# Patient Record
Sex: Male | Born: 1945 | Race: White | Hispanic: No | Smoking: Former smoker
Health system: Southern US, Community
[De-identification: ages and names within clinical notes are randomized; demographics above are authoritative.]

## PROBLEM LIST (undated history)

## (undated) DIAGNOSIS — F419 Anxiety disorder, unspecified: Secondary | ICD-10-CM

## (undated) DIAGNOSIS — E785 Hyperlipidemia, unspecified: Secondary | ICD-10-CM

## (undated) DIAGNOSIS — L409 Psoriasis, unspecified: Secondary | ICD-10-CM

## (undated) DIAGNOSIS — I10 Essential (primary) hypertension: Secondary | ICD-10-CM

## (undated) DIAGNOSIS — M109 Gout, unspecified: Secondary | ICD-10-CM

## (undated) HISTORY — DX: Essential (primary) hypertension: I10

## (undated) HISTORY — PX: TONSILLECTOMY: SUR1361

## (undated) HISTORY — PX: VASECTOMY: SHX75

## (undated) HISTORY — DX: Hyperlipidemia, unspecified: E78.5

---

## 2013-04-13 ENCOUNTER — Ambulatory Visit (INDEPENDENT_AMBULATORY_CARE_PROVIDER_SITE_OTHER): Payer: Medicare Other | Admitting: Nurse Practitioner

## 2013-04-13 ENCOUNTER — Encounter: Payer: Self-pay | Admitting: Nurse Practitioner

## 2013-04-13 VITALS — BP 138/83 | HR 67 | Temp 97.0°F | Ht 73.0 in | Wt 294.0 lb

## 2013-04-13 DIAGNOSIS — E785 Hyperlipidemia, unspecified: Secondary | ICD-10-CM

## 2013-04-13 DIAGNOSIS — I10 Essential (primary) hypertension: Secondary | ICD-10-CM | POA: Insufficient documentation

## 2013-04-13 LAB — COMPLETE METABOLIC PANEL WITH GFR
AST: 22 U/L (ref 0–37)
Albumin: 4.4 g/dL (ref 3.5–5.2)
BUN: 7 mg/dL (ref 6–23)
Calcium: 9.9 mg/dL (ref 8.4–10.5)
Chloride: 95 mEq/L — ABNORMAL LOW (ref 96–112)
GFR, Est Non African American: 73 mL/min
Glucose, Bld: 99 mg/dL (ref 70–99)
Potassium: 4.6 mEq/L (ref 3.5–5.3)

## 2013-04-13 MED ORDER — PRAVASTATIN SODIUM 20 MG PO TABS: 20.0000 mg | ORAL_TABLET | Freq: Every day | ORAL | Status: DC

## 2013-04-13 MED ORDER — METOPROLOL SUCCINATE ER 50 MG PO TB24: 50.0000 mg | ORAL_TABLET | Freq: Every day | ORAL | Status: DC

## 2013-04-13 MED ORDER — LISINOPRIL-HYDROCHLOROTHIAZIDE 20-12.5 MG PO TABS: 1.0000 | ORAL_TABLET | Freq: Every day | ORAL | Status: DC

## 2013-04-13 NOTE — Progress Notes (Signed)
  Subjective:    Patient ID: EITAN DOUBLEDAY, male    DOB: Dec 12, 1946, 67 y.o.   MRN: [id number]  Hypertension This is a chronic problem. The current episode started more than 1 year ago. The problem is unchanged. The problem is controlled. Pertinent negatives include no blurred vision, chest pain, headaches, neck pain, orthopnea, palpitations, peripheral edema, shortness of breath or sweats. There are no associated agents to hypertension. Risk factors for coronary artery disease include obesity, male gender and dyslipidemia. Past treatments include ACE inhibitors, diuretics and beta blockers. The current treatment provides significant improvement. Compliance problems include diet and exercise.   Hyperlipidemia This is a chronic problem. The current episode started more than 1 year ago. The problem is uncontrolled. Recent lipid tests were reviewed and are high. There are no known factors aggravating his hyperlipidemia. Pertinent negatives include no chest pain, focal weakness, leg pain, myalgias or shortness of breath. Current antihyperlipidemic treatment includes statins. The current treatment provides moderate improvement of lipids. Compliance problems include adherence to diet and adherence to exercise.  Risk factors for coronary artery disease include hypertension, male sex and obesity.      Review of Systems  Constitutional: Negative.   HENT: Negative for neck pain.   Eyes: Negative.  Negative for blurred vision.  Respiratory: Negative for shortness of breath.   Cardiovascular: Negative for chest pain, palpitations and orthopnea.  Genitourinary: Negative.   Musculoskeletal: Negative.  Negative for myalgias.  Neurological: Negative for focal weakness and headaches.  Psychiatric/Behavioral: Negative.        Objective:   Physical Exam  Constitutional: He is oriented to person, place, and time. He appears well-developed and well-nourished.  HENT:  Head: Normocephalic.  Right Ear:  External ear normal.  Left Ear: External ear normal.  Nose: Nose normal.  Mouth/Throat: Oropharynx is clear and moist.  Eyes: EOM are normal. Pupils are equal, round, and reactive to light.  Neck: Normal range of motion. Neck supple. No JVD present. Carotid bruit is not present. No thyromegaly present.  Lipoma left proximal clavicle area. No nodules palpable. Nontender   Cardiovascular: Normal rate, regular rhythm, normal heart sounds and intact distal pulses.   No murmur heard. Pulmonary/Chest: Effort normal and breath sounds normal. He has no wheezes. He has no rales.  Abdominal: Soft. Bowel sounds are normal.  Musculoskeletal: Normal range of motion.  Neurological: He is alert and oriented to person, place, and time.  Skin: Skin is warm and dry.  Psychiatric: He has a normal mood and affect. His behavior is normal. Judgment and thought content normal.   BP 138/83  Pulse 67  Temp(Src) 97 F (36.1 C) (Oral)  Ht 6\' 1"  (1.854 m)  Wt 294 lb (133.358 kg)  BMI 38.8 kg/m2        Assessment & Plan:  1. Essential hypertension, benign Low Na+ diet - metoprolol succinate (TOPROL-XL) 50 MG 24 hr tablet; Take 1 tablet (50 mg total) by mouth daily. Take with or immediately following a meal.  Dispense: 90 tablet; Refill: 1 - lisinopril-hydrochlorothiazide (PRINZIDE,ZESTORETIC) 20-12.5 MG per tablet; Take 1 tablet by mouth daily.  Dispense: 90 tablet; Refill: 1 - COMPLETE METABOLIC PANEL WITH GFR  2. Hyperlipidemia with target LDL less than 100 Low fat diet and exercise - pravastatin (PRAVACHOL) 20 MG tablet; Take 1 tablet (20 mg total) by mouth daily.  Dispense: 90 tablet; Refill: 1 - NMR Lipoprofile with Lipids  Mary-Margaret Daphine Deutscher, FNP

## 2013-04-13 NOTE — Patient Instructions (Signed)

## 2013-04-14 LAB — NMR LIPOPROFILE WITH LIPIDS
Cholesterol, Total: 172 mg/dL
HDL Particle Number: 29 umol/L — ABNORMAL LOW
HDL Size: 8.9 nm — ABNORMAL LOW
HDL-C: 45 mg/dL
LDL (calc): 109 mg/dL — ABNORMAL HIGH
LDL Particle Number: 1465 nmol/L — ABNORMAL HIGH
LDL Size: 21.1 nm
LP-IR Score: 47 — ABNORMAL HIGH
Large HDL-P: 4.4 umol/L — ABNORMAL LOW
Large VLDL-P: 3.2 nmol/L — ABNORMAL HIGH
Small LDL Particle Number: 669 nmol/L — ABNORMAL HIGH
Triglycerides: 91 mg/dL
VLDL Size: 43.9 nm

## 2013-09-30 ENCOUNTER — Encounter (INDEPENDENT_AMBULATORY_CARE_PROVIDER_SITE_OTHER): Payer: Self-pay

## 2013-09-30 ENCOUNTER — Ambulatory Visit (INDEPENDENT_AMBULATORY_CARE_PROVIDER_SITE_OTHER): Payer: Medicare Other | Admitting: Nurse Practitioner

## 2013-09-30 ENCOUNTER — Encounter: Payer: Self-pay | Admitting: Nurse Practitioner

## 2013-09-30 VITALS — BP 146/91 | HR 60 | Temp 97.0°F | Ht 73.0 in | Wt 285.0 lb

## 2013-09-30 DIAGNOSIS — I10 Essential (primary) hypertension: Secondary | ICD-10-CM

## 2013-09-30 DIAGNOSIS — E785 Hyperlipidemia, unspecified: Secondary | ICD-10-CM

## 2013-09-30 DIAGNOSIS — Z125 Encounter for screening for malignant neoplasm of prostate: Secondary | ICD-10-CM

## 2013-09-30 MED ORDER — METOPROLOL SUCCINATE ER 50 MG PO TB24: 50.0000 mg | ORAL_TABLET | Freq: Every day | ORAL | Status: DC

## 2013-09-30 MED ORDER — LISINOPRIL-HYDROCHLOROTHIAZIDE 20-12.5 MG PO TABS: 1.0000 | ORAL_TABLET | Freq: Every day | ORAL | Status: DC

## 2013-09-30 MED ORDER — PRAVASTATIN SODIUM 20 MG PO TABS: 20.0000 mg | ORAL_TABLET | Freq: Every day | ORAL | Status: DC

## 2013-09-30 NOTE — Progress Notes (Signed)
Subjective:    Patient ID: EBONY RICKEL, male    DOB: 03-29-1946, 67 y.o.   MRN: [id number]  Hypertension This is a chronic problem. The current episode started more than 1 year ago. The problem is unchanged. The problem is controlled. Pertinent negatives include no blurred vision, headaches, neck pain, orthopnea, peripheral edema or sweats. There are no associated agents to hypertension. Risk factors for coronary artery disease include obesity, male gender and dyslipidemia. Past treatments include ACE inhibitors, diuretics and beta blockers. The current treatment provides significant improvement. Compliance problems include diet and exercise.   Hyperlipidemia This is a chronic problem. The current episode started more than 1 year ago. The problem is uncontrolled. Recent lipid tests were reviewed and are high. There are no known factors aggravating his hyperlipidemia. Pertinent negatives include no focal weakness, leg pain or myalgias. Current antihyperlipidemic treatment includes statins. The current treatment provides moderate improvement of lipids. Compliance problems include adherence to diet and adherence to exercise.  Risk factors for coronary artery disease include hypertension, male sex and obesity.  Atrial Fibrillation Symptoms include hypertension. Past medical history includes atrial fibrillation and hyperlipidemia.      Review of Systems  Constitutional: Negative.   Eyes: Negative.  Negative for blurred vision.  Cardiovascular: Negative for orthopnea.  Genitourinary: Negative.   Musculoskeletal: Negative.  Negative for myalgias and neck pain.  Neurological: Negative for focal weakness and headaches.  Psychiatric/Behavioral: Negative.        Objective:   Physical Exam  Constitutional: He is oriented to person, place, and time. He appears well-developed and well-nourished.  HENT:  Head: Normocephalic.  Right Ear: External ear normal.  Left Ear: External ear normal.  Nose:  Nose normal.  Mouth/Throat: Oropharynx is clear and moist.  Eyes: EOM are normal. Pupils are equal, round, and reactive to light.  Neck: Normal range of motion. Neck supple. No JVD present. Carotid bruit is not present. No thyromegaly present.  Lipoma left proximal clavicle area. No nodules palpable. Nontender   Cardiovascular: Normal rate, regular rhythm, normal heart sounds and intact distal pulses.   No murmur heard. Pulmonary/Chest: Effort normal and breath sounds normal. He has no wheezes. He has no rales.  Abdominal: Soft. Bowel sounds are normal.  Genitourinary:  Refuses rectal exam   Musculoskeletal: Normal range of motion.  Neurological: He is alert and oriented to person, place, and time.  Skin: Skin is warm and dry.  Psychiatric: He has a normal mood and affect. His behavior is normal. Judgment and thought content normal.   BP 146/91  Pulse 60  Temp(Src) 97 F (36.1 C) (Oral)  Ht 6\' 1"  (1.854 m)  Wt 285 lb (129.275 kg)  BMI 37.61 kg/m2        Assessment & Plan:   1. Essential hypertension, benign   2. Hyperlipidemia with target LDL less than 100   3. Prostate cancer screening    Orders Placed This Encounter  Procedures  . CMP14+EGFR  . NMR, lipoprofile  . PSA, total and free   Meds ordered this encounter  Medications  . metoprolol succinate (TOPROL-XL) 50 MG 24 hr tablet    Sig: Take 1 tablet (50 mg total) by mouth daily. Take with or immediately following a meal.    Dispense:  90 tablet    Refill:  1    Order Specific Question:  Supervising Provider    Answer:  Ernestina Penna [[id number]]  . pravastatin (PRAVACHOL) 20 MG tablet    Sig: Take  1 tablet (20 mg total) by mouth daily.    Dispense:  90 tablet    Refill:  1    Order Specific Question:  Supervising Provider    Answer:  Ernestina Penna [[id number]]  . lisinopril-hydrochlorothiazide (PRINZIDE,ZESTORETIC) 20-12.5 MG per tablet    Sig: Take 1 tablet by mouth daily.    Dispense:  90 tablet    Refill:  1     Order Specific Question:  Supervising Provider    Answer:  Deborra Medina    Continue all meds Labs pending Diet and exercise encouraged Health maintenance reviewed Follow up in 3 months Patient refuses all immunizations  Mary-Margaret Daphine Deutscher, FNP

## 2013-09-30 NOTE — Patient Instructions (Signed)
Health Maintenance, Males A healthy lifestyle and preventative care can promote health and wellness.  Maintain regular health, dental, and eye exams.  Eat a healthy diet. Foods like vegetables, fruits, whole grains, low-fat dairy products, and lean protein foods contain the nutrients you need without too many calories. Decrease your intake of foods high in solid fats, added sugars, and salt. Get information about a proper diet from your caregiver, if necessary.  Regular physical exercise is one of the most important things you can do for your health. Most adults should get at least 150 minutes of moderate-intensity exercise (any activity that increases your heart rate and causes you to sweat) each week. In addition, most adults need muscle-strengthening exercises on 2 or more days a week.   Maintain a healthy weight. The body mass index (BMI) is a screening tool to identify possible weight problems. It provides an estimate of body fat based on height and weight. Your caregiver can help determine your BMI, and can help you achieve or maintain a healthy weight. For adults 20 years and older:  A BMI below 18.5 is considered underweight.  A BMI of 18.5 to 24.9 is normal.  A BMI of 25 to 29.9 is considered overweight.  A BMI of 30 and above is considered obese.  Maintain normal blood lipids and cholesterol by exercising and minimizing your intake of saturated fat. Eat a balanced diet with plenty of fruits and vegetables. Blood tests for lipids and cholesterol should begin at age 20 and be repeated every 5 years. If your lipid or cholesterol levels are high, you are over 50, or you are a high risk for heart disease, you may need your cholesterol levels checked more frequently.Ongoing high lipid and cholesterol levels should be treated with medicines, if diet and exercise are not effective.  If you smoke, find out from your caregiver how to quit. If you do not use tobacco, do not start.  If you  choose to drink alcohol, do not exceed 2 drinks per day. One drink is considered to be 12 ounces (355 mL) of beer, 5 ounces (148 mL) of wine, or 1.5 ounces (44 mL) of liquor.  Avoid use of street drugs. Do not share needles with anyone. Ask for help if you need support or instructions about stopping the use of drugs.  High blood pressure causes heart disease and increases the risk of stroke. Blood pressure should be checked at least every 1 to 2 years. Ongoing high blood pressure should be treated with medicines if weight loss and exercise are not effective.  If you are 45 to 66 years old, ask your caregiver if you should take aspirin to prevent heart disease.  Diabetes screening involves taking a blood sample to check your fasting blood sugar level. This should be done once every 3 years, after age 45, if you are within normal weight and without risk factors for diabetes. Testing should be considered at a younger age or be carried out more frequently if you are overweight and have at least 1 risk factor for diabetes.  Colorectal cancer can be detected and often prevented. Most routine colorectal cancer screening begins at the age of 50 and continues through age 75. However, your caregiver may recommend screening at an earlier age if you have risk factors for colon cancer. On a yearly basis, your caregiver may provide home test kits to check for hidden blood in the stool. Use of a small camera at the end of a tube,   to directly examine the colon (sigmoidoscopy or colonoscopy), can detect the earliest forms of colorectal cancer. Talk to your caregiver about this at age 50, when routine screening begins. Direct examination of the colon should be repeated every 5 to 10 years through age 75, unless early forms of pre-cancerous polyps or small growths are found.  Hepatitis C blood testing is recommended for all people born from 1945 through 1965 and any individual with known risks for hepatitis C.  Healthy  men should no longer receive prostate-specific antigen (PSA) blood tests as part of routine cancer screening. Consult with your caregiver about prostate cancer screening.  Testicular cancer screening is not recommended for adolescents or adult males who have no symptoms. Screening includes self-exam, caregiver exam, and other screening tests. Consult with your caregiver about any symptoms you have or any concerns you have about testicular cancer.  Practice safe sex. Use condoms and avoid high-risk sexual practices to reduce the spread of sexually transmitted infections (STIs).  Use sunscreen with a sun protection factor (SPF) of 30 or greater. Apply sunscreen liberally and repeatedly throughout the day. You should seek shade when your shadow is shorter than you. Protect yourself by wearing long sleeves, pants, a wide-brimmed hat, and sunglasses year round, whenever you are outdoors.  Notify your caregiver of new moles or changes in moles, especially if there is a change in shape or color. Also notify your caregiver if a mole is larger than the size of a pencil eraser.  A one-time screening for abdominal aortic aneurysm (AAA) and surgical repair of large AAAs by sound wave imaging (ultrasonography) is recommended for ages 65 to 75 years who are current or former smokers.  Stay current with your immunizations. Document Released: 05/31/2008 Document Revised: 02/25/2012 Document Reviewed: 04/30/2011 [employer] Patient Information 2014 [employer].  

## 2013-10-02 LAB — CMP14+EGFR
AST: 35 IU/L (ref 0–40)
Albumin/Globulin Ratio: 1.9 (ref 1.1–2.5)
Alkaline Phosphatase: 61 IU/L (ref 39–117)
BUN/Creatinine Ratio: 9 — ABNORMAL LOW (ref 10–22)
CO2: 23 mmol/L (ref 18–29)
Creatinine, Ser: 1.04 mg/dL (ref 0.76–1.27)
GFR calc Af Amer: 86 mL/min/{1.73_m2} (ref >59)
Globulin, Total: 2.5 g/dL (ref 1.5–4.5)
Sodium: 130 mmol/L — ABNORMAL LOW (ref 134–144)
Total Bilirubin: 0.7 mg/dL (ref 0.0–1.2)

## 2013-10-02 LAB — NMR, LIPOPROFILE
Cholesterol: 172 mg/dL (ref <200)
HDL Particle Number: 35.1 umol/L (ref >=30.5)
LDL Size: 20.9 nm (ref >20.5)
LDLC SERPL CALC-MCNC: 104 mg/dL — ABNORMAL HIGH (ref <100)
LP-IR Score: 77 — ABNORMAL HIGH (ref <=45)
Small LDL Particle Number: 835 nmol/L — ABNORMAL HIGH (ref <=527)

## 2013-10-02 LAB — PSA, TOTAL AND FREE: PSA: 0.6 ng/mL (ref 0.0–4.0)

## 2013-10-12 ENCOUNTER — Ambulatory Visit: Payer: Medicare Other | Admitting: Nurse Practitioner

## 2013-11-23 ENCOUNTER — Telehealth: Payer: Self-pay | Admitting: Nurse Practitioner

## 2013-11-23 NOTE — Telephone Encounter (Signed)
mucinex OTC- hasn't been seen in office lately

## 2013-11-23 NOTE — Telephone Encounter (Signed)
Please advise 

## 2013-11-24 NOTE — Telephone Encounter (Signed)
Patient aware.

## 2014-01-01 ENCOUNTER — Encounter: Payer: Self-pay | Admitting: Nurse Practitioner

## 2014-01-01 ENCOUNTER — Ambulatory Visit (INDEPENDENT_AMBULATORY_CARE_PROVIDER_SITE_OTHER): Payer: Medicare Other | Admitting: Nurse Practitioner

## 2014-01-01 VITALS — BP 149/85 | HR 74 | Temp 97.0°F | Ht 73.0 in | Wt 271.0 lb

## 2014-01-01 DIAGNOSIS — M545 Low back pain, unspecified: Secondary | ICD-10-CM

## 2014-01-01 DIAGNOSIS — E785 Hyperlipidemia, unspecified: Secondary | ICD-10-CM

## 2014-01-01 DIAGNOSIS — I10 Essential (primary) hypertension: Secondary | ICD-10-CM

## 2014-01-01 MED ORDER — CYCLOBENZAPRINE HCL 5 MG PO TABS: 5.0000 mg | ORAL_TABLET | Freq: Every day | ORAL | Status: DC

## 2014-01-01 NOTE — Progress Notes (Signed)
Subjective:    Patient ID: Daniel Barnett, male    DOB: Apr 16, 1946, 68 y.o.   MRN: [id number]  Patient in today for regular follow up- he is doing well. Only complaint is back pain- Tripped and fell 3 months ago and says now he can't sleep in the bed because it hurts his back- That is the only thing that aggravetes his back- During the day it is fine.  Hypertension This is a chronic problem. The current episode started more than 1 year ago. The problem is unchanged. The problem is controlled. Pertinent negatives include no blurred vision, headaches, neck pain, orthopnea, peripheral edema or sweats. There are no associated agents to hypertension. Risk factors for coronary artery disease include obesity, male gender and dyslipidemia. Past treatments include ACE inhibitors, diuretics and beta blockers. The current treatment provides significant improvement. Compliance problems include diet and exercise.   Hyperlipidemia This is a chronic problem. The current episode started more than 1 year ago. The problem is uncontrolled. Recent lipid tests were reviewed and are high. There are no known factors aggravating his hyperlipidemia. Pertinent negatives include no focal weakness, leg pain or myalgias. Current antihyperlipidemic treatment includes statins. The current treatment provides moderate improvement of lipids. Compliance problems include adherence to diet and adherence to exercise.  Risk factors for coronary artery disease include hypertension, male sex and obesity.  Atrial Fibrillation Symptoms include hypertension. Past medical history includes atrial fibrillation and hyperlipidemia.      Review of Systems  Constitutional: Negative.   Eyes: Negative.  Negative for blurred vision.  Cardiovascular: Negative for orthopnea.  Genitourinary: Negative.   Musculoskeletal: Negative.  Negative for myalgias and neck pain.  Neurological: Negative for focal weakness and headaches.  Psychiatric/Behavioral:  Negative.        Objective:   Physical Exam  Constitutional: He is oriented to person, place, and time. He appears well-developed and well-nourished.  HENT:  Head: Normocephalic.  Right Ear: External ear normal.  Left Ear: External ear normal.  Nose: Nose normal.  Mouth/Throat: Oropharynx is clear and moist.  Eyes: EOM are normal. Pupils are equal, round, and reactive to light.  Neck: Normal range of motion. Neck supple. No JVD present. Carotid bruit is not present. No thyromegaly present.  Lipoma left proximal clavicle area. No nodules palpable. Nontender   Cardiovascular: Normal rate, regular rhythm, normal heart sounds and intact distal pulses.   No murmur heard. Pulmonary/Chest: Effort normal and breath sounds normal. He has no wheezes. He has no rales.  Abdominal: Soft. Bowel sounds are normal.  Genitourinary:  Refuses rectal exam   Musculoskeletal: Normal range of motion.  Neurological: He is alert and oriented to person, place, and time.  Skin: Skin is warm and dry.  Psychiatric: He has a normal mood and affect. His behavior is normal. Judgment and thought content normal.   BP 149/85  Pulse 74  Temp(Src) 97 F (36.1 C) (Oral)  Ht '6\' 1"'  (1.854 m)  Wt 271 lb (122.925 kg)  BMI 35.76 kg/m2        Assessment & Plan:   1. Hyperlipidemia with target LDL less than 100   2. Essential hypertension, benign   3. Low back pain    Orders Placed This Encounter  Procedures  . CMP14+EGFR  . NMR, lipoprofile   Meds ordered this encounter  Medications  . cyclobenzaprine (FLEXERIL) 5 MG tablet    Sig: Take 1 tablet (5 mg total) by mouth at bedtime.    Dispense:  30  tablet    Refill:  1    Order Specific Question:  Supervising Provider    Answer:  Chipper Herb [[id number]]   Moist heat for back- no lifting Labs pending Health maintenance reviewed Diet and exercise encouraged Continue all meds Follow up  In 3 months  [place], FNP

## 2014-01-01 NOTE — Patient Instructions (Signed)
Back Pain, Adult Low back pain is very common. About 1 in 5 people have back pain.The cause of low back pain is rarely dangerous. The pain often gets better over time.About half of people with a sudden onset of back pain feel better in just 2 weeks. About 8 in 10 people feel better by 6 weeks.  CAUSES Some common causes of back pain include:  Strain of the muscles or ligaments supporting the spine.  Wear and tear (degeneration) of the spinal discs.  Arthritis.  Direct injury to the back. DIAGNOSIS Most of the time, the direct cause of low back pain is not known.However, back pain can be treated effectively even when the exact cause of the pain is unknown.Answering your caregiver's questions about your overall health and symptoms is one of the most accurate ways to make sure the cause of your pain is not dangerous. If your caregiver needs more information, he or she may order lab work or imaging tests (X-rays or MRIs).However, even if imaging tests show changes in your back, this usually does not require surgery. HOME CARE INSTRUCTIONS For many people, back pain returns.Since low back pain is rarely dangerous, it is often a condition that people can learn to manageon their own.   Remain active. It is stressful on the back to sit or stand in one place. Do not sit, drive, or stand in one place for more than 30 minutes at a time. Take short walks on level surfaces as soon as pain allows.Try to increase the length of time you walk each day.  Do not stay in bed.Resting more than 1 or 2 days can delay your recovery.  Do not avoid exercise or work.Your body is made to move.It is not dangerous to be active, even though your back may hurt.Your back will likely heal faster if you return to being active before your pain is gone.  Pay attention to your body when you bend and lift. Many people have less discomfortwhen lifting if they bend their knees, keep the load close to their bodies,and  avoid twisting. Often, the most comfortable positions are those that put less stress on your recovering back.  Find a comfortable position to sleep. Use a firm mattress and lie on your side with your knees slightly bent. If you lie on your back, put a pillow under your knees.  Only take over-the-counter or prescription medicines as directed by your caregiver. Over-the-counter medicines to reduce pain and inflammation are often the most helpful.Your caregiver may prescribe muscle relaxant drugs.These medicines help dull your pain so you can more quickly return to your normal activities and healthy exercise.  Put ice on the injured area.  Put ice in a plastic bag.  Place a towel between your skin and the bag.  Leave the ice on for 15-20 minutes, 03-04 times a day for the first 2 to 3 days. After that, ice and heat may be alternated to reduce pain and spasms.  Ask your caregiver about trying back exercises and gentle massage. This may be of some benefit.  Avoid feeling anxious or stressed.Stress increases muscle tension and can worsen back pain.It is important to recognize when you are anxious or stressed and learn ways to manage it.Exercise is a great option. SEEK MEDICAL CARE IF:  You have pain that is not relieved with rest or medicine.  You have pain that does not improve in 1 week.  You have new symptoms.  You are generally not feeling well. SEEK   IMMEDIATE MEDICAL CARE IF:   You have pain that radiates from your back into your legs.  You develop new bowel or bladder control problems.  You have unusual weakness or numbness in your arms or legs.  You develop nausea or vomiting.  You develop abdominal pain.  You feel faint. Document Released: 12/03/2005 Document Revised: 06/03/2012 Document Reviewed: 04/23/2011 [employer] Patient Information 2014 [employer].  

## 2014-01-03 LAB — CMP14+EGFR
A/G RATIO: 1.8 (ref 1.1–2.5)
ALBUMIN: 4.8 g/dL (ref 3.6–4.8)
ALT: 40 IU/L (ref 0–44)
AST: 31 IU/L (ref 0–40)
Alkaline Phosphatase: 72 IU/L (ref 39–117)
BILIRUBIN TOTAL: 0.6 mg/dL (ref 0.0–1.2)
BUN/Creatinine Ratio: 10 (ref 10–22)
BUN: 10 mg/dL (ref 8–27)
CALCIUM: 10 mg/dL (ref 8.6–10.2)
CO2: 24 mmol/L (ref 18–29)
CREATININE: 1.02 mg/dL (ref 0.76–1.27)
Chloride: 90 mmol/L — ABNORMAL LOW (ref 97–108)
GFR, EST AFRICAN AMERICAN: 88 mL/min/{1.73_m2} (ref >59)
GFR, EST NON AFRICAN AMERICAN: 76 mL/min/{1.73_m2} (ref >59)
GLOBULIN, TOTAL: 2.6 g/dL (ref 1.5–4.5)
GLUCOSE: 77 mg/dL (ref 65–99)
Potassium: 4.7 mmol/L (ref 3.5–5.2)
Sodium: 133 mmol/L — ABNORMAL LOW (ref 134–144)
TOTAL PROTEIN: 7.4 g/dL (ref 6.0–8.5)

## 2014-01-03 LAB — NMR, LIPOPROFILE
CHOLESTEROL: 156 mg/dL (ref <200)
HDL Cholesterol by NMR: 50 mg/dL (ref >=40)
HDL Particle Number: 30.3 umol/L — ABNORMAL LOW (ref >=30.5)
LDL Particle Number: 993 nmol/L (ref <1000)
LDL Size: 21.1 nm (ref >20.5)
LDLC SERPL CALC-MCNC: 69 mg/dL (ref <100)
LP-IR Score: 44 (ref <=45)
SMALL LDL PARTICLE NUMBER: 380 nmol/L (ref <=527)
TRIGLYCERIDES BY NMR: 186 mg/dL — AB (ref <150)

## 2014-04-02 ENCOUNTER — Ambulatory Visit (INDEPENDENT_AMBULATORY_CARE_PROVIDER_SITE_OTHER): Payer: Medicare Other

## 2014-04-02 ENCOUNTER — Encounter: Payer: Self-pay | Admitting: Nurse Practitioner

## 2014-04-02 ENCOUNTER — Ambulatory Visit (INDEPENDENT_AMBULATORY_CARE_PROVIDER_SITE_OTHER): Payer: Medicare Other | Admitting: Nurse Practitioner

## 2014-04-02 VITALS — BP 151/85 | HR 59 | Temp 98.1°F | Ht 73.0 in | Wt 269.8 lb

## 2014-04-02 DIAGNOSIS — Z713 Dietary counseling and surveillance: Secondary | ICD-10-CM

## 2014-04-02 DIAGNOSIS — F17200 Nicotine dependence, unspecified, uncomplicated: Secondary | ICD-10-CM

## 2014-04-02 DIAGNOSIS — I10 Essential (primary) hypertension: Secondary | ICD-10-CM

## 2014-04-02 DIAGNOSIS — Z6830 Body mass index (BMI) 30.0-30.9, adult: Secondary | ICD-10-CM | POA: Insufficient documentation

## 2014-04-02 DIAGNOSIS — G4700 Insomnia, unspecified: Secondary | ICD-10-CM

## 2014-04-02 DIAGNOSIS — E785 Hyperlipidemia, unspecified: Secondary | ICD-10-CM

## 2014-04-02 DIAGNOSIS — Z6835 Body mass index (BMI) 35.0-35.9, adult: Secondary | ICD-10-CM

## 2014-04-02 MED ORDER — ALPRAZOLAM 0.25 MG PO TABS: 0.2500 mg | ORAL_TABLET | Freq: Every evening | ORAL | Status: DC | PRN

## 2014-04-02 MED ORDER — LISINOPRIL-HYDROCHLOROTHIAZIDE 20-12.5 MG PO TABS: 1.0000 | ORAL_TABLET | Freq: Every day | ORAL | Status: DC

## 2014-04-02 MED ORDER — METOPROLOL SUCCINATE ER 50 MG PO TB24: 50.0000 mg | ORAL_TABLET | Freq: Every day | ORAL | Status: DC

## 2014-04-02 MED ORDER — PRAVASTATIN SODIUM 20 MG PO TABS: 20.0000 mg | ORAL_TABLET | Freq: Every day | ORAL | Status: DC

## 2014-04-02 NOTE — Progress Notes (Signed)
Subjective:    Patient ID: Daniel Barnett, male    DOB: 1946-11-10, 68 y.o.   MRN: [id number]  Patinet here today for follow up of chronic medical problems- He is c/o not sleeping weel- Says that he worries a lot which keeps him up at night. He has had depression for years but refuses medication. Lamonte Sakai staken a xanax that someone gave him which helped a lot with his sleeping.  Hypertension This is a chronic problem. The current episode started more than 1 year ago. The problem is unchanged. The problem is controlled. Pertinent negatives include no blurred vision, chest pain, headaches, neck pain, orthopnea, palpitations, peripheral edema, shortness of breath or sweats. There are no associated agents to hypertension. Risk factors for coronary artery disease include obesity, male gender and dyslipidemia. Past treatments include ACE inhibitors, diuretics and beta blockers. The current treatment provides significant improvement. Compliance problems include diet and exercise.   Hyperlipidemia This is a chronic problem. The current episode started more than 1 year ago. The problem is uncontrolled. Recent lipid tests were reviewed and are high. There are no known factors aggravating his hyperlipidemia. Pertinent negatives include no chest pain, focal weakness, leg pain, myalgias or shortness of breath. Current antihyperlipidemic treatment includes statins. The current treatment provides moderate improvement of lipids. Compliance problems include adherence to diet and adherence to exercise.  Risk factors for coronary artery disease include hypertension, male sex and obesity.      Review of Systems  Constitutional: Negative.   Eyes: Negative.  Negative for blurred vision.  Respiratory: Negative for shortness of breath.   Cardiovascular: Negative for chest pain, palpitations and orthopnea.  Genitourinary: Negative.   Musculoskeletal: Negative.  Negative for myalgias and neck pain.  Neurological: Negative  for focal weakness and headaches.  Psychiatric/Behavioral: Negative.        Objective:   Physical Exam  Constitutional: He is oriented to person, place, and time. He appears well-developed and well-nourished.  HENT:  Head: Normocephalic.  Right Ear: External ear normal.  Left Ear: External ear normal.  Nose: Nose normal.  Mouth/Throat: Oropharynx is clear and moist.  Eyes: EOM are normal. Pupils are equal, round, and reactive to light.  Neck: Normal range of motion. Neck supple. No JVD present. Carotid bruit is not present. No thyromegaly present.  Lipoma left proximal clavicle area. No nodules palpable. Nontender   Cardiovascular: Normal rate, regular rhythm, normal heart sounds and intact distal pulses.   No murmur heard. Pulmonary/Chest: Effort normal and breath sounds normal. He has no wheezes. He has no rales.  Abdominal: Soft. Bowel sounds are normal.  Musculoskeletal: Normal range of motion.  Neurological: He is alert and oriented to person, place, and time.  Skin: Skin is warm and dry.  Psychiatric: He has a normal mood and affect. His behavior is normal. Judgment and thought content normal.   BP 151/85  Pulse 59  Temp(Src) 98.1 F (36.7 C) (Oral)  Ht '6\' 1"'  (1.854 m)  Wt 269 lb 12.8 oz (122.38 kg)  BMI 35.60 kg/m2  Chest x ray- chronic bronchitic changes-Preliminary reading by Ronnald Collum, FNP  [facility]  EKG- NSR- Mary-Margaret Hassell Done, FNP        Assessment & Plan:  1. Hyperlipidemia with target LDL less than 100 Low Na+ diet - NMR, lipoprofile - pravastatin (PRAVACHOL) 20 MG tablet; Take 1 tablet (20 mg total) by mouth daily.  Dispense: 90 tablet; Refill: 1  2. Essential hypertension, benign Low fat diet and exercise - CMP14+EGFR -  EKG 12-Lead - lisinopril-hydrochlorothiazide (PRINZIDE,ZESTORETIC) 20-12.5 MG per tablet; Take 1 tablet by mouth daily.  Dispense: 90 tablet; Refill: 1 - metoprolol succinate (TOPROL-XL) 50 MG 24 hr tablet; Take 1 tablet (50 mg  total) by mouth daily. Take with or immediately following a meal.  Dispense: 90 tablet; Refill: 1  3. BMI 35.0-35.9,adult 4. Weight loss counseling, encounter for low calorie well balanced diet discussed  5. Insomnia Bedtime ritual - ALPRAZolam (XANAX) 0.25 MG tablet; Take 1 tablet (0.25 mg total) by mouth at bedtime as needed for anxiety.  Dispense: 30 tablet; Refill: 1  6. Smoker STOP SMOKING- cessation discussed - DG Chest 2 View; Future   Continue all meds Labs pending Health Maintenance reviewed RTO 3 months  Mary-Margaret Hassell Done, FNP

## 2014-04-02 NOTE — Patient Instructions (Signed)

## 2014-04-03 LAB — CMP14+EGFR
ALT: 38 IU/L (ref 0–44)
AST: 35 IU/L (ref 0–40)
Albumin/Globulin Ratio: 1.9 (ref 1.1–2.5)
Albumin: 4.8 g/dL (ref 3.6–4.8)
Alkaline Phosphatase: 81 IU/L (ref 39–117)
BILIRUBIN TOTAL: 0.7 mg/dL (ref 0.0–1.2)
BUN / CREAT RATIO: 9 — AB (ref 10–22)
BUN: 8 mg/dL (ref 8–27)
CHLORIDE: 88 mmol/L — AB (ref 97–108)
CO2: 26 mmol/L (ref 18–29)
Calcium: 10.1 mg/dL (ref 8.6–10.2)
Creatinine, Ser: 0.91 mg/dL (ref 0.76–1.27)
GFR calc Af Amer: 101 mL/min/{1.73_m2} (ref >59)
GFR calc non Af Amer: 88 mL/min/{1.73_m2} (ref >59)
GLOBULIN, TOTAL: 2.5 g/dL (ref 1.5–4.5)
Glucose: 91 mg/dL (ref 65–99)
Potassium: 5.4 mmol/L — ABNORMAL HIGH (ref 3.5–5.2)
Sodium: 131 mmol/L — ABNORMAL LOW (ref 134–144)
Total Protein: 7.3 g/dL (ref 6.0–8.5)

## 2014-04-03 LAB — NMR, LIPOPROFILE
CHOLESTEROL: 157 mg/dL (ref <200)
HDL Cholesterol by NMR: 56 mg/dL (ref >=40)
HDL PARTICLE NUMBER: 32.2 umol/L (ref >=30.5)
LDL PARTICLE NUMBER: 831 nmol/L (ref <1000)
LDL SIZE: 20.8 nm (ref >20.5)
LDLC SERPL CALC-MCNC: 83 mg/dL (ref <100)
LP-IR SCORE: 34 (ref <=45)
Small LDL Particle Number: 301 nmol/L (ref <=527)
Triglycerides by NMR: 88 mg/dL (ref <150)

## 2014-07-07 ENCOUNTER — Encounter: Payer: Self-pay | Admitting: Nurse Practitioner

## 2014-07-07 ENCOUNTER — Ambulatory Visit (INDEPENDENT_AMBULATORY_CARE_PROVIDER_SITE_OTHER): Payer: Medicare Other | Admitting: Nurse Practitioner

## 2014-07-07 VITALS — BP 150/88 | HR 68 | Temp 97.5°F | Ht 73.0 in | Wt 268.0 lb

## 2014-07-07 DIAGNOSIS — Z713 Dietary counseling and surveillance: Secondary | ICD-10-CM

## 2014-07-07 DIAGNOSIS — E785 Hyperlipidemia, unspecified: Secondary | ICD-10-CM

## 2014-07-07 DIAGNOSIS — Z6835 Body mass index (BMI) 35.0-35.9, adult: Secondary | ICD-10-CM

## 2014-07-07 DIAGNOSIS — I10 Essential (primary) hypertension: Secondary | ICD-10-CM

## 2014-07-07 DIAGNOSIS — F411 Generalized anxiety disorder: Secondary | ICD-10-CM | POA: Insufficient documentation

## 2014-07-07 DIAGNOSIS — G4700 Insomnia, unspecified: Secondary | ICD-10-CM

## 2014-07-07 MED ORDER — ALPRAZOLAM 0.25 MG PO TABS: 0.2500 mg | ORAL_TABLET | Freq: Every evening | ORAL | Status: DC | PRN

## 2014-07-07 MED ORDER — PRAVASTATIN SODIUM 20 MG PO TABS: 20.0000 mg | ORAL_TABLET | Freq: Every day | ORAL | Status: DC

## 2014-07-07 MED ORDER — METOPROLOL SUCCINATE ER 50 MG PO TB24: 50.0000 mg | ORAL_TABLET | Freq: Every day | ORAL | Status: DC

## 2014-07-07 MED ORDER — LISINOPRIL-HYDROCHLOROTHIAZIDE 20-12.5 MG PO TABS: 1.0000 | ORAL_TABLET | Freq: Every day | ORAL | Status: DC

## 2014-07-07 NOTE — Progress Notes (Signed)
Subjective:    Patient ID: Daniel Barnett, male    DOB: 1946-07-23, 68 y.o.   MRN: [id number]  Patinet here today for follow up of chronic medical problems- no complaints today  Hypertension This is a chronic problem. The current episode started more than 1 year ago. The problem is unchanged. The problem is controlled. Pertinent negatives include no blurred vision, chest pain, headaches, neck pain, orthopnea, palpitations, peripheral edema, shortness of breath or sweats. There are no associated agents to hypertension. Risk factors for coronary artery disease include obesity, male gender and dyslipidemia. Past treatments include ACE inhibitors, diuretics and beta blockers. The current treatment provides significant improvement. Compliance problems include diet and exercise.   Hyperlipidemia This is a chronic problem. The current episode started more than 1 year ago. The problem is uncontrolled. Recent lipid tests were reviewed and are high. There are no known factors aggravating his hyperlipidemia. Pertinent negatives include no chest pain, focal weakness, leg pain, myalgias or shortness of breath. Current antihyperlipidemic treatment includes statins. The current treatment provides moderate improvement of lipids. Compliance problems include adherence to diet and adherence to exercise.  Risk factors for coronary artery disease include hypertension, male sex and obesity.  Insomnia Xanax at night helps him to rest.    Review of Systems  Constitutional: Negative.   Eyes: Negative.  Negative for blurred vision.  Respiratory: Negative for shortness of breath.   Cardiovascular: Negative for chest pain, palpitations and orthopnea.  Genitourinary: Negative.   Musculoskeletal: Negative.  Negative for myalgias and neck pain.  Neurological: Negative for focal weakness and headaches.  Psychiatric/Behavioral: Negative.        Objective:   Physical Exam  Constitutional: He is oriented to person,  place, and time. He appears well-developed and well-nourished.  HENT:  Head: Normocephalic.  Right Ear: External ear normal.  Left Ear: External ear normal.  Nose: Nose normal.  Mouth/Throat: Oropharynx is clear and moist.  Eyes: EOM are normal. Pupils are equal, round, and reactive to light.  Neck: Normal range of motion. Neck supple. No JVD present. Carotid bruit is not present. No thyromegaly present.  Lipoma left proximal clavicle area. No nodules palpable. Nontender   Cardiovascular: Normal rate, regular rhythm, normal heart sounds and intact distal pulses.   No murmur heard. Pulmonary/Chest: Effort normal and breath sounds normal. He has no wheezes. He has no rales.  Abdominal: Soft. Bowel sounds are normal.  Musculoskeletal: Normal range of motion.  Neurological: He is alert and oriented to person, place, and time.  Skin: Skin is warm and dry.  Psychiatric: He has a normal mood and affect. His behavior is normal. Judgment and thought content normal.   BP 150/88  Pulse 68  Temp(Src) 97.5 F (36.4 C) (Oral)  Ht '6\' 1"'  (1.854 m)  Wt 268 lb (121.564 kg)  BMI 35.37 kg/m2        Assessment & Plan:   1. Insomnia   2. Hyperlipidemia with target LDL less than 100   3. Essential hypertension, benign   4. BMI 35.0-35.9,adult   5. Weight loss counseling, encounter for    Orders Placed This Encounter  Procedures  . CMP14+EGFR  . NMR, lipoprofile   Meds ordered this encounter  Medications  . pravastatin (PRAVACHOL) 20 MG tablet    Sig: Take 1 tablet (20 mg total) by mouth daily.    Dispense:  90 tablet    Refill:  1    Order Specific Question:  Supervising Provider  Answer:  Chipper Herb [[id number]]  . metoprolol succinate (TOPROL-XL) 50 MG 24 hr tablet    Sig: Take 1 tablet (50 mg total) by mouth daily. Take with or immediately following a meal.    Dispense:  90 tablet    Refill:  1    Order Specific Question:  Supervising Provider    Answer:  Chipper Herb [[id number]]   . lisinopril-hydrochlorothiazide (PRINZIDE,ZESTORETIC) 20-12.5 MG per tablet    Sig: Take 1 tablet by mouth daily.    Dispense:  90 tablet    Refill:  1    Order Specific Question:  Supervising Provider    Answer:  Chipper Herb [[id number]]    Labs pending Health maintenance reviewed Diet and exercise encouraged Continue all meds Follow up  In 3 months   [place], FNP

## 2014-07-07 NOTE — Patient Instructions (Signed)

## 2014-07-08 LAB — CMP14+EGFR
A/G RATIO: 1.7 (ref 1.1–2.5)
ALT: 38 IU/L (ref 0–44)
AST: 31 IU/L (ref 0–40)
Albumin: 4.7 g/dL (ref 3.6–4.8)
Alkaline Phosphatase: 56 IU/L (ref 39–117)
BILIRUBIN TOTAL: 0.8 mg/dL (ref 0.0–1.2)
BUN/Creatinine Ratio: 8 — ABNORMAL LOW (ref 10–22)
BUN: 9 mg/dL (ref 8–27)
CO2: 23 mmol/L (ref 18–29)
Calcium: 10.2 mg/dL (ref 8.6–10.2)
Chloride: 90 mmol/L — ABNORMAL LOW (ref 97–108)
Creatinine, Ser: 1.07 mg/dL (ref 0.76–1.27)
GFR calc Af Amer: 83 mL/min/{1.73_m2} (ref >59)
GFR, EST NON AFRICAN AMERICAN: 72 mL/min/{1.73_m2} (ref >59)
Globulin, Total: 2.7 g/dL (ref 1.5–4.5)
Glucose: 103 mg/dL — ABNORMAL HIGH (ref 65–99)
POTASSIUM: 5.1 mmol/L (ref 3.5–5.2)
Sodium: 130 mmol/L — ABNORMAL LOW (ref 134–144)
Total Protein: 7.4 g/dL (ref 6.0–8.5)

## 2014-07-08 LAB — NMR, LIPOPROFILE
Cholesterol: 156 mg/dL (ref 100–199)
HDL Cholesterol by NMR: 66 mg/dL (ref >39)
HDL Particle Number: 34 umol/L (ref >=30.5)
LDL Particle Number: 762 nmol/L (ref <1000)
LDL SIZE: 21 nm (ref >20.5)
LDLC SERPL CALC-MCNC: 74 mg/dL (ref 0–99)
LP-IR Score: 37 (ref <=45)
SMALL LDL PARTICLE NUMBER: 193 nmol/L (ref <=527)
Triglycerides by NMR: 78 mg/dL (ref 0–149)

## 2014-07-14 ENCOUNTER — Telehealth: Payer: Self-pay | Admitting: Family Medicine

## 2014-07-14 MED ORDER — INDOMETHACIN 50 MG PO CAPS: 50.0000 mg | ORAL_CAPSULE | Freq: Two times a day (BID) | ORAL | Status: DC

## 2014-07-14 NOTE — Telephone Encounter (Signed)
PATIENT AWARE

## 2014-07-14 NOTE — Telephone Encounter (Signed)
Patient is having a flare up of gout and is taking some expired meds that he has for it at home. Wants to know if you will call in meds

## 2014-07-14 NOTE — Telephone Encounter (Signed)
I sent the Rx to the pharmacy.

## 2014-07-19 ENCOUNTER — Telehealth: Payer: Self-pay | Admitting: *Deleted

## 2014-07-19 MED ORDER — DICLOFENAC SODIUM 75 MG PO TBEC: 75.0000 mg | DELAYED_RELEASE_TABLET | Freq: Two times a day (BID) | ORAL | Status: DC

## 2014-07-19 NOTE — Telephone Encounter (Signed)
Mm, ins co denied indomethacin because it is not on the formulary, they said he would have to try everything that is on the formulary before they would cover it,  Formulary drugs are 1-celebrex 2-colcrys or colchicine 3- diclofenac potasseum or diclofenac sodium delayed/extended release 4-diflunisal 5-etodolac extended release 5- flubiprofen 6-ketoprofen 7-oxaproziin 8-piroxicam  9-sulindac OR there are specific reasons why the alternative medications are not appropriate to treat.

## 2014-09-21 ENCOUNTER — Other Ambulatory Visit: Payer: Self-pay | Admitting: Nurse Practitioner

## 2014-09-23 NOTE — Telephone Encounter (Signed)
Please call in xanax with 0 refills 

## 2014-09-23 NOTE — Telephone Encounter (Signed)
Patient last seen in office on 07-07-14. Refilled last on 08-12-14 for #[phone]. Please advise. If approved please route to pool B so nurse can phone in to pharmacy

## 2014-09-24 NOTE — Telephone Encounter (Signed)
Called into pharmacy

## 2014-10-08 ENCOUNTER — Encounter: Payer: Self-pay | Admitting: Nurse Practitioner

## 2014-10-08 ENCOUNTER — Ambulatory Visit (INDEPENDENT_AMBULATORY_CARE_PROVIDER_SITE_OTHER): Payer: Medicare Other | Admitting: Nurse Practitioner

## 2014-10-08 VITALS — BP 168/87 | HR 60 | Temp 98.6°F | Ht 73.0 in | Wt 266.2 lb

## 2014-10-08 DIAGNOSIS — G4700 Insomnia, unspecified: Secondary | ICD-10-CM

## 2014-10-08 DIAGNOSIS — Z125 Encounter for screening for malignant neoplasm of prostate: Secondary | ICD-10-CM

## 2014-10-08 DIAGNOSIS — I10 Essential (primary) hypertension: Secondary | ICD-10-CM

## 2014-10-08 DIAGNOSIS — Z6835 Body mass index (BMI) 35.0-35.9, adult: Secondary | ICD-10-CM

## 2014-10-08 DIAGNOSIS — E785 Hyperlipidemia, unspecified: Secondary | ICD-10-CM

## 2014-10-08 MED ORDER — ALPRAZOLAM 0.25 MG PO TABS: ORAL_TABLET | ORAL | Status: DC

## 2014-10-08 NOTE — Addendum Note (Signed)
Addended by: Chevis Pretty on: 10/08/2014 11:24 AM   Modules accepted: Orders

## 2014-10-08 NOTE — Patient Instructions (Signed)

## 2014-10-08 NOTE — Progress Notes (Signed)
  Subjective:    Patient ID: Daniel Barnett, male    DOB: Apr 28, 1946, 68 y.o.   MRN: [id number]  Patinet here today for follow up of chronic medical problems- no complaints today  Hypertension This is a chronic problem. The current episode started more than 1 year ago. The problem is unchanged. The problem is controlled. Pertinent negatives include no blurred vision, chest pain, headaches, neck pain, orthopnea, palpitations, peripheral edema, shortness of breath or sweats. There are no associated agents to hypertension. Risk factors for coronary artery disease include obesity, male gender and dyslipidemia. Past treatments include ACE inhibitors, diuretics and beta blockers. The current treatment provides significant improvement. Compliance problems include diet and exercise.   Hyperlipidemia This is a chronic problem. The current episode started more than 1 year ago. The problem is uncontrolled. Recent lipid tests were reviewed and are high. There are no known factors aggravating his hyperlipidemia. Pertinent negatives include no chest pain, focal weakness, leg pain, myalgias or shortness of breath. Current antihyperlipidemic treatment includes statins. The current treatment provides moderate improvement of lipids. Compliance problems include adherence to diet and adherence to exercise.  Risk factors for coronary artery disease include hypertension, male sex and obesity.  Insomnia Xanax at night helps him to rest.    Review of Systems  Constitutional: Negative.   Eyes: Negative.  Negative for blurred vision.  Respiratory: Negative for shortness of breath.   Cardiovascular: Negative for chest pain, palpitations and orthopnea.  Genitourinary: Negative.   Musculoskeletal: Negative.  Negative for myalgias and neck pain.  Neurological: Negative for focal weakness and headaches.  Psychiatric/Behavioral: Negative.        Objective:   Physical Exam  Constitutional: He is oriented to person,  place, and time. He appears well-developed and well-nourished.  HENT:  Head: Normocephalic.  Right Ear: External ear normal.  Left Ear: External ear normal.  Nose: Nose normal.  Mouth/Throat: Oropharynx is clear and moist.  Eyes: EOM are normal. Pupils are equal, round, and reactive to light.  Neck: Normal range of motion. Neck supple. No JVD present. Carotid bruit is not present. No thyromegaly present.  Lipoma left proximal clavicle area. No nodules palpable. Nontender   Cardiovascular: Normal rate, regular rhythm, normal heart sounds and intact distal pulses.   No murmur heard. Pulmonary/Chest: Effort normal and breath sounds normal. He has no wheezes. He has no rales.  Abdominal: Soft. Bowel sounds are normal.  Musculoskeletal: Normal range of motion.  Neurological: He is alert and oriented to person, place, and time.  Skin: Skin is warm and dry.  Psychiatric: He has a normal mood and affect. His behavior is normal. Judgment and thought content normal.   BP 168/87  Pulse 60  Temp(Src) 98.6 F (37 C) (Oral)  Ht $R'6\' 1"'mT$  (1.854 m)  Wt 266 lb 3.2 oz (120.748 kg)  BMI 35.13 kg/m2        Assessment & Plan:    1. Insomnia Bedtime rotual  2. Hyperlipidemia with target LDL less than 100 Low fat diet - NMR, lipoprofile  3. Essential hypertension, benign Low NA diet - CMP14+EGFR  4. BMI 35.0-35.9,adult Discussed diet and exercise for person with BMI >25 Will recheck weight in 3-6 months   5. Prostate cancer screening - PSA, total and free   Refuses immunizations Labs pending Health maintenance reviewed Diet and exercise encouraged Continue all meds Follow up  In 3 months   [place], FNP

## 2014-10-09 LAB — NMR, LIPOPROFILE
CHOLESTEROL: 147 mg/dL (ref 100–199)
HDL Cholesterol by NMR: 60 mg/dL (ref >39)
HDL PARTICLE NUMBER: 34.8 umol/L (ref >=30.5)
LDL PARTICLE NUMBER: 865 nmol/L (ref <1000)
LDL Size: 21.1 nm (ref >20.5)
LDLC SERPL CALC-MCNC: 69 mg/dL (ref 0–99)
LP-IR SCORE: 45 (ref <=45)
SMALL LDL PARTICLE NUMBER: 232 nmol/L (ref <=527)
TRIGLYCERIDES BY NMR: 88 mg/dL (ref 0–149)

## 2014-10-09 LAB — CMP14+EGFR
A/G RATIO: 1.7 (ref 1.1–2.5)
ALT: 40 IU/L (ref 0–44)
AST: 33 IU/L (ref 0–40)
Albumin: 4.7 g/dL (ref 3.6–4.8)
Alkaline Phosphatase: 56 IU/L (ref 39–117)
BILIRUBIN TOTAL: 0.7 mg/dL (ref 0.0–1.2)
BUN / CREAT RATIO: 9 — AB (ref 10–22)
BUN: 10 mg/dL (ref 8–27)
CHLORIDE: 90 mmol/L — AB (ref 97–108)
CO2: 24 mmol/L (ref 18–29)
Calcium: 9.9 mg/dL (ref 8.6–10.2)
Creatinine, Ser: 1.14 mg/dL (ref 0.76–1.27)
GFR calc Af Amer: 77 mL/min/{1.73_m2} (ref >59)
GFR, EST NON AFRICAN AMERICAN: 66 mL/min/{1.73_m2} (ref >59)
Globulin, Total: 2.7 g/dL (ref 1.5–4.5)
Glucose: 95 mg/dL (ref 65–99)
Potassium: 5.3 mmol/L — ABNORMAL HIGH (ref 3.5–5.2)
Sodium: 132 mmol/L — ABNORMAL LOW (ref 134–144)
Total Protein: 7.4 g/dL (ref 6.0–8.5)

## 2014-10-09 LAB — PSA, TOTAL AND FREE
PSA, Free Pct: 28.2 %
PSA, Free: 0.31 ng/mL
PSA: 1.1 ng/mL (ref 0.0–4.0)

## 2014-12-13 ENCOUNTER — Telehealth: Payer: Self-pay | Admitting: Nurse Practitioner

## 2014-12-13 NOTE — Telephone Encounter (Signed)
Patient had some relief this AM.  He was advised to take metamucil,  mineral oil,  increase fluids and bulk in his diet.He has an appointment  Dec. 29, 2015 and will discuss with provider if still having issues.

## 2015-01-14 ENCOUNTER — Encounter (INDEPENDENT_AMBULATORY_CARE_PROVIDER_SITE_OTHER): Payer: Self-pay

## 2015-01-14 ENCOUNTER — Ambulatory Visit (INDEPENDENT_AMBULATORY_CARE_PROVIDER_SITE_OTHER): Payer: Medicare Other | Admitting: Nurse Practitioner

## 2015-01-14 ENCOUNTER — Encounter: Payer: Self-pay | Admitting: Nurse Practitioner

## 2015-01-14 VITALS — BP 132/88 | HR 62 | Temp 96.8°F | Ht 73.0 in | Wt 255.0 lb

## 2015-01-14 DIAGNOSIS — G4700 Insomnia, unspecified: Secondary | ICD-10-CM

## 2015-01-14 DIAGNOSIS — E785 Hyperlipidemia, unspecified: Secondary | ICD-10-CM

## 2015-01-14 DIAGNOSIS — I10 Essential (primary) hypertension: Secondary | ICD-10-CM

## 2015-01-14 DIAGNOSIS — K5900 Constipation, unspecified: Secondary | ICD-10-CM

## 2015-01-14 DIAGNOSIS — Z6833 Body mass index (BMI) 33.0-33.9, adult: Secondary | ICD-10-CM

## 2015-01-14 MED ORDER — METOPROLOL SUCCINATE ER 50 MG PO TB24: 50.0000 mg | ORAL_TABLET | Freq: Every day | ORAL | Status: DC

## 2015-01-14 MED ORDER — PRAVASTATIN SODIUM 20 MG PO TABS: 20.0000 mg | ORAL_TABLET | Freq: Every day | ORAL | Status: DC

## 2015-01-14 MED ORDER — ALPRAZOLAM 0.25 MG PO TABS: ORAL_TABLET | ORAL | Status: DC

## 2015-01-14 MED ORDER — LISINOPRIL-HYDROCHLOROTHIAZIDE 20-12.5 MG PO TABS: 1.0000 | ORAL_TABLET | Freq: Every day | ORAL | Status: DC

## 2015-01-14 NOTE — Patient Instructions (Signed)

## 2015-01-14 NOTE — Progress Notes (Signed)
Subjective:    Patient ID: Daniel Barnett, male    DOB: 11/28/1946, 69 y.o.   MRN: [id number]  Patinet here today for follow up of chronic medical problems- Complaint of constipation for the last 2 months- has been using mineraloil and metamucil daily which helps some.  Hypertension This is a chronic problem. The current episode started more than 1 year ago. The problem is unchanged. The problem is controlled. Pertinent negatives include no chest pain, headaches, neck pain, palpitations or shortness of breath. Risk factors for coronary artery disease include dyslipidemia, male gender and sedentary lifestyle. Past treatments include ACE inhibitors, diuretics and beta blockers. The current treatment provides moderate improvement.  Hyperlipidemia This is a chronic problem. The current episode started more than 1 year ago. The problem is uncontrolled. Recent lipid tests were reviewed and are high. He has no history of diabetes, hypothyroidism or obesity. Pertinent negatives include no chest pain, myalgias or shortness of breath. Current antihyperlipidemic treatment includes statins. The current treatment provides moderate improvement of lipids. Compliance problems include adherence to diet and adherence to exercise.  Risk factors for coronary artery disease include dyslipidemia, hypertension and male sex.  Insomnia Xanax at night helps him to rest. Doesn't take every night.     Review of Systems  Constitutional: Negative.   Eyes: Negative.   Respiratory: Negative for shortness of breath.   Cardiovascular: Negative for chest pain and palpitations.  Genitourinary: Negative.   Musculoskeletal: Negative.  Negative for myalgias and neck pain.  Neurological: Negative for headaches.  Psychiatric/Behavioral: Negative.        Objective:   Physical Exam  Constitutional: He is oriented to person, place, and time. He appears well-developed and well-nourished.  HENT:  Head: Normocephalic.  Right  Ear: External ear normal.  Left Ear: External ear normal.  Nose: Nose normal.  Mouth/Throat: Oropharynx is clear and moist.  Eyes: EOM are normal. Pupils are equal, round, and reactive to light.  Neck: Normal range of motion. Neck supple. No JVD present. Carotid bruit is not present. No thyromegaly present.  Lipoma left proximal clavicle area. No nodules palpable. Nontender   Cardiovascular: Normal rate, regular rhythm, normal heart sounds and intact distal pulses.   No murmur heard. Pulmonary/Chest: Effort normal and breath sounds normal. He has no wheezes. He has no rales.  Abdominal: Soft. Bowel sounds are normal.  Musculoskeletal: Normal range of motion.  Neurological: He is alert and oriented to person, place, and time.  Skin: Skin is warm and dry.  Psychiatric: He has a normal mood and affect. His behavior is normal. Judgment and thought content normal.    BP 132/88 mmHg  Pulse 62  Temp(Src) 96.8 F (36 C) (Oral)  Ht 6' 1" (1.854 m)  Wt 255 lb (115.667 kg)  BMI 33.65 kg/m2        Assessment & Plan:   1. Essential hypertension, benign Do not add salt to diet - lisinopril-hydrochlorothiazide (PRINZIDE,ZESTORETIC) 20-12.5 MG per tablet; Take 1 tablet by mouth daily.  Dispense: 90 tablet; Refill: 1 - metoprolol succinate (TOPROL-XL) 50 MG 24 hr tablet; Take 1 tablet (50 mg total) by mouth daily. Take with or immediately following a meal.  Dispense: 90 tablet; Refill: 1 - CMP14+EGFR  2. Insomnia Bedtime ritual - ALPRAZolam (XANAX) 0.25 MG tablet; TAKE 1 TABLET AT BEDTIME AS NEEDED FOR ANXIETY  Dispense: 30 tablet; Refill: 2  3. Hyperlipidemia with target LDL less than 100 Low fat - pravastatin (PRAVACHOL) 20 MG tablet; Take 1 tablet (  20 mg total) by mouth daily.  Dispense: 90 tablet; Refill: 1 - NMR, lipoprofile  4. BMI 33.0-33.9,adult Discussed diet and exercise for person with BMI >25 Will recheck weight in 3-6 months   5. Constipation, unspecified constipation  type miralax daily OTC Increase fiber in diet     Labs pending Health maintenance reviewed- refuses all health maintenance Diet and exercise encouraged Continue all meds Follow up  In 3 month   [place], FNP

## 2015-01-15 LAB — CMP14+EGFR
ALT: 45 IU/L — ABNORMAL HIGH (ref 0–44)
AST: 30 IU/L (ref 0–40)
Albumin/Globulin Ratio: 1.8 (ref 1.1–2.5)
Albumin: 4.5 g/dL (ref 3.6–4.8)
Alkaline Phosphatase: 59 IU/L (ref 39–117)
BUN / CREAT RATIO: 8 — AB (ref 10–22)
BUN: 9 mg/dL (ref 8–27)
CHLORIDE: 90 mmol/L — AB (ref 97–108)
CO2: 24 mmol/L (ref 18–29)
Calcium: 10 mg/dL (ref 8.6–10.2)
Creatinine, Ser: 1.06 mg/dL (ref 0.76–1.27)
GFR calc Af Amer: 84 mL/min/{1.73_m2} (ref >59)
GFR calc non Af Amer: 72 mL/min/{1.73_m2} (ref >59)
Globulin, Total: 2.5 g/dL (ref 1.5–4.5)
Glucose: 98 mg/dL (ref 65–99)
POTASSIUM: 5.2 mmol/L (ref 3.5–5.2)
SODIUM: 131 mmol/L — AB (ref 134–144)
Total Bilirubin: 0.9 mg/dL (ref 0.0–1.2)
Total Protein: 7 g/dL (ref 6.0–8.5)

## 2015-01-15 LAB — NMR, LIPOPROFILE
CHOLESTEROL: 141 mg/dL (ref 100–199)
HDL Cholesterol by NMR: 64 mg/dL (ref >39)
HDL PARTICLE NUMBER: 32 umol/L (ref >=30.5)
LDL Particle Number: 586 nmol/L (ref <1000)
LDL Size: 20.5 nm (ref >20.5)
LDL-C: 63 mg/dL (ref 0–99)
LP-IR SCORE: 39 (ref <=45)
Small LDL Particle Number: 289 nmol/L (ref <=527)
Triglycerides by NMR: 68 mg/dL (ref 0–149)

## 2015-04-01 ENCOUNTER — Other Ambulatory Visit: Payer: Self-pay | Admitting: Nurse Practitioner

## 2015-04-01 NOTE — Telephone Encounter (Signed)
Last filled 03/03/15, last seen 01/14/15. [facility] into [facility]

## 2015-04-01 NOTE — Telephone Encounter (Signed)
Please call in xanax with 1 refills 

## 2015-04-04 NOTE — Telephone Encounter (Signed)
rx called into pharmacy

## 2015-04-18 ENCOUNTER — Ambulatory Visit (INDEPENDENT_AMBULATORY_CARE_PROVIDER_SITE_OTHER): Payer: Commercial Managed Care - HMO | Admitting: Nurse Practitioner

## 2015-04-18 ENCOUNTER — Encounter: Payer: Self-pay | Admitting: Nurse Practitioner

## 2015-04-18 VITALS — BP 140/86 | HR 58 | Temp 96.6°F | Ht 73.0 in | Wt 255.0 lb

## 2015-04-18 DIAGNOSIS — I10 Essential (primary) hypertension: Secondary | ICD-10-CM

## 2015-04-18 DIAGNOSIS — K5900 Constipation, unspecified: Secondary | ICD-10-CM | POA: Insufficient documentation

## 2015-04-18 DIAGNOSIS — G4700 Insomnia, unspecified: Secondary | ICD-10-CM

## 2015-04-18 DIAGNOSIS — Z23 Encounter for immunization: Secondary | ICD-10-CM

## 2015-04-18 DIAGNOSIS — Z6833 Body mass index (BMI) 33.0-33.9, adult: Secondary | ICD-10-CM

## 2015-04-18 DIAGNOSIS — E785 Hyperlipidemia, unspecified: Secondary | ICD-10-CM | POA: Diagnosis not present

## 2015-04-18 NOTE — Addendum Note (Signed)
Addended by: Rolena Infante on: 04/18/2015 01:55 PM   Modules accepted: Orders

## 2015-04-18 NOTE — Progress Notes (Signed)
Subjective:    Patient ID: Daniel Barnett, male    DOB: 10-Aug-1946, 69 y.o.   MRN: [id number]  Patinet here today for follow up of chronic medical problems-  * Patient has an old rib fracture on right side and was leaning over tub to fix faucet and felt a poop in same area. Hurt to take deep breath and cough but that has gotten better.  Hypertension This is a chronic problem. The current episode started more than 1 year ago. The problem is unchanged. The problem is controlled. Pertinent negatives include no chest pain, headaches, neck pain, palpitations or shortness of breath. Risk factors for coronary artery disease include dyslipidemia, male gender and sedentary lifestyle. Past treatments include ACE inhibitors, diuretics and beta blockers. The current treatment provides moderate improvement.  Hyperlipidemia This is a chronic problem. The current episode started more than 1 year ago. The problem is uncontrolled. Recent lipid tests were reviewed and are high. He has no history of diabetes, hypothyroidism or obesity. Pertinent negatives include no chest pain, myalgias or shortness of breath. Current antihyperlipidemic treatment includes statins. The current treatment provides moderate improvement of lipids. Compliance problems include adherence to diet and adherence to exercise.  Risk factors for coronary artery disease include dyslipidemia, hypertension and male sex.  Insomnia Xanax at night helps him to rest. Doesn't take every night. constipation Takes miralax daily but that affects him going out in public.   Review of Systems  Constitutional: Negative.   Eyes: Negative.   Respiratory: Negative for shortness of breath.   Cardiovascular: Negative for chest pain and palpitations.  Genitourinary: Negative.   Musculoskeletal: Negative.  Negative for myalgias and neck pain.  Neurological: Negative for headaches.  Psychiatric/Behavioral: Negative.        Objective:   Physical Exam   Constitutional: He is oriented to person, place, and time. He appears well-developed and well-nourished.  HENT:  Head: Normocephalic.  Right Ear: External ear normal.  Left Ear: External ear normal.  Nose: Nose normal.  Mouth/Throat: Oropharynx is clear and moist.  Eyes: EOM are normal. Pupils are equal, round, and reactive to light.  Neck: Normal range of motion. Neck supple. No JVD present. Carotid bruit is not present. No thyromegaly present.  Lipoma left proximal clavicle area. No nodules palpable. Nontender   Cardiovascular: Normal rate, regular rhythm, normal heart sounds and intact distal pulses.   No murmur heard. Pulmonary/Chest: Effort normal and breath sounds normal. He has no wheezes. He has no rales.  Abdominal: Soft. Bowel sounds are normal.  Musculoskeletal: Normal range of motion.  Neurological: He is alert and oriented to person, place, and time.  Skin: Skin is warm and dry.  Psychiatric: He has a normal mood and affect. His behavior is normal. Judgment and thought content normal.    BP 140/86 mmHg  Pulse 58  Temp(Src) 96.6 F (35.9 C) (Oral)  Ht $R'6\' 1"'Gm$  (1.854 m)  Wt 255 lb (115.667 kg)  BMI 33.65 kg/m2          Assessment & Plan:   1. Essential hypertension, benign Do not add salt to diet - CMP14+EGFR  2. Insomnia Bedtime ritual  3. Hyperlipidemia with target LDL less than 100 Low fat diet - NMR, lipoprofile  4. BMI 33.0-33.9,adult Discussed diet and exercise for person with BMI >25 Will recheck weight in 3-6 months   5. Constipation, unspecified constipation type Continue miralax    Labs pending Health maintenance reviewed Diet and exercise encouraged Continue all meds Follow  up  In 3 months   [place], FNP

## 2015-04-18 NOTE — Patient Instructions (Signed)
Fat and Cholesterol Control Diet Fat and cholesterol levels in your blood and organs are influenced by your diet. High levels of fat and cholesterol may lead to diseases of the heart, small and large blood vessels, gallbladder, liver, and pancreas. CONTROLLING FAT AND CHOLESTEROL WITH DIET Although exercise and lifestyle factors are important, your diet is key. That is because certain foods are known to raise cholesterol and others to lower it. The goal is to balance foods for their effect on cholesterol and more importantly, to replace saturated and trans fat with other types of fat, such as monounsaturated fat, polyunsaturated fat, and omega-3 fatty acids. On average, a person should consume no more than 15 to 17 g of saturated fat daily. Saturated and trans fats are considered "bad" fats, and they will raise LDL cholesterol. Saturated fats are primarily found in animal products such as meats, butter, and cream. However, that does not mean you need to give up all your favorite foods. Today, there are good tasting, low-fat, low-cholesterol substitutes for most of the things you like to eat. Choose low-fat or nonfat alternatives. Choose round or loin cuts of red meat. These types of cuts are lowest in fat and cholesterol. Chicken (without the skin), fish, veal, and ground turkey breast are great choices. Eliminate fatty meats, such as hot dogs and salami. Even shellfish have little or no saturated fat. Have a 3 oz (85 g) portion when you eat lean meat, poultry, or fish. Trans fats are also called "partially hydrogenated oils." They are oils that have been scientifically manipulated so that they are solid at room temperature resulting in a longer shelf life and improved taste and texture of foods in which they are added. Trans fats are found in stick margarine, some tub margarines, cookies, crackers, and baked goods.  When baking and cooking, oils are a great substitute for butter. The monounsaturated oils are  especially beneficial since it is believed they lower LDL and raise HDL. The oils you should avoid entirely are saturated tropical oils, such as coconut and palm.  Remember to eat a lot from food groups that are naturally free of saturated and trans fat, including fish, fruit, vegetables, beans, grains (barley, rice, couscous, bulgur wheat), and pasta (without cream sauces).  IDENTIFYING FOODS THAT LOWER FAT AND CHOLESTEROL  Soluble fiber may lower your cholesterol. This type of fiber is found in fruits such as apples, vegetables such as broccoli, potatoes, and carrots, legumes such as beans, peas, and lentils, and grains such as barley. Foods fortified with plant sterols (phytosterol) may also lower cholesterol. You should eat at least 2 g per day of these foods for a cholesterol lowering effect.  Read package labels to identify low-saturated fats, trans fat free, and low-fat foods at the supermarket. Select cheeses that have only 2 to 3 g saturated fat per ounce. Use a heart-healthy tub margarine that is free of trans fats or partially hydrogenated oil. When buying baked goods (cookies, crackers), avoid partially hydrogenated oils. Breads and muffins should be made from whole grains (whole-wheat or whole oat flour, instead of "flour" or "enriched flour"). Buy non-creamy canned soups with reduced salt and no added fats.  FOOD PREPARATION TECHNIQUES  Never deep-fry. If you must fry, either stir-fry, which uses very little fat, or use non-stick cooking sprays. When possible, broil, bake, or roast meats, and steam vegetables. Instead of putting butter or margarine on vegetables, use lemon and herbs, applesauce, and cinnamon (for squash and sweet potatoes). Use nonfat   yogurt, salsa, and low-fat dressings for salads.  LOW-SATURATED FAT / LOW-FAT FOOD SUBSTITUTES Meats / Saturated Fat (g)  Avoid: Steak, marbled (3 oz/85 g) / 11 g  Choose: Steak, lean (3 oz/85 g) / 4 g  Avoid: Hamburger (3 oz/85 g) / 7  g  Choose: Hamburger, lean (3 oz/85 g) / 5 g  Avoid: Ham (3 oz/85 g) / 6 g  Choose: Ham, lean cut (3 oz/85 g) / 2.4 g  Avoid: Chicken, with skin, dark meat (3 oz/85 g) / 4 g  Choose: Chicken, skin removed, dark meat (3 oz/85 g) / 2 g  Avoid: Chicken, with skin, light meat (3 oz/85 g) / 2.5 g  Choose: Chicken, skin removed, light meat (3 oz/85 g) / 1 g Dairy / Saturated Fat (g)  Avoid: Whole milk (1 cup) / 5 g  Choose: Low-fat milk, 2% (1 cup) / 3 g  Choose: Low-fat milk, 1% (1 cup) / 1.5 g  Choose: Skim milk (1 cup) / 0.3 g  Avoid: Hard cheese (1 oz/28 g) / 6 g  Choose: Skim milk cheese (1 oz/28 g) / 2 to 3 g  Avoid: Cottage cheese, 4% fat (1 cup) / 6.5 g  Choose: Low-fat cottage cheese, 1% fat (1 cup) / 1.5 g  Avoid: Ice cream (1 cup) / 9 g  Choose: Sherbet (1 cup) / 2.5 g  Choose: Nonfat frozen yogurt (1 cup) / 0.3 g  Choose: Frozen fruit bar / trace  Avoid: Whipped cream (1 tbs) / 3.5 g  Choose: Nondairy whipped topping (1 tbs) / 1 g Condiments / Saturated Fat (g)  Avoid: Mayonnaise (1 tbs) / 2 g  Choose: Low-fat mayonnaise (1 tbs) / 1 g  Avoid: Butter (1 tbs) / 7 g  Choose: Extra light margarine (1 tbs) / 1 g  Avoid: Coconut oil (1 tbs) / 11.8 g  Choose: Olive oil (1 tbs) / 1.8 g  Choose: Corn oil (1 tbs) / 1.7 g  Choose: Safflower oil (1 tbs) / 1.2 g  Choose: Sunflower oil (1 tbs) / 1.4 g  Choose: Soybean oil (1 tbs) / 2.4 g  Choose: Canola oil (1 tbs) / 1 g Document Released: 12/03/2005 Document Revised: 03/30/2013 Document Reviewed: 03/03/2014 [employer] Patient Information 2015 [employer]. This information is not intended to replace advice given to you by your health care provider. Make sure you discuss any questions you have with your health care provider.  

## 2015-04-19 LAB — CMP14+EGFR
ALBUMIN: 4.5 g/dL (ref 3.6–4.8)
ALK PHOS: 65 IU/L (ref 39–117)
ALT: 33 IU/L (ref 0–44)
AST: 27 IU/L (ref 0–40)
Albumin/Globulin Ratio: 1.7 (ref 1.1–2.5)
BUN / CREAT RATIO: 10 (ref 10–22)
BUN: 12 mg/dL (ref 8–27)
Bilirubin Total: 0.7 mg/dL (ref 0.0–1.2)
CALCIUM: 10.1 mg/dL (ref 8.6–10.2)
CHLORIDE: 89 mmol/L — AB (ref 97–108)
CO2: 25 mmol/L (ref 18–29)
Creatinine, Ser: 1.17 mg/dL (ref 0.76–1.27)
GFR calc Af Amer: 74 mL/min/{1.73_m2} (ref >59)
GFR calc non Af Amer: 64 mL/min/{1.73_m2} (ref >59)
GLUCOSE: 84 mg/dL (ref 65–99)
Globulin, Total: 2.6 g/dL (ref 1.5–4.5)
Potassium: 5.1 mmol/L (ref 3.5–5.2)
Sodium: 132 mmol/L — ABNORMAL LOW (ref 134–144)
Total Protein: 7.1 g/dL (ref 6.0–8.5)

## 2015-04-19 LAB — NMR, LIPOPROFILE
CHOLESTEROL: 136 mg/dL (ref 100–199)
HDL Cholesterol by NMR: 73 mg/dL (ref >39)
HDL Particle Number: 31.8 umol/L (ref >=30.5)
LDL PARTICLE NUMBER: 442 nmol/L (ref <1000)
LDL SIZE: 20.9 nm (ref >20.5)
LDL-C: 49 mg/dL (ref 0–99)
LP-IR SCORE: 26 (ref <=45)
Small LDL Particle Number: 90 nmol/L (ref <=527)
Triglycerides by NMR: 70 mg/dL (ref 0–149)

## 2015-04-20 ENCOUNTER — Telehealth: Payer: Self-pay | Admitting: *Deleted

## 2015-04-20 NOTE — Telephone Encounter (Signed)
-----   Message from [facility], [place] sent at 04/19/2015  1:34 PM EDT ----- Kidney and liver function stable NA+ slightly low add a little salt to one meal a day Cholesterol looks great Continue current meds- low fat diet and exercise and recheck in 3 months

## 2015-04-21 ENCOUNTER — Telehealth: Payer: Self-pay

## 2015-04-21 NOTE — Telephone Encounter (Signed)
Xanax was order on 04/01/15 to early to get filed now

## 2015-04-21 NOTE — Telephone Encounter (Signed)
Pt aware of when med can be refilled, just mentioned this while getting lab results

## 2015-05-30 ENCOUNTER — Other Ambulatory Visit: Payer: Self-pay | Admitting: Nurse Practitioner

## 2015-05-31 NOTE — Telephone Encounter (Signed)
Called into pharmacy

## 2015-05-31 NOTE — Telephone Encounter (Signed)
Last seen 04/18/15 MMM If approved route to nurse to call into [facility]

## 2015-06-24 ENCOUNTER — Encounter: Payer: Self-pay | Admitting: *Deleted

## 2015-07-26 ENCOUNTER — Ambulatory Visit: Payer: Commercial Managed Care - HMO | Admitting: Nurse Practitioner

## 2015-08-11 ENCOUNTER — Telehealth: Payer: Self-pay | Admitting: Nurse Practitioner

## 2015-08-11 MED ORDER — AMOXICILLIN 875 MG PO TABS: ORAL_TABLET | ORAL | Status: DC

## 2015-08-11 NOTE — Telephone Encounter (Signed)
Amoxicillin I sent the Rx to the pharmacy.

## 2015-08-16 ENCOUNTER — Encounter: Payer: Self-pay | Admitting: Nurse Practitioner

## 2015-08-16 ENCOUNTER — Ambulatory Visit (INDEPENDENT_AMBULATORY_CARE_PROVIDER_SITE_OTHER): Payer: Commercial Managed Care - HMO | Admitting: Nurse Practitioner

## 2015-08-16 VITALS — BP 160/80 | HR 57 | Temp 97.5°F | Ht 73.0 in | Wt 253.0 lb

## 2015-08-16 DIAGNOSIS — G4700 Insomnia, unspecified: Secondary | ICD-10-CM

## 2015-08-16 DIAGNOSIS — I10 Essential (primary) hypertension: Secondary | ICD-10-CM | POA: Diagnosis not present

## 2015-08-16 DIAGNOSIS — Z6833 Body mass index (BMI) 33.0-33.9, adult: Secondary | ICD-10-CM | POA: Diagnosis not present

## 2015-08-16 DIAGNOSIS — E785 Hyperlipidemia, unspecified: Secondary | ICD-10-CM

## 2015-08-16 MED ORDER — ALPRAZOLAM 0.25 MG PO TABS: ORAL_TABLET | ORAL | Status: DC

## 2015-08-16 MED ORDER — LISINOPRIL-HYDROCHLOROTHIAZIDE 20-12.5 MG PO TABS: 2.0000 | ORAL_TABLET | Freq: Every day | ORAL | Status: DC

## 2015-08-16 MED ORDER — METOPROLOL SUCCINATE ER 50 MG PO TB24: 50.0000 mg | ORAL_TABLET | Freq: Every day | ORAL | Status: DC

## 2015-08-16 MED ORDER — PRAVASTATIN SODIUM 20 MG PO TABS: 20.0000 mg | ORAL_TABLET | Freq: Every day | ORAL | Status: DC

## 2015-08-16 NOTE — Progress Notes (Addendum)
Subjective:    Patient ID: Daniel Barnett, male    DOB: [birth year], 69 y.o.   MRN: [id number]  HPI:  Hypertension This is a chronic problem. The current episode started more than 1 year ago. The problem is unchanged. The problem is controlled. Pertinent negatives include no blurred vision, chest pain, headaches, neck pain, palpitations or shortness of breath. There are no associated agents to hypertension. Risk factors for coronary artery disease include dyslipidemia, male gender, sedentary lifestyle and obesity. Past treatments include ACE inhibitors, diuretics and beta blockers. The current treatment provides moderate improvement. Compliance problems include diet.  There is no history of CVA or heart failure.  Hyperlipidemia This is a chronic problem. The current episode started more than 1 year ago. The problem is uncontrolled. Recent lipid tests were reviewed and are high. He has no history of diabetes, hypothyroidism or obesity. Pertinent negatives include no chest pain, myalgias or shortness of breath. Current antihyperlipidemic treatment includes statins. The current treatment provides moderate improvement of lipids. Compliance problems include adherence to diet and adherence to exercise.  Risk factors for coronary artery disease include dyslipidemia, hypertension and male sex.  Insomnia Xanax at night helps him to rest. Reports he takes one every night before bedtime to help with sleep. This is helping him to sleep all night. Constipation Takes miralax daily and this is helping with the constipation. No changes in bowl routine.    Review of Systems  Constitutional: Negative.   Eyes: Negative.  Negative for blurred vision.  Respiratory: Negative for shortness of breath.   Cardiovascular: Negative for chest pain and palpitations.  Genitourinary: Negative.   Musculoskeletal: Negative.  Negative for myalgias and neck pain.  Skin: Negative.   Neurological: Negative for headaches.   Psychiatric/Behavioral: Negative.        Objective:   Physical Exam  Constitutional: He is oriented to person, place, and time. He appears well-developed and well-nourished.  HENT:  Head: Normocephalic.  Right Ear: External ear normal.  Left Ear: External ear normal.  Nose: Nose normal.  Mouth/Throat: Oropharynx is clear and moist.  Eyes: EOM are normal. Pupils are equal, round, and reactive to light.  Neck: Normal range of motion. Neck supple. No JVD present. Carotid bruit is not present. No thyromegaly present.  Lipoma left proximal clavicle area. No nodules palpable. Nontender   Cardiovascular: Normal rate, regular rhythm, normal heart sounds and intact distal pulses.   No murmur heard. Pulmonary/Chest: Effort normal and breath sounds normal. He has no wheezes. He has no rales.  Abdominal: Soft. Bowel sounds are normal.  Musculoskeletal: Normal range of motion.  Neurological: He is alert and oriented to person, place, and time.  Skin: Skin is warm and dry.  Psychiatric: He has a normal mood and affect. His behavior is normal. Judgment and thought content normal.    BP 178/91 mmHg  Pulse 57  Temp(Src) 97.5 F (36.4 C) (Oral)  Ht _0  (1.854 m)  Wt 253 lb (114.76 kg)  BMI 33.39 kg/m2      Assessment & Plan:   1. Essential hypertension, benign Continue with diet and exercise. No added salt to diet Increased lisinopril-hctz to 2 tablets once a day for better BP control - metoprolol succinate (TOPROL-XL) 50 MG 24 hr tablet; Take 2 tablet (50 mg total) by mouth daily. Take with or immediately following a meal.  Dispense: 90 tablet; Refill: 1 - lisinopril-hydrochlorothiazide (PRINZIDE,ZESTORETIC) 20-12.5 MG per tablet; Take 2 tablets by mouth daily.  Dispense: 180 tablet;  Refill: 1 - CMP14+EGFR  2. Hyperlipidemia with target LDL less than 100 Avoid fatty foods - pravastatin (PRAVACHOL) 20 MG tablet; Take 1 tablet (20 mg total) by mouth daily.  Dispense: 90 tablet;  Refill: 1 - Lipid panel  3. BMI 33.0-33.9,adult Discussed diet and exercise for person with BMI >25 Will recheck weight in 3-6 months   4. Insomnia Bedtime ritual - ALPRAZolam (XANAX) 0.25 MG tablet; TAKE 1 TABLET AT BEDTIME AS NEEDED FOR ANXIETY  Dispense: 30 tablet; Refill: 2    Labs pending Health maintenance reviewed Diet and exercise encouraged Continue all meds Follow up  In 3 months   [place], FNP

## 2015-08-16 NOTE — Patient Instructions (Signed)
Insomnia Insomnia is frequent trouble falling and/or staying asleep. Insomnia can be a long term problem or a short term problem. Both are common. Insomnia can be a short term problem when the wakefulness is related to a certain stress or worry. Long term insomnia is often related to ongoing stress during waking hours and/or poor sleeping habits. Overtime, sleep deprivation itself can make the problem worse. Every little thing feels more severe because you are overtired and your ability to cope is decreased. CAUSES   Stress, anxiety, and depression.  Poor sleeping habits.  Distractions such as TV in the bedroom.  Naps close to bedtime.  Engaging in emotionally charged conversations before bed.  Technical reading before sleep.  Alcohol and other sedatives. They may make the problem worse. They can hurt normal sleep patterns and normal dream activity.  Stimulants such as caffeine for several hours prior to bedtime.  Pain syndromes and shortness of breath can cause insomnia.  Exercise late at night.  Changing time zones may cause sleeping problems (jet lag). It is sometimes helpful to have someone observe your sleeping patterns. They should look for periods of not breathing during the night (sleep apnea). They should also look to see how long those periods last. If you live alone or observers are uncertain, you can also be observed at a sleep clinic where your sleep patterns will be professionally monitored. Sleep apnea requires a checkup and treatment. Give your caregivers your medical history. Give your caregivers observations your family has made about your sleep.  SYMPTOMS   Not feeling rested in the morning.  Anxiety and restlessness at bedtime.  Difficulty falling and staying asleep. TREATMENT   Your caregiver may prescribe treatment for an underlying medical disorders. Your caregiver can give advice or help if you are using alcohol or other drugs for self-medication. Treatment  of underlying problems will usually eliminate insomnia problems.  Medications can be prescribed for short time use. They are generally not recommended for lengthy use.  Over-the-counter sleep medicines are not recommended for lengthy use. They can be habit forming.  You can promote easier sleeping by making lifestyle changes such as:  Using relaxation techniques that help with breathing and reduce muscle tension.  Exercising earlier in the day.  Changing your diet and the time of your last meal. No night time snacks.  Establish a regular time to go to bed.  Counseling can help with stressful problems and worry.  Soothing music and white noise may be helpful if there are background noises you cannot remove.  Stop tedious detailed work at least one hour before bedtime. HOME CARE INSTRUCTIONS   Keep a diary. Inform your caregiver about your progress. This includes any medication side effects. See your caregiver regularly. Take note of:  Times when you are asleep.  Times when you are awake during the night.  The quality of your sleep.  How you feel the next day. This information will help your caregiver care for you.  Get out of bed if you are still awake after 15 minutes. Read or do some quiet activity. Keep the lights down. Wait until you feel sleepy and go back to bed.  Keep regular sleeping and waking hours. Avoid naps.  Exercise regularly.  Avoid distractions at bedtime. Distractions include watching television or engaging in any intense or detailed activity like attempting to balance the household checkbook.  Develop a bedtime ritual. Keep a familiar routine of bathing, brushing your teeth, climbing into bed at the same   time each night, listening to soothing music. Routines increase the success of falling to sleep faster.  Use relaxation techniques. This can be using breathing and muscle tension release routines. It can also include visualizing peaceful scenes. You can  also help control troubling or intruding thoughts by keeping your mind occupied with boring or repetitive thoughts like the old concept of counting sheep. You can make it more creative like imagining planting one beautiful flower after another in your backyard garden.  During your day, work to eliminate stress. When this is not possible use some of the previous suggestions to help reduce the anxiety that accompanies stressful situations. MAKE SURE YOU:   Understand these instructions.  Will watch your condition.  Will get help right away if you are not doing well or get worse. Document Released: 11/30/2000 Document Revised: 02/25/2012 Document Reviewed: 12/31/2007 ExitCare Patient Information 2015 [employer]. This information is not intended to replace advice given to you by your health care provider. Make sure you discuss any questions you have with your health care provider.  

## 2015-08-17 LAB — CMP14+EGFR
ALBUMIN: 4.8 g/dL (ref 3.6–4.8)
ALT: 42 IU/L (ref 0–44)
AST: 36 IU/L (ref 0–40)
Albumin/Globulin Ratio: 1.8 (ref 1.1–2.5)
Alkaline Phosphatase: 59 IU/L (ref 39–117)
BUN / CREAT RATIO: 10 (ref 10–22)
BUN: 11 mg/dL (ref 8–27)
Bilirubin Total: 1 mg/dL (ref 0.0–1.2)
CO2: 24 mmol/L (ref 18–29)
CREATININE: 1.06 mg/dL (ref 0.76–1.27)
Calcium: 10.3 mg/dL — ABNORMAL HIGH (ref 8.6–10.2)
Chloride: 86 mmol/L — ABNORMAL LOW (ref 97–108)
GFR calc non Af Amer: 72 mL/min/{1.73_m2} (ref >59)
GFR, EST AFRICAN AMERICAN: 83 mL/min/{1.73_m2} (ref >59)
GLUCOSE: 97 mg/dL (ref 65–99)
Globulin, Total: 2.7 g/dL (ref 1.5–4.5)
Potassium: 5.2 mmol/L (ref 3.5–5.2)
Sodium: 128 mmol/L — ABNORMAL LOW (ref 134–144)
TOTAL PROTEIN: 7.5 g/dL (ref 6.0–8.5)

## 2015-08-17 LAB — LIPID PANEL
CHOLESTEROL TOTAL: 146 mg/dL (ref 100–199)
Chol/HDL Ratio: 2 ratio units (ref 0.0–5.0)
HDL: 72 mg/dL (ref >39)
LDL CALC: 63 mg/dL (ref 0–99)
Triglycerides: 56 mg/dL (ref 0–149)
VLDL CHOLESTEROL CAL: 11 mg/dL (ref 5–40)

## 2015-08-25 ENCOUNTER — Other Ambulatory Visit: Payer: Self-pay | Admitting: Family

## 2015-08-25 NOTE — Telephone Encounter (Signed)
Patient was seen 08/16/15 and it says that Xanax was ordered that encounter but I don't know if it was called it or printed or not at all MMM

## 2015-08-25 NOTE — Telephone Encounter (Signed)
rx called into pharmacy

## 2015-08-25 NOTE — Telephone Encounter (Signed)
Please call in xanax with 1 refills 

## 2015-09-29 ENCOUNTER — Other Ambulatory Visit: Payer: Self-pay | Admitting: Family

## 2015-09-29 NOTE — Telephone Encounter (Signed)
Please call in xanax with 1 refills 

## 2015-09-29 NOTE — Telephone Encounter (Signed)
Last filled 08/25/15, last seej 08/16/15. [facility]

## 2015-09-29 NOTE — Telephone Encounter (Signed)
[facility] 

## 2015-11-23 ENCOUNTER — Encounter: Payer: Self-pay | Admitting: Nurse Practitioner

## 2015-11-23 ENCOUNTER — Ambulatory Visit (INDEPENDENT_AMBULATORY_CARE_PROVIDER_SITE_OTHER): Payer: Commercial Managed Care - HMO | Admitting: Nurse Practitioner

## 2015-11-23 VITALS — BP 144/85 | HR 57 | Temp 96.7°F | Ht 73.0 in | Wt 249.0 lb

## 2015-11-23 DIAGNOSIS — Z125 Encounter for screening for malignant neoplasm of prostate: Secondary | ICD-10-CM

## 2015-11-23 DIAGNOSIS — Z1212 Encounter for screening for malignant neoplasm of rectum: Secondary | ICD-10-CM | POA: Diagnosis not present

## 2015-11-23 DIAGNOSIS — Z6832 Body mass index (BMI) 32.0-32.9, adult: Secondary | ICD-10-CM | POA: Diagnosis not present

## 2015-11-23 DIAGNOSIS — I10 Essential (primary) hypertension: Secondary | ICD-10-CM | POA: Diagnosis not present

## 2015-11-23 DIAGNOSIS — Z1159 Encounter for screening for other viral diseases: Secondary | ICD-10-CM | POA: Diagnosis not present

## 2015-11-23 DIAGNOSIS — Z23 Encounter for immunization: Secondary | ICD-10-CM

## 2015-11-23 DIAGNOSIS — G4700 Insomnia, unspecified: Secondary | ICD-10-CM | POA: Diagnosis not present

## 2015-11-23 DIAGNOSIS — E785 Hyperlipidemia, unspecified: Secondary | ICD-10-CM | POA: Diagnosis not present

## 2015-11-23 MED ORDER — METOPROLOL SUCCINATE ER 50 MG PO TB24: 50.0000 mg | ORAL_TABLET | Freq: Every day | ORAL | Status: DC

## 2015-11-23 MED ORDER — LISINOPRIL-HYDROCHLOROTHIAZIDE 20-12.5 MG PO TABS: 2.0000 | ORAL_TABLET | Freq: Every day | ORAL | Status: DC

## 2015-11-23 MED ORDER — PRAVASTATIN SODIUM 20 MG PO TABS: 20.0000 mg | ORAL_TABLET | Freq: Every day | ORAL | Status: DC

## 2015-11-23 MED ORDER — ALPRAZOLAM 0.25 MG PO TABS: ORAL_TABLET | ORAL | Status: DC

## 2015-11-23 NOTE — Patient Instructions (Signed)
Eating Healthy on a Budget There are many ways to save money at the grocery store and continue to eat healthy. You can be successful if you plan your meals according to your budget, purchase according to your budget and grocery list, and prepare food yourself.  How can I buy more food on a limited budget? Plan  Plan meals and snacks according to a grocery list and budget you create.  Look for recipes where you can cook once and make enough food for two meals.  Include meals that will "stretch" more expensive foods such as stews, casseroles, and stir-fry dishes.  Make a grocery list and make sure to bring it with you to the store. If you have a smart phone, you could use your phone to create your shopping list. Purchase  When grocery shopping, buy only the items on your grocery list and go only to the areas of the store that have the items on your list. Prepare  Some meal items can be prepared in advance. Pre-cook on days when you have extra time.  Make extra food (such as by doubling recipes) and freeze the extras in meal-sized containers or in individual portions for fast meals and snacks.  Use leftovers in your meal plan for the week.  Try some meatless meals or try "no cook" meals like salads.  When you come home from the grocery store, wash and prepare your fruits and vegetables so they are ready to use and eat. This will help reduce food waste. How can I buy more food on a limited budget?  Try these tips the next time you go shopping:   Buy store brands or generic brands.  Use coupons only for foods and brands you normally buy. Avoid buying items you wouldn't normally buy simply because they are on sale.  Check online and in newspapers for weekly deals.  Buy healthy items from the bulk bins when available, such as herbs, spices, flours, pastas, nuts, and dried fruit.  Buy fruits and vegetables that are in season. Prices are usually lower on in-season produce.  Compare and  contrast different items. You can do this by looking at the unit price on the price tag. Use it to compare different brands and sizes to find out which item is the best deal.  Choose naturally low-cost healthy items, such as carrots, potatoes, apples, bananas, and oranges. Dried or canned beans are a low-cost protein source.  Buy in bulk and freeze extra food. Items you can buy in bulk include meats, fish, poultry, frozen fruits, and frozen vegetables.  Limit the purchase of prepared or "ready-to-eat" foods, such as pre-cut fruits and vegetables and pre-made salads.  If possible, shop around to discover which grocery store offers the best prices. Some stores charge much more than other stores for the same items.  Do not shop when you are hungry. If you shop while hungry, It may be hard to stick to your list and budget.  Stick to your list and resist impulse buys. Treat your list as your official plan for the week.  Buy a variety of vegetables and fruit by purchasing fresh, frozen, and canned items.  Look beyond eye level. Foods at eye level (adult or child eye level) are more expensive. Look at the top and bottom shelves for deals.  Be efficient with your time when shopping. The more time you spend at the store, the more money you are likely to spend.  Consider other retailers such as dollar   stores, larger wholesale stores, local fruit and vegetable stands, and farmers markets. What are some tips for less expensive food substitutions? When choosing more expensive foods like meats and dairy, try these tips to save money:  Choose cheaper cuts of meat, such as bone-in chicken thighs and drumsticks instead skinless and boneless chicken. When you are ready to prepare the chicken, you can remove the skin yourself to make it healthier.  Choose lean meats like chicken or turkey. When choosing ground beef, make sure it is lean ground beef (92% lean, 8% fat). If you do buy a fattier ground beef,  drain the fat before eating.  Buy dried beans and peas, such as lentils, split peas, or kidney beans.  For seafood, choose canned tuna, salmon, or sardines.  Eggs are a low-cost source of protein.  Buy the larger tubs of yogurt instead of individual-sized containers.  Choose water instead of sodas and other sweetened beverages.  Skip buying chips, cookies, and other "junk food". These items are usually expensive, high in calories, and low in nutritional value. How can I prepare the foods I buy in the healthiest way? Practice these tips for cooking foods in the healthiest way to reduce excess fat and calorie intake:  Steam, saute, grill, or bake foods instead of frying them.  Make sure half your plate is filled with fruits or vegetables. Choose from fresh, frozen, or canned fruits and vegetables. If eating canned, remember to rinse them before eating. This will remove any excess salt added for packaging.  Trim all fat from meat before cooking. Remove the skin from chicken or turkey.  Spoon off fat from meat dishes once they have been chilled in the refrigerator and the fat has hardened on the top.  Use skim milk, low-fat milk, or evaporated skim milk when making cream sauces, soups, or puddings.  Substitute low-fat yogurt, sour cream, or cottage cheese for sour cream and mayonnaise in dips and dressings.  Try lemon juice, herbs, or spices to season food instead of salt, butter, or margarine.   This information is not intended to replace advice given to you by your health care provider. Make sure you discuss any questions you have with your health care provider.   Document Released: 08/06/2014 Document Reviewed: 08/06/2014 [employer] Interactive Patient Education 2016 [employer]    

## 2015-11-23 NOTE — Progress Notes (Signed)
Subjective:    Patient ID: CAS TRACZ, male    DOB: [birth year], 69 y.o.   MRN: [id number]  HPI: Patient here today for follow up of chronic medical problems.   Hypertension This is a chronic problem. The current episode started more than 1 year ago. The problem is unchanged. The problem is controlled. Pertinent negatives include no blurred vision, chest pain, headaches, neck pain, palpitations or shortness of breath. There are no associated agents to hypertension. Risk factors for coronary artery disease include dyslipidemia, male gender, sedentary lifestyle and obesity. Past treatments include ACE inhibitors, diuretics and beta blockers. The current treatment provides moderate improvement. Compliance problems include diet.  There is no history of CVA or heart failure.  Hyperlipidemia This is a chronic problem. The current episode started more than 1 year ago. The problem is uncontrolled. Recent lipid tests were reviewed and are high. He has no history of diabetes, hypothyroidism or obesity. Pertinent negatives include no chest pain, myalgias or shortness of breath. Current antihyperlipidemic treatment includes statins. The current treatment provides moderate improvement of lipids. Compliance problems include adherence to diet and adherence to exercise.  Risk factors for coronary artery disease include dyslipidemia, hypertension and male sex.  Insomnia Xanax at night helps him to rest. Reports he takes one every night before bedtime to help with sleep. This is helping him to sleep all night. Constipation Takes miralax daily and this is helping with the constipation. No changes in bowl routine.    Review of Systems  Constitutional: Negative.   Eyes: Negative.  Negative for blurred vision.  Respiratory: Negative for shortness of breath.   Cardiovascular: Negative for chest pain and palpitations.  Genitourinary: Negative.   Musculoskeletal: Negative.  Negative for myalgias and neck pain.   Skin: Negative.   Neurological: Negative for headaches.  Psychiatric/Behavioral: Negative.        Objective:   Physical Exam  Constitutional: He is oriented to person, place, and time. He appears well-developed and well-nourished.  HENT:  Head: Normocephalic.  Right Ear: External ear normal.  Left Ear: External ear normal.  Nose: Nose normal.  Mouth/Throat: Oropharynx is clear and moist.  Eyes: EOM are normal. Pupils are equal, round, and reactive to light.  Neck: Normal range of motion. Neck supple. No JVD present. Carotid bruit is not present. No thyromegaly present.  Lipoma left proximal clavicle area. No nodules palpable. Nontender   Cardiovascular: Normal rate, regular rhythm, normal heart sounds and intact distal pulses.   No murmur heard. Pulmonary/Chest: Effort normal and breath sounds normal. He has no wheezes. He has no rales.  Abdominal: Soft. Bowel sounds are normal.  Musculoskeletal: Normal range of motion.  Neurological: He is alert and oriented to person, place, and time.  Skin: Skin is warm and dry.  Psychiatric: He has a normal mood and affect. His behavior is normal. Judgment and thought content normal.   BP 144/85 mmHg  Pulse 57  Temp(Src) 96.7 F (35.9 C) (Oral)  Ht '6\' 1"'  (1.854 m)  Wt 249 lb (112.946 kg)  BMI 32.86 kg/m2        Assessment & Plan:   1. Essential hypertension, benign Do not add salt to diet - metoprolol succinate (TOPROL-XL) 50 MG 24 hr tablet; Take 1 tablet (50 mg total) by mouth daily. Take with or immediately following a meal.  Dispense: 90 tablet; Refill: 1 - lisinopril-hydrochlorothiazide (PRINZIDE,ZESTORETIC) 20-12.5 MG tablet; Take 2 tablets by mouth daily.  Dispense: 180 tablet; Refill: 1 - CMP14+EGFR  2. Hyperlipidemia with target LDL less than 100 Low fat diet - pravastatin (PRAVACHOL) 20 MG tablet; Take 1 tablet (20 mg total) by mouth daily.  Dispense: 90 tablet; Refill: 1 - Lipid panel  3. Insomnia Bedtime  ritual - ALPRAZolam (XANAX) 0.25 MG tablet; TAKE 1 TABLET AT BEDTIME AS NEEDED FOR ANXIETY  Dispense: 30 tablet; Refill: 2  4. BMI 32.0-32.9,adult Discussed diet and exercise for person with BMI >25 Will recheck weight in 3-6 months  5. Prostate cancer screening - PSA, total and free  6. Screening for malignant neoplasm of the rectum - Fecal occult blood, imunochemical; Future  7. Need for hepatitis C screening test - Hepatitis C antibody    Labs pending Health maintenance reviewed Diet and exercise encouraged Continue all meds Follow up  In 3 months   [place], FNP

## 2015-11-24 LAB — CMP14+EGFR
A/G RATIO: 1.8 (ref 1.1–2.5)
ALBUMIN: 4.8 g/dL (ref 3.6–4.8)
ALT: 41 IU/L (ref 0–44)
AST: 37 IU/L (ref 0–40)
Alkaline Phosphatase: 60 IU/L (ref 39–117)
BUN / CREAT RATIO: 12 (ref 10–22)
BUN: 12 mg/dL (ref 8–27)
Bilirubin Total: 0.8 mg/dL (ref 0.0–1.2)
CALCIUM: 9.8 mg/dL (ref 8.6–10.2)
CO2: 24 mmol/L (ref 18–29)
Chloride: 86 mmol/L — ABNORMAL LOW (ref 97–106)
Creatinine, Ser: 0.99 mg/dL (ref 0.76–1.27)
GFR calc Af Amer: 90 mL/min/{1.73_m2} (ref >59)
GFR, EST NON AFRICAN AMERICAN: 78 mL/min/{1.73_m2} (ref >59)
GLOBULIN, TOTAL: 2.7 g/dL (ref 1.5–4.5)
Glucose: 86 mg/dL (ref 65–99)
POTASSIUM: 4.8 mmol/L (ref 3.5–5.2)
SODIUM: 130 mmol/L — AB (ref 136–144)
Total Protein: 7.5 g/dL (ref 6.0–8.5)

## 2015-11-24 LAB — HEPATITIS C ANTIBODY: Hep C Virus Ab: <0.1 s/co ratio (ref 0.0–0.9)

## 2015-11-24 LAB — LIPID PANEL
Chol/HDL Ratio: 2.2 ratio units (ref 0.0–5.0)
Cholesterol, Total: 150 mg/dL (ref 100–199)
HDL: 69 mg/dL (ref >39)
LDL CALC: 65 mg/dL (ref 0–99)
Triglycerides: 80 mg/dL (ref 0–149)
VLDL CHOLESTEROL CAL: 16 mg/dL (ref 5–40)

## 2015-11-24 LAB — PSA, TOTAL AND FREE
PROSTATE SPECIFIC AG, SERUM: 1.1 ng/mL (ref 0.0–4.0)
PSA FREE PCT: 26.4 %
PSA FREE: 0.29 ng/mL

## 2015-12-05 ENCOUNTER — Other Ambulatory Visit: Payer: Self-pay | Admitting: Nurse Practitioner

## 2015-12-06 NOTE — Telephone Encounter (Signed)
Last filled 10/26/15, last seen 11/23/15. [facility]

## 2015-12-06 NOTE — Telephone Encounter (Signed)
rx called into pharmacy

## 2015-12-06 NOTE — Telephone Encounter (Signed)
Please call in xanax with 1 refills 

## 2016-02-27 ENCOUNTER — Ambulatory Visit (INDEPENDENT_AMBULATORY_CARE_PROVIDER_SITE_OTHER): Payer: Commercial Managed Care - HMO | Admitting: Nurse Practitioner

## 2016-02-27 ENCOUNTER — Encounter: Payer: Self-pay | Admitting: Nurse Practitioner

## 2016-02-27 VITALS — BP 142/88 | HR 59 | Temp 96.8°F | Ht 73.0 in | Wt 251.0 lb

## 2016-02-27 DIAGNOSIS — E785 Hyperlipidemia, unspecified: Secondary | ICD-10-CM

## 2016-02-27 DIAGNOSIS — Z6832 Body mass index (BMI) 32.0-32.9, adult: Secondary | ICD-10-CM

## 2016-02-27 DIAGNOSIS — I10 Essential (primary) hypertension: Secondary | ICD-10-CM | POA: Diagnosis not present

## 2016-02-27 DIAGNOSIS — Z1212 Encounter for screening for malignant neoplasm of rectum: Secondary | ICD-10-CM | POA: Diagnosis not present

## 2016-02-27 DIAGNOSIS — G4700 Insomnia, unspecified: Secondary | ICD-10-CM

## 2016-02-27 MED ORDER — ALPRAZOLAM 0.25 MG PO TABS: ORAL_TABLET | ORAL | Status: DC

## 2016-02-27 NOTE — Progress Notes (Signed)
Subjective:    Patient ID: Daniel Barnett, male    DOB: 03-Jul-1946, 70 y.o.   MRN: [id number]  Patient here today for follow up of chronic medical problems.  Outpatient Encounter Prescriptions as of 02/27/2016  Medication Sig  . ALPRAZolam (XANAX) 0.25 MG tablet TAKE ONE (1) TABLET AT BEDTIME FOR ANXIETY  . lisinopril-hydrochlorothiazide (PRINZIDE,ZESTORETIC) 20-12.5 MG tablet Take 2 tablets by mouth daily.  . metoprolol succinate (TOPROL-XL) 50 MG 24 hr tablet Take 1 tablet (50 mg total) by mouth daily. Take with or immediately following a meal.  . pravastatin (PRAVACHOL) 20 MG tablet Take 1 tablet (20 mg total) by mouth daily.   No facility-administered encounter medications on file as of 02/27/2016.     Hypertension This is a chronic problem. The current episode started more than 1 year ago. The problem is unchanged. The problem is controlled. Pertinent negatives include no blurred vision, chest pain, headaches, neck pain, palpitations or shortness of breath. There are no associated agents to hypertension. Risk factors for coronary artery disease include dyslipidemia, male gender, sedentary lifestyle and obesity. Past treatments include ACE inhibitors, diuretics and beta blockers. The current treatment provides moderate improvement. Compliance problems include diet.  There is no history of CVA or heart failure.  Hyperlipidemia This is a chronic problem. The current episode started more than 1 year ago. The problem is uncontrolled. Recent lipid tests were reviewed and are high. He has no history of diabetes, hypothyroidism or obesity. Pertinent negatives include no chest pain, myalgias or shortness of breath. Current antihyperlipidemic treatment includes statins. The current treatment provides moderate improvement of lipids. Compliance problems include adherence to diet and adherence to exercise.  Risk factors for coronary artery disease include dyslipidemia, hypertension and male sex.   Insomnia Xanax at night helps him to rest. Reports he takes one every night before bedtime to help with sleep. This is helping him to sleep all night. Constipation Takes miralax daily and this is helping with the constipation. No changes in bowl routine.    Review of Systems  Constitutional: Negative.   Eyes: Negative.  Negative for blurred vision.  Respiratory: Negative for shortness of breath.   Cardiovascular: Negative for chest pain and palpitations.  Genitourinary: Negative.   Musculoskeletal: Negative.  Negative for myalgias and neck pain.  Skin: Negative.   Neurological: Negative for headaches.  Psychiatric/Behavioral: Negative.        Objective:   Physical Exam  Constitutional: He is oriented to person, place, and time. He appears well-developed and well-nourished.  HENT:  Head: Normocephalic.  Right Ear: External ear normal.  Left Ear: External ear normal.  Nose: Nose normal.  Mouth/Throat: Oropharynx is clear and moist.  Eyes: EOM are normal. Pupils are equal, round, and reactive to light.  Neck: Normal range of motion. Neck supple. No JVD present. Carotid bruit is not present. No thyromegaly present.  Lipoma left proximal clavicle area. No nodules palpable. Nontender   Cardiovascular: Normal rate, regular rhythm, normal heart sounds and intact distal pulses.   No murmur heard. Pulmonary/Chest: Effort normal and breath sounds normal. He has no wheezes. He has no rales.  Abdominal: Soft. Bowel sounds are normal.  Musculoskeletal: Normal range of motion.  Neurological: He is alert and oriented to person, place, and time.  Skin: Skin is warm and dry.  Psychiatric: He has a normal mood and affect. His behavior is normal. Judgment and thought content normal.   BP 142/88 mmHg  Pulse 59  Temp(Src) 96.8 F (36  C) (Oral)  Ht '6\' 1"'  (1.854 m)  Wt 251 lb (113.853 kg)  BMI 33.12 kg/m2       Assessment & Plan:   1. Essential hypertension, benign Do not add salt to  diet - CMP14+EGFR  2. Hyperlipidemia with target LDL less than 100 Low fat diet - Lipid panel  3. Insomnia Bedtime ritual - ALPRAZolam (XANAX) 0.25 MG tablet; TAKE ONE (1) TABLET AT BEDTIME FOR ANXIETY  Dispense: 30 tablet; Refill: 1  4. BMI 32.0-32.9,adult Discussed diet and exercise for person with BMI >25 Will recheck weight in 3-6 months     Labs pending Health maintenance reviewed Diet and exercise encouraged Continue all meds Follow up  In 3 months   [place], FNP

## 2016-02-27 NOTE — Addendum Note (Signed)
Addended by: Chevis Pretty on: 02/27/2016 10:34 AM   Modules accepted: Orders

## 2016-02-27 NOTE — Patient Instructions (Signed)
Insomnia Insomnia is a sleep disorder that makes it difficult to fall asleep or to stay asleep. Insomnia can cause tiredness (fatigue), low energy, difficulty concentrating, mood swings, and poor performance at work or school.  There are three different ways to classify insomnia:  Difficulty falling asleep.  Difficulty staying asleep.  Waking up too early in the morning. Any type of insomnia can be long-term (chronic) or short-term (acute). Both are common. Short-term insomnia usually lasts for three months or less. Chronic insomnia occurs at least three times a week for longer than three months. CAUSES  Insomnia may be caused by another condition, situation, or substance, such as:  Anxiety.  Certain medicines.  Gastroesophageal reflux disease (GERD) or other gastrointestinal conditions.  Asthma or other breathing conditions.  Restless legs syndrome, sleep apnea, or other sleep disorders.  Chronic pain.  Menopause. This may include hot flashes.  Stroke.  Abuse of alcohol, tobacco, or illegal drugs.  Depression.  Caffeine.   Neurological disorders, such as Alzheimer disease.  An overactive thyroid (hyperthyroidism). The cause of insomnia may not be known. RISK FACTORS Risk factors for insomnia include:  Gender. Women are more commonly affected than men.  Age. Insomnia is more common as you get older.  Stress. This may involve your professional or personal life.  Income. Insomnia is more common in people with lower income.  Lack of exercise.   Irregular work schedule or night shifts.  Traveling between different time zones. SIGNS AND SYMPTOMS If you have insomnia, trouble falling asleep or trouble staying asleep is the main symptom. This may lead to other symptoms, such as:  Feeling fatigued.  Feeling nervous about going to sleep.  Not feeling rested in the morning.  Having trouble concentrating.  Feeling irritable, anxious, or depressed. TREATMENT   Treatment for insomnia depends on the cause. If your insomnia is caused by an underlying condition, treatment will focus on addressing the condition. Treatment may also include:   Medicines to help you sleep.  Counseling or therapy.  Lifestyle adjustments. HOME CARE INSTRUCTIONS   Take medicines only as directed by your health care provider.  Keep regular sleeping and waking hours. Avoid naps.  Keep a sleep diary to help you and your health care provider figure out what could be causing your insomnia. Include:   When you sleep.  When you wake up during the night.  How well you sleep.   How rested you feel the next day.  Any side effects of medicines you are taking.  What you eat and drink.   Make your bedroom a comfortable place where it is easy to fall asleep:  Put up shades or special blackout curtains to block light from outside.  Use a white noise machine to block noise.  Keep the temperature cool.   Exercise regularly as directed by your health care provider. Avoid exercising right before bedtime.  Use relaxation techniques to manage stress. Ask your health care provider to suggest some techniques that may work well for you. These may include:  Breathing exercises.  Routines to release muscle tension.  Visualizing peaceful scenes.  Cut back on alcohol, caffeinated beverages, and cigarettes, especially close to bedtime. These can disrupt your sleep.  Do not overeat or eat spicy foods right before bedtime. This can lead to digestive discomfort that can make it hard for you to sleep.  Limit screen use before bedtime. This includes:  Watching TV.  Using your smartphone, tablet, and computer.  Stick to a routine. This   can help you fall asleep faster. Try to do a quiet activity, brush your teeth, and go to bed at the same time each night.  Get out of bed if you are still awake after 15 minutes of trying to sleep. Keep the lights down, but try reading or  doing a quiet activity. When you feel sleepy, go back to bed.  Make sure that you drive carefully. Avoid driving if you feel very sleepy.  Keep all follow-up appointments as directed by your health care provider. This is important. SEEK MEDICAL CARE IF:   You are tired throughout the day or have trouble in your daily routine due to sleepiness.  You continue to have sleep problems or your sleep problems get worse. SEEK IMMEDIATE MEDICAL CARE IF:   You have serious thoughts about hurting yourself or someone else.   This information is not intended to replace advice given to you by your health care provider. Make sure you discuss any questions you have with your health care provider.   Document Released: 11/30/2000 Document Revised: 08/24/2015 Document Reviewed: 09/03/2014 Elsevier Interactive Patient Education 2016 [employer]  

## 2016-02-27 NOTE — Addendum Note (Signed)
Addended by: Chevis Pretty on: 02/27/2016 04:12 PM   Modules accepted: Orders

## 2016-02-28 LAB — CMP14+EGFR
ALK PHOS: 55 IU/L (ref 39–117)
ALT: 42 IU/L (ref 0–44)
AST: 33 IU/L (ref 0–40)
Albumin/Globulin Ratio: 1.6 (ref 1.2–2.2)
Albumin: 4.6 g/dL (ref 3.6–4.8)
BUN / CREAT RATIO: 11 (ref 10–22)
BUN: 12 mg/dL (ref 8–27)
Bilirubin Total: 0.7 mg/dL (ref 0.0–1.2)
CHLORIDE: 89 mmol/L — AB (ref 96–106)
CO2: 22 mmol/L (ref 18–29)
CREATININE: 1.08 mg/dL (ref 0.76–1.27)
Calcium: 9.8 mg/dL (ref 8.6–10.2)
GFR calc Af Amer: 81 mL/min/{1.73_m2} (ref >59)
GFR calc non Af Amer: 70 mL/min/{1.73_m2} (ref >59)
GLUCOSE: 107 mg/dL — AB (ref 65–99)
Globulin, Total: 2.8 g/dL (ref 1.5–4.5)
Potassium: 5.4 mmol/L — ABNORMAL HIGH (ref 3.5–5.2)
Sodium: 132 mmol/L — ABNORMAL LOW (ref 134–144)
Total Protein: 7.4 g/dL (ref 6.0–8.5)

## 2016-02-28 LAB — LIPID PANEL
CHOLESTEROL TOTAL: 144 mg/dL (ref 100–199)
Chol/HDL Ratio: 2.1 ratio units (ref 0.0–5.0)
HDL: 68 mg/dL (ref >39)
LDL Calculated: 61 mg/dL (ref 0–99)
TRIGLYCERIDES: 75 mg/dL (ref 0–149)
VLDL CHOLESTEROL CAL: 15 mg/dL (ref 5–40)

## 2016-02-29 LAB — FECAL OCCULT BLOOD, IMMUNOCHEMICAL: FECAL OCCULT BLD: NEGATIVE

## 2016-04-04 ENCOUNTER — Telehealth: Payer: Self-pay | Admitting: Nurse Practitioner

## 2016-04-05 ENCOUNTER — Other Ambulatory Visit: Payer: Self-pay

## 2016-04-05 DIAGNOSIS — I10 Essential (primary) hypertension: Secondary | ICD-10-CM

## 2016-04-05 DIAGNOSIS — E785 Hyperlipidemia, unspecified: Secondary | ICD-10-CM

## 2016-04-05 MED ORDER — PRAVASTATIN SODIUM 20 MG PO TABS: 20.0000 mg | ORAL_TABLET | Freq: Every day | ORAL | Status: DC

## 2016-04-05 MED ORDER — METOPROLOL SUCCINATE ER 50 MG PO TB24: 50.0000 mg | ORAL_TABLET | Freq: Every day | ORAL | Status: DC

## 2016-04-05 MED ORDER — LISINOPRIL-HYDROCHLOROTHIAZIDE 20-12.5 MG PO TABS: 2.0000 | ORAL_TABLET | Freq: Every day | ORAL | Status: DC

## 2016-04-10 NOTE — Telephone Encounter (Signed)
noted 

## 2016-04-11 ENCOUNTER — Other Ambulatory Visit: Payer: Self-pay | Admitting: Nurse Practitioner

## 2016-04-11 DIAGNOSIS — G4700 Insomnia, unspecified: Secondary | ICD-10-CM

## 2016-04-11 MED ORDER — ALPRAZOLAM 0.25 MG PO TABS: ORAL_TABLET | ORAL | Status: DC

## 2016-06-18 ENCOUNTER — Encounter: Payer: Self-pay | Admitting: Nurse Practitioner

## 2016-06-18 ENCOUNTER — Ambulatory Visit (INDEPENDENT_AMBULATORY_CARE_PROVIDER_SITE_OTHER): Payer: Commercial Managed Care - HMO | Admitting: Nurse Practitioner

## 2016-06-18 VITALS — BP 156/90 | HR 76 | Temp 97.1°F | Ht 73.0 in | Wt 245.0 lb

## 2016-06-18 DIAGNOSIS — K5900 Constipation, unspecified: Secondary | ICD-10-CM

## 2016-06-18 DIAGNOSIS — Z6832 Body mass index (BMI) 32.0-32.9, adult: Secondary | ICD-10-CM | POA: Diagnosis not present

## 2016-06-18 DIAGNOSIS — E785 Hyperlipidemia, unspecified: Secondary | ICD-10-CM | POA: Diagnosis not present

## 2016-06-18 DIAGNOSIS — G4700 Insomnia, unspecified: Secondary | ICD-10-CM | POA: Diagnosis not present

## 2016-06-18 DIAGNOSIS — I10 Essential (primary) hypertension: Secondary | ICD-10-CM

## 2016-06-18 DIAGNOSIS — K047 Periapical abscess without sinus: Secondary | ICD-10-CM | POA: Diagnosis not present

## 2016-06-18 DIAGNOSIS — R7309 Other abnormal glucose: Secondary | ICD-10-CM | POA: Diagnosis not present

## 2016-06-18 DIAGNOSIS — R739 Hyperglycemia, unspecified: Secondary | ICD-10-CM

## 2016-06-18 MED ORDER — ALPRAZOLAM 0.25 MG PO TABS: ORAL_TABLET | ORAL | Status: DC

## 2016-06-18 MED ORDER — CLINDAMYCIN HCL 300 MG PO CAPS: 300.0000 mg | ORAL_CAPSULE | Freq: Three times a day (TID) | ORAL | Status: DC

## 2016-06-18 MED ORDER — LISINOPRIL-HYDROCHLOROTHIAZIDE 20-12.5 MG PO TABS: 2.0000 | ORAL_TABLET | Freq: Every day | ORAL | Status: DC

## 2016-06-18 MED ORDER — METOPROLOL SUCCINATE ER 50 MG PO TB24: 50.0000 mg | ORAL_TABLET | Freq: Every day | ORAL | Status: DC

## 2016-06-18 MED ORDER — PRAVASTATIN SODIUM 20 MG PO TABS: 20.0000 mg | ORAL_TABLET | Freq: Every day | ORAL | Status: DC

## 2016-06-18 NOTE — Patient Instructions (Signed)

## 2016-06-18 NOTE — Addendum Note (Signed)
Addended by: Chevis Pretty on: 06/18/2016 10:04 AM   Modules accepted: Orders

## 2016-06-18 NOTE — Progress Notes (Signed)
Subjective:    Patient ID: Daniel Barnett, male    DOB: 03-Oct-1946, 70 y.o.   MRN: [id number]  Patient here today for follow up of chronic medical problems.  Outpatient Encounter Prescriptions as of 06/18/2016  Medication Sig  . ALPRAZolam (XANAX) 0.25 MG tablet TAKE ONE (1) TABLET AT BEDTIME FOR ANXIETY  . lisinopril-hydrochlorothiazide (PRINZIDE,ZESTORETIC) 20-12.5 MG tablet Take 2 tablets by mouth daily.  . metoprolol succinate (TOPROL-XL) 50 MG 24 hr tablet Take 1 tablet (50 mg total) by mouth daily. Take with or immediately following a meal.  . pravastatin (PRAVACHOL) 20 MG tablet Take 1 tablet (20 mg total) by mouth daily.   No facility-administered encounter medications on file as of 06/18/2016.     Hypertension This is a chronic problem. The current episode started more than 1 year ago. The problem is unchanged. The problem is controlled. Pertinent negatives include no blurred vision, chest pain, headaches, neck pain, palpitations or shortness of breath. There are no associated agents to hypertension. Risk factors for coronary artery disease include dyslipidemia, male gender, sedentary lifestyle and obesity. Past treatments include ACE inhibitors, diuretics and beta blockers. The current treatment provides moderate improvement. Compliance problems include diet.  There is no history of CVA or heart failure.  Hyperlipidemia This is a chronic problem. The current episode started more than 1 year ago. The problem is uncontrolled. Recent lipid tests were reviewed and are high. He has no history of diabetes, hypothyroidism or obesity. Pertinent negatives include no chest pain, myalgias or shortness of breath. Current antihyperlipidemic treatment includes statins. The current treatment provides moderate improvement of lipids. Compliance problems include adherence to diet and adherence to exercise.  Risk factors for coronary artery disease include dyslipidemia, hypertension and male sex.   Insomnia Xanax at night helps him to rest. Reports he takes one every night before bedtime to help with sleep. This is helping him to sleep all night. Constipation Takes miralax daily and this is helping with the constipation. No changes in bowl routine.    C/O tooth ache on left bottom (#17)- could not get appoitment with dentist- he is suppose to have it removed whenever he can get appointment.  Review of Systems  Constitutional: Negative.   Eyes: Negative.  Negative for blurred vision.  Respiratory: Negative for shortness of breath.   Cardiovascular: Negative for chest pain and palpitations.  Genitourinary: Negative.   Musculoskeletal: Negative.  Negative for myalgias and neck pain.  Skin: Negative.   Neurological: Negative for headaches.  Psychiatric/Behavioral: Negative.        Objective:   Physical Exam  Constitutional: He is oriented to person, place, and time. He appears well-developed and well-nourished.  HENT:  Head: Normocephalic.  Right Ear: External ear normal.  Left Ear: External ear normal.  Nose: Nose normal.  Mouth/Throat: Oropharynx is clear and moist.  Left lower dental caries #17  Eyes: EOM are normal. Pupils are equal, round, and reactive to light.  Neck: Normal range of motion. Neck supple. No JVD present. Carotid bruit is not present. No thyromegaly present.  Lipoma left proximal clavicle area. No nodules palpable. Nontender   Cardiovascular: Normal rate, regular rhythm, normal heart sounds and intact distal pulses.   No murmur heard. Pulmonary/Chest: Effort normal and breath sounds normal. He has no wheezes. He has no rales.  Abdominal: Soft. Bowel sounds are normal.  Musculoskeletal: Normal range of motion.  Neurological: He is alert and oriented to person, place, and time.  Skin: Skin is warm and  dry.  Psychiatric: He has a normal mood and affect. His behavior is normal. Judgment and thought content normal.    BP 156/90 mmHg  Pulse 76   Temp(Src) 97.1 F (36.2 C) (Oral)  Ht _0  (1.854 m)  Wt 245 lb (111.131 kg)  BMI 32.33 kg/m2         Assessment & Plan:   1. Essential hypertension, benign Do not add salt to diet - lisinopril-hydrochlorothiazide (PRINZIDE,ZESTORETIC) 20-12.5 MG tablet; Take 2 tablets by mouth daily.  Dispense: 180 tablet; Refill: 0 - metoprolol succinate (TOPROL-XL) 50 MG 24 hr tablet; Take 1 tablet (50 mg total) by mouth daily. Take with or immediately following a meal.  Dispense: 90 tablet; Refill: 0 - CMP14+EGFR  2. Hyperlipidemia with target LDL less than 100 Low fat diet - pravastatin (PRAVACHOL) 20 MG tablet; Take 1 tablet (20 mg total) by mouth daily.  Dispense: 90 tablet; Refill: 0 - Lipid panel  3. Insomnia Bedtime ritual - ALPRAZolam (XANAX) 0.25 MG tablet; TAKE ONE (1) TABLET AT BEDTIME FOR ANXIETY  Dispense: 90 tablet; Refill: 1  4. BMI 32.0-32.9,adult Discussed diet and exercise for person with BMI >25 Will recheck weight in 3-6 months  5. Constipation, unspecified constipation type Continue miralx every other day as long as working  6. Tooth abscess Will make appointment with dentist - clindamycin (CLEOCIN) 300 MG capsule; Take 1 capsule (300 mg total) by mouth 3 (three) times daily.  Dispense: 30 capsule; Refill: 0    Labs pending Health maintenance reviewed Diet and exercise encouraged Continue all meds Follow up  In 3 month   [place], FNP

## 2016-06-19 LAB — LIPID PANEL
CHOL/HDL RATIO: 1.9 ratio (ref 0.0–5.0)
Cholesterol, Total: 150 mg/dL (ref 100–199)
HDL: 79 mg/dL (ref >39)
LDL Calculated: 58 mg/dL (ref 0–99)
TRIGLYCERIDES: 65 mg/dL (ref 0–149)
VLDL Cholesterol Cal: 13 mg/dL (ref 5–40)

## 2016-06-19 LAB — CMP14+EGFR
A/G RATIO: 1.6 (ref 1.2–2.2)
ALT: 39 IU/L (ref 0–44)
AST: 35 IU/L (ref 0–40)
Albumin: 4.6 g/dL (ref 3.6–4.8)
Alkaline Phosphatase: 57 IU/L (ref 39–117)
BILIRUBIN TOTAL: 0.7 mg/dL (ref 0.0–1.2)
BUN/Creatinine Ratio: 10 (ref 10–24)
BUN: 11 mg/dL (ref 8–27)
CALCIUM: 10 mg/dL (ref 8.6–10.2)
CHLORIDE: 87 mmol/L — AB (ref 96–106)
CO2: 23 mmol/L (ref 18–29)
Creatinine, Ser: 1.14 mg/dL (ref 0.76–1.27)
GFR, EST AFRICAN AMERICAN: 76 mL/min/{1.73_m2} (ref >59)
GFR, EST NON AFRICAN AMERICAN: 66 mL/min/{1.73_m2} (ref >59)
GLOBULIN, TOTAL: 2.8 g/dL (ref 1.5–4.5)
Glucose: 114 mg/dL — ABNORMAL HIGH (ref 65–99)
POTASSIUM: 5.3 mmol/L — AB (ref 3.5–5.2)
SODIUM: 127 mmol/L — AB (ref 134–144)
Total Protein: 7.4 g/dL (ref 6.0–8.5)

## 2016-06-20 ENCOUNTER — Other Ambulatory Visit: Payer: Self-pay

## 2016-06-20 DIAGNOSIS — R739 Hyperglycemia, unspecified: Secondary | ICD-10-CM

## 2016-06-20 DIAGNOSIS — R7309 Other abnormal glucose: Secondary | ICD-10-CM | POA: Diagnosis not present

## 2016-06-20 LAB — BAYER DCA HB A1C WAIVED: HB A1C: 5.2 % (ref <7.0)

## 2016-06-20 NOTE — Addendum Note (Signed)
Addended by: Earlene Plater on: 06/20/2016 11:43 AM   Modules accepted: Orders

## 2016-09-21 ENCOUNTER — Encounter: Payer: Self-pay | Admitting: Nurse Practitioner

## 2016-09-21 ENCOUNTER — Ambulatory Visit (INDEPENDENT_AMBULATORY_CARE_PROVIDER_SITE_OTHER): Payer: Commercial Managed Care - HMO | Admitting: Nurse Practitioner

## 2016-09-21 VITALS — BP 138/88 | HR 65 | Temp 97.5°F | Ht 73.0 in | Wt 240.0 lb

## 2016-09-21 DIAGNOSIS — Z6831 Body mass index (BMI) 31.0-31.9, adult: Secondary | ICD-10-CM | POA: Diagnosis not present

## 2016-09-21 DIAGNOSIS — E785 Hyperlipidemia, unspecified: Secondary | ICD-10-CM

## 2016-09-21 DIAGNOSIS — F5101 Primary insomnia: Secondary | ICD-10-CM

## 2016-09-21 DIAGNOSIS — K5900 Constipation, unspecified: Secondary | ICD-10-CM | POA: Diagnosis not present

## 2016-09-21 DIAGNOSIS — I10 Essential (primary) hypertension: Secondary | ICD-10-CM | POA: Diagnosis not present

## 2016-09-21 DIAGNOSIS — Z23 Encounter for immunization: Secondary | ICD-10-CM

## 2016-09-21 NOTE — Progress Notes (Signed)
Subjective:    Patient ID: Daniel Barnett, male    DOB: 10-Aug-1946, 70 y.o.   MRN: [id number]  Patient here today for follow up of chronic medical problems.  Outpatient Encounter Prescriptions as of 09/21/2016  Medication Sig  . ALPRAZolam (XANAX) 0.25 MG tablet TAKE ONE (1) TABLET AT BEDTIME FOR ANXIETY  . clindamycin (CLEOCIN) 300 MG capsule Take 1 capsule (300 mg total) by mouth 3 (three) times daily.  Marland Kitchen lisinopril-hydrochlorothiazide (PRINZIDE,ZESTORETIC) 20-12.5 MG tablet Take 2 tablets by mouth daily.  . metoprolol succinate (TOPROL-XL) 50 MG 24 hr tablet Take 1 tablet (50 mg total) by mouth daily. Take with or immediately following a meal.  . pravastatin (PRAVACHOL) 20 MG tablet Take 1 tablet (20 mg total) by mouth daily.   No facility-administered encounter medications on file as of 09/21/2016.      Hypertension  This is a chronic problem. The current episode started more than 1 year ago. The problem is unchanged. The problem is controlled. Pertinent negatives include no blurred vision, chest pain, headaches, neck pain, palpitations or shortness of breath. There are no associated agents to hypertension. Risk factors for coronary artery disease include dyslipidemia, male gender, sedentary lifestyle and obesity. Past treatments include ACE inhibitors, diuretics and beta blockers. The current treatment provides moderate improvement. Compliance problems include diet.  There is no history of CVA or heart failure.  Hyperlipidemia  This is a chronic problem. The current episode started more than 1 year ago. The problem is uncontrolled. Recent lipid tests were reviewed and are high. He has no history of diabetes, hypothyroidism or obesity. Pertinent negatives include no chest pain, myalgias or shortness of breath. Current antihyperlipidemic treatment includes statins. The current treatment provides moderate improvement of lipids. Compliance problems include adherence to diet and adherence to  exercise.  Risk factors for coronary artery disease include dyslipidemia, hypertension and male sex.  Insomnia Xanax at night helps him to rest. Reports he takes one every night before bedtime to help with sleep. This is helping him to sleep all night. Constipation Takes miralax daily and this is helping with the constipation. No changes in bowl routine.    C/O tooth ache on left bottom (#17)- could not get appoitment with dentist- he is suppose to have it removed whenever he can get appointment.  Review of Systems  Constitutional: Negative.   Eyes: Negative.  Negative for blurred vision.  Respiratory: Negative for shortness of breath.   Cardiovascular: Negative for chest pain and palpitations.  Genitourinary: Negative.   Musculoskeletal: Negative.  Negative for myalgias and neck pain.  Skin: Negative.   Neurological: Negative for headaches.  Psychiatric/Behavioral: Negative.        Objective:   Physical Exam  Constitutional: He is oriented to person, place, and time. He appears well-developed and well-nourished.  HENT:  Head: Normocephalic.  Right Ear: External ear normal.  Left Ear: External ear normal.  Nose: Nose normal.  Mouth/Throat: Oropharynx is clear and moist.  Left lower dental caries #17  Eyes: EOM are normal. Pupils are equal, round, and reactive to light.  Neck: Normal range of motion. Neck supple. No JVD present. Carotid bruit is not present. No thyromegaly present.  Lipoma left proximal clavicle area. No nodules palpable. Nontender   Cardiovascular: Normal rate, regular rhythm, normal heart sounds and intact distal pulses.   No murmur heard. Pulmonary/Chest: Effort normal and breath sounds normal. He has no wheezes. He has no rales.  Abdominal: Soft. Bowel sounds are normal.  Musculoskeletal:  Normal range of motion.  Neurological: He is alert and oriented to person, place, and time.  Skin: Skin is warm and dry.  Psychiatric: He has a normal mood and affect.  His behavior is normal. Judgment and thought content normal.    BP 138/88 (BP Location: Left Arm, Cuff Size: Large)   Pulse 65   Temp 97.5 F (36.4 C) (Oral)   Ht '6\' 1"'  (1.854 m)   Wt 240 lb (108.9 kg)   BMI 31.66 kg/m         Assessment & Plan:   1. Essential hypertension, benign Do not add salt to diet - CMP14+EGFR  2. Constipation, unspecified constipation type Continue miralax 4 days a week Increase fiber in diet  3. BMI 31.0-31.9,adult Discussed diet and exercise for person with BMI >25 Will recheck weight in 3-6 months  4. Hyperlipidemia with target LDL less than 100 Low fat diet - Lipid panel  5. Primary insomnia Bedtime routine    Labs pending Health maintenance reviewed Diet and exercise encouraged Continue all meds Follow up  In 3 months   [place], FNP

## 2016-09-21 NOTE — Addendum Note (Signed)
Addended by: Rolena Infante on: 09/21/2016 09:54 AM   Modules accepted: Orders

## 2016-09-21 NOTE — Patient Instructions (Signed)
Fall Prevention in the Home  Falls can cause injuries and can affect people from all age groups. There are many simple things that you can do to make your home safe and to help prevent falls. WHAT CAN I DO ON THE OUTSIDE OF MY HOME?  Regularly repair the edges of walkways and driveways and fix any cracks.  Remove high doorway thresholds.  Trim any shrubbery on the main path into your home.  Use bright outdoor lighting.  Clear walkways of debris and clutter, including tools and rocks.  Regularly check that handrails are securely fastened and in good repair. Both sides of any steps should have handrails.  Install guardrails along the edges of any raised decks or porches.  Have leaves, snow, and ice cleared regularly.  Use sand or salt on walkways during winter months.  In the garage, clean up any spills right away, including grease or oil spills. WHAT CAN I DO IN THE BATHROOM?  Use night lights.  Install grab bars by the toilet and in the tub and shower. Do not use towel bars as grab bars.  Use non-skid mats or decals on the floor of the tub or shower.  If you need to sit down while you are in the shower, use a plastic, non-slip stool..  Keep the floor dry. Immediately clean up any water that spills on the floor.  Remove soap buildup in the tub or shower on a regular basis.  Attach bath mats securely with double-sided non-slip rug tape.  Remove throw rugs and other tripping hazards from the floor. WHAT CAN I DO IN THE BEDROOM?  Use night lights.  Make sure that a bedside light is easy to reach.  Do not use oversized bedding that drapes onto the floor.  Have a firm chair that has side arms to use for getting dressed.  Remove throw rugs and other tripping hazards from the floor. WHAT CAN I DO IN THE KITCHEN?   Clean up any spills right away.  Avoid walking on wet floors.  Place frequently used items in easy-to-reach places.  If you need to reach for something  above you, use a sturdy step stool that has a grab bar.  Keep electrical cables out of the way.  Do not use floor polish or wax that makes floors slippery. If you have to use wax, make sure that it is non-skid floor wax.  Remove throw rugs and other tripping hazards from the floor. WHAT CAN I DO IN THE STAIRWAYS?  Do not leave any items on the stairs.  Make sure that there are handrails on both sides of the stairs. Fix handrails that are broken or loose. Make sure that handrails are as long as the stairways.  Check any carpeting to make sure that it is firmly attached to the stairs. Fix any carpet that is loose or worn.  Avoid having throw rugs at the top or bottom of stairways, or secure the rugs with carpet tape to prevent them from moving.  Make sure that you have a light switch at the top of the stairs and the bottom of the stairs. If you do not have them, have them installed. WHAT ARE SOME OTHER FALL PREVENTION TIPS?  Wear closed-toe shoes that fit well and support your feet. Wear shoes that have rubber soles or low heels.  When you use a stepladder, make sure that it is completely opened and that the sides are firmly locked. Have someone hold the ladder while you   are using it. Do not climb a closed stepladder.  Add color or contrast paint or tape to grab bars and handrails in your home. Place contrasting color strips on the first and last steps.  Use mobility aids as needed, such as canes, walkers, scooters, and crutches.  Turn on lights if it is dark. Replace any light bulbs that burn out.  Set up furniture so that there are clear paths. Keep the furniture in the same spot.  Fix any uneven floor surfaces.  Choose a carpet design that does not hide the edge of steps of a stairway.  Be aware of any and all pets.  Review your medicines with your healthcare provider. Some medicines can cause dizziness or changes in blood pressure, which increase your risk of falling. Talk  with your health care provider about other ways that you can decrease your risk of falls. This may include working with a physical therapist or trainer to improve your strength, balance, and endurance.   This information is not intended to replace advice given to you by your health care provider. Make sure you discuss any questions you have with your health care provider.   Document Released: 11/23/2002 Document Revised: 04/19/2015 Document Reviewed: 01/07/2015 Elsevier Interactive Patient Education 2016 [employer]  

## 2016-09-22 LAB — CMP14+EGFR
A/G RATIO: 1.5 (ref 1.2–2.2)
ALBUMIN: 4.4 g/dL (ref 3.6–4.8)
ALK PHOS: 80 IU/L (ref 39–117)
ALT: 30 IU/L (ref 0–44)
AST: 26 IU/L (ref 0–40)
BILIRUBIN TOTAL: 0.7 mg/dL (ref 0.0–1.2)
BUN/Creatinine Ratio: 19 (ref 10–24)
BUN: 19 mg/dL (ref 8–27)
CHLORIDE: 92 mmol/L — AB (ref 96–106)
CO2: 24 mmol/L (ref 18–29)
Calcium: 9.7 mg/dL (ref 8.6–10.2)
Creatinine, Ser: 1.01 mg/dL (ref 0.76–1.27)
GFR calc Af Amer: 87 mL/min/{1.73_m2} (ref >59)
GFR calc non Af Amer: 76 mL/min/{1.73_m2} (ref >59)
GLOBULIN, TOTAL: 3 g/dL (ref 1.5–4.5)
Glucose: 108 mg/dL — ABNORMAL HIGH (ref 65–99)
POTASSIUM: 4.6 mmol/L (ref 3.5–5.2)
SODIUM: 131 mmol/L — AB (ref 134–144)
Total Protein: 7.4 g/dL (ref 6.0–8.5)

## 2016-09-22 LAB — LIPID PANEL
CHOLESTEROL TOTAL: 151 mg/dL (ref 100–199)
Chol/HDL Ratio: 2.5 ratio units (ref 0.0–5.0)
HDL: 61 mg/dL (ref >39)
LDL CALC: 72 mg/dL (ref 0–99)
Triglycerides: 91 mg/dL (ref 0–149)
VLDL CHOLESTEROL CAL: 18 mg/dL (ref 5–40)

## 2016-09-24 DIAGNOSIS — E785 Hyperlipidemia, unspecified: Secondary | ICD-10-CM | POA: Diagnosis not present

## 2016-09-24 DIAGNOSIS — K5900 Constipation, unspecified: Secondary | ICD-10-CM | POA: Diagnosis not present

## 2016-09-24 DIAGNOSIS — Z23 Encounter for immunization: Secondary | ICD-10-CM | POA: Diagnosis not present

## 2016-09-24 DIAGNOSIS — F5101 Primary insomnia: Secondary | ICD-10-CM | POA: Diagnosis not present

## 2016-09-24 DIAGNOSIS — I10 Essential (primary) hypertension: Secondary | ICD-10-CM | POA: Diagnosis not present

## 2016-09-24 DIAGNOSIS — Z6831 Body mass index (BMI) 31.0-31.9, adult: Secondary | ICD-10-CM | POA: Diagnosis not present

## 2016-10-29 ENCOUNTER — Other Ambulatory Visit: Payer: Self-pay

## 2016-10-29 DIAGNOSIS — G4700 Insomnia, unspecified: Secondary | ICD-10-CM

## 2016-10-29 MED ORDER — ALPRAZOLAM 0.25 MG PO TABS: ORAL_TABLET | ORAL | 1 refills | Status: DC

## 2016-10-29 NOTE — Telephone Encounter (Signed)
Patient last seen in office on 10/6. Rx last filled on 08-01-16 for #90. Please advise and route to [facility]

## 2016-10-29 NOTE — Telephone Encounter (Signed)
Please call in xanax with 1 refills 

## 2016-11-12 ENCOUNTER — Other Ambulatory Visit: Payer: Self-pay | Admitting: Nurse Practitioner

## 2016-11-12 DIAGNOSIS — I10 Essential (primary) hypertension: Secondary | ICD-10-CM

## 2016-11-12 DIAGNOSIS — E785 Hyperlipidemia, unspecified: Secondary | ICD-10-CM

## 2016-11-12 MED ORDER — PRAVASTATIN SODIUM 20 MG PO TABS: 20.0000 mg | ORAL_TABLET | Freq: Every day | ORAL | 0 refills | Status: DC

## 2016-11-12 MED ORDER — METOPROLOL SUCCINATE ER 50 MG PO TB24: 50.0000 mg | ORAL_TABLET | Freq: Every day | ORAL | 0 refills | Status: DC

## 2016-11-12 MED ORDER — LISINOPRIL-HYDROCHLOROTHIAZIDE 20-12.5 MG PO TABS: 2.0000 | ORAL_TABLET | Freq: Every day | ORAL | 0 refills | Status: DC

## 2016-11-12 MED ORDER — ALLOPURINOL 100 MG PO TABS: 100.0000 mg | ORAL_TABLET | Freq: Every day | ORAL | 3 refills | Status: DC

## 2016-11-12 NOTE — Addendum Note (Signed)
Addended by: Thana Ates on: 11/12/2016 03:15 PM   Modules accepted: Orders

## 2016-11-12 NOTE — Telephone Encounter (Signed)
Allopurinol rx sent to pharmacy 

## 2016-11-12 NOTE — Telephone Encounter (Signed)
Patient aware that rx sent to pharmacy. 

## 2016-11-12 NOTE — Telephone Encounter (Signed)
Patient states that he is having a gout flare up and is requesting rx sent to the Drug store in [place]. He states that he has seen you for this in the past.

## 2016-12-25 ENCOUNTER — Encounter: Payer: Self-pay | Admitting: Nurse Practitioner

## 2016-12-25 ENCOUNTER — Ambulatory Visit (INDEPENDENT_AMBULATORY_CARE_PROVIDER_SITE_OTHER): Payer: Medicare HMO | Admitting: Nurse Practitioner

## 2016-12-25 VITALS — BP 97/65 | HR 82 | Temp 98.6°F | Ht 73.0 in | Wt 233.0 lb

## 2016-12-25 DIAGNOSIS — G4700 Insomnia, unspecified: Secondary | ICD-10-CM | POA: Diagnosis not present

## 2016-12-25 DIAGNOSIS — I10 Essential (primary) hypertension: Secondary | ICD-10-CM | POA: Diagnosis not present

## 2016-12-25 DIAGNOSIS — E785 Hyperlipidemia, unspecified: Secondary | ICD-10-CM | POA: Diagnosis not present

## 2016-12-25 DIAGNOSIS — K5900 Constipation, unspecified: Secondary | ICD-10-CM

## 2016-12-25 DIAGNOSIS — F5101 Primary insomnia: Secondary | ICD-10-CM | POA: Diagnosis not present

## 2016-12-25 DIAGNOSIS — Z6830 Body mass index (BMI) 30.0-30.9, adult: Secondary | ICD-10-CM

## 2016-12-25 DIAGNOSIS — M1A9XX0 Chronic gout, unspecified, without tophus (tophi): Secondary | ICD-10-CM

## 2016-12-25 DIAGNOSIS — Z125 Encounter for screening for malignant neoplasm of prostate: Secondary | ICD-10-CM | POA: Diagnosis not present

## 2016-12-25 MED ORDER — ALPRAZOLAM 0.25 MG PO TABS: ORAL_TABLET | ORAL | 1 refills | Status: DC

## 2016-12-25 MED ORDER — LISINOPRIL-HYDROCHLOROTHIAZIDE 20-12.5 MG PO TABS: 2.0000 | ORAL_TABLET | Freq: Every day | ORAL | 1 refills | Status: DC

## 2016-12-25 MED ORDER — ALLOPURINOL 100 MG PO TABS: 100.0000 mg | ORAL_TABLET | Freq: Every day | ORAL | 1 refills | Status: AC

## 2016-12-25 MED ORDER — PRAVASTATIN SODIUM 20 MG PO TABS: 20.0000 mg | ORAL_TABLET | Freq: Every day | ORAL | 1 refills | Status: DC

## 2016-12-25 MED ORDER — METOPROLOL SUCCINATE ER 50 MG PO TB24: 50.0000 mg | ORAL_TABLET | Freq: Every day | ORAL | 1 refills | Status: DC

## 2016-12-25 NOTE — Progress Notes (Signed)
Subjective:    Patient ID: Daniel Barnett, male    DOB: Feb 26, 1946, 71 y.o.   MRN: [id number]  Patient here today for follow up of chronic medical problems. No changes since last visit. NO complaints today.  Outpatient Encounter Prescriptions as of 12/25/2016  Medication Sig  . allopurinol (ZYLOPRIM) 100 MG tablet Take 1 tablet (100 mg total) by mouth daily.  Marland Kitchen ALPRAZolam (XANAX) 0.25 MG tablet TAKE ONE (1) TABLET AT BEDTIME FOR ANXIETY  . clindamycin (CLEOCIN) 300 MG capsule Take 1 capsule (300 mg total) by mouth 3 (three) times daily.  Marland Kitchen lisinopril-hydrochlorothiazide (PRINZIDE,ZESTORETIC) 20-12.5 MG tablet Take 2 tablets by mouth daily.  . metoprolol succinate (TOPROL-XL) 50 MG 24 hr tablet Take 1 tablet (50 mg total) by mouth daily. Take with or immediately following a meal.  . pravastatin (PRAVACHOL) 20 MG tablet Take 1 tablet (20 mg total) by mouth daily.   No facility-administered encounter medications on file as of 12/25/2016.      Hypertension  This is a chronic problem. The current episode started more than 1 year ago. The problem is unchanged. The problem is controlled. Pertinent negatives include no blurred vision, chest pain, headaches, neck pain, palpitations or shortness of breath. There are no associated agents to hypertension. Risk factors for coronary artery disease include dyslipidemia, male gender, sedentary lifestyle and obesity. Past treatments include ACE inhibitors, diuretics and beta blockers. The current treatment provides moderate improvement. Compliance problems include diet.  There is no history of CVA or heart failure.  Hyperlipidemia  This is a chronic problem. The current episode started more than 1 year ago. The problem is uncontrolled. Recent lipid tests were reviewed and are high. He has no history of diabetes, hypothyroidism or obesity. Pertinent negatives include no chest pain, myalgias or shortness of breath. Current antihyperlipidemic treatment includes  statins. The current treatment provides moderate improvement of lipids. Compliance problems include adherence to diet and adherence to exercise.  Risk factors for coronary artery disease include dyslipidemia, hypertension and male sex.  Insomnia Xanax at night helps him to rest. Reports he takes one every night before bedtime to help with sleep. This is helping him to sleep all night. Constipation Takes miralax daily and this is helping with the constipation. No changes in bowl routine.  Gout On allopurinol daily. Has not had in recent flare ups.   * C/O rash on right lower leg- slight itching- has been there for several months and is a little larger in size- Has appointment with dermatology Thursday this week  Review of Systems  Constitutional: Negative.   Eyes: Negative.  Negative for blurred vision.  Respiratory: Negative for shortness of breath.   Cardiovascular: Negative for chest pain and palpitations.  Genitourinary: Negative.   Musculoskeletal: Negative.  Negative for myalgias and neck pain.  Skin: Negative.   Neurological: Negative for headaches.  Psychiatric/Behavioral: Negative.        Objective:   Physical Exam  Constitutional: He is oriented to person, place, and time. He appears well-developed and well-nourished.  HENT:  Head: Normocephalic.  Right Ear: External ear normal.  Left Ear: External ear normal.  Nose: Nose normal.  Mouth/Throat: Oropharynx is clear and moist.  Multiple dental caries  Eyes: EOM are normal. Pupils are equal, round, and reactive to light.  Neck: Normal range of motion. Neck supple. No JVD present. Carotid bruit is not present. No thyromegaly present.  Lipoma left proximal clavicle area. No nodules palpable. Nontender   Cardiovascular: Normal rate, regular  rhythm, normal heart sounds and intact distal pulses.   No murmur heard. Pulmonary/Chest: Effort normal and breath sounds normal. He has no wheezes. He has no rales.  Abdominal: Soft.  Bowel sounds are normal.  Musculoskeletal: Normal range of motion.  Neurological: He is alert and oriented to person, place, and time.  Skin: Skin is warm and dry.  Erythematous silvery plaqued area on right calf  Psychiatric: He has a normal mood and affect. His behavior is normal. Judgment and thought content normal.    BP 97/65   Pulse 82   Temp 98.6 F (37 C) (Oral)   Ht '6\' 1"'  (1.854 m)   Wt 233 lb (105.7 kg)   BMI 30.74 kg/m        Assessment & Plan:   1. Essential hypertension, benign Low sodium diet - metoprolol succinate (TOPROL-XL) 50 MG 24 hr tablet; Take 1 tablet (50 mg total) by mouth daily. Take with or immediately following a meal.  Dispense: 90 tablet; Refill: 1 - lisinopril-hydrochlorothiazide (PRINZIDE,ZESTORETIC) 20-12.5 MG tablet; Take 2 tablets by mouth daily.  Dispense: 180 tablet; Refill: 1 - CMP14+EGFR  2. BMI 30.0-30.9,adult Discussed diet and exercise for person with BMI >25 Will recheck weight in 3-6 months  3. Hyperlipidemia with target LDL less than 100 Low fat diet - pravastatin (PRAVACHOL) 20 MG tablet; Take 1 tablet (20 mg total) by mouth daily.  Dispense: 90 tablet; Refill: 1 - Lipid panel  4. Primary insomnia Bedtime routine  5. Constipation, unspecified constipation type increase fiber in diet  6. Insomnia, unspecified type - ALPRAZolam (XANAX) 0.25 MG tablet; TAKE ONE (1) TABLET AT BEDTIME FOR ANXIETY  Dispense: 90 tablet; Refill: 1  7. Chronic gout without tophus, unspecified cause, unspecified site - allopurinol (ZYLOPRIM) 100 MG tablet; Take 1 tablet (100 mg total) by mouth daily.  Dispense: 90 tablet; Refill: 1  8. Prostate cancer screening - PSA, total and free    Labs pending Health maintenance reviewed Diet and exercise encouraged Continue all meds Follow up  In 3 months   [address], FNP

## 2016-12-25 NOTE — Patient Instructions (Signed)
Psoriasis Introduction Psoriasis is a long-term (chronic) skin condition. It causes raised, red patches (plaques) on your skin that look silvery. The red patches may show up anywhere on your body. They can be any size or shape. Psoriasis can come and go. It can range from mild to very bad. It cannot be passed from one person to another (not contagious). There is no cure for this condition, but it can be helped with treatment. Follow these instructions at home: Skin Care  Apply moisturizers to your skin as needed. Only use those that your doctor has said are okay.  Apply cool compresses to the affected areas.  Do not scratch your skin. Lifestyle  Do not use tobacco products. This includes cigarettes, chewing tobacco, and e-cigarettes. If you need help quitting, ask your doctor.  Drink little or no alcohol.  Try to lower your stress. Meditation or yoga may help.  Get sun as told by your doctor. Do not get sunburned.  Think about joining a psoriasis support group. Medicines  Take or use over-the-counter and prescription medicines only as told by your doctor.  If you were prescribed an antibiotic, take or use it as told by your doctor. Do not stop taking the antibiotic even if your condition starts to get better. General instructions  Keep a journal. Use this to help track what triggers an outbreak. Try to avoid any triggers.  See a counselor or social worker if you feel very sad, upset, or hopeless about your condition and these feelings affect your work or relationships.  Keep all follow-up visits as told by your doctor. This is important. Contact a doctor if:  Your pain gets worse.  You have more redness or warmth in the affected areas.  You have new pain or stiffness in your joints.  Your pain or stiffness in your joints gets worse.  Your nails start to break easily.  Your nails pull away from the nail bed easily.  You have a fever.  You feel very sad  (depressed). This information is not intended to replace advice given to you by your health care provider. Make sure you discuss any questions you have with your health care provider. Document Released: 01/10/2005 Document Revised: 05/10/2016 Document Reviewed: 04/20/2015  2017 Elsevier

## 2016-12-26 LAB — CMP14+EGFR
A/G RATIO: 1.7 (ref 1.2–2.2)
ALK PHOS: 67 IU/L (ref 39–117)
ALT: 24 IU/L (ref 0–44)
AST: 24 IU/L (ref 0–40)
Albumin: 4.4 g/dL (ref 3.6–4.8)
BUN/Creatinine Ratio: 13 (ref 10–24)
BUN: 17 mg/dL (ref 8–27)
Bilirubin Total: 0.6 mg/dL (ref 0.0–1.2)
CO2: 26 mmol/L (ref 18–29)
Calcium: 9.9 mg/dL (ref 8.6–10.2)
Chloride: 91 mmol/L — ABNORMAL LOW (ref 96–106)
Creatinine, Ser: 1.35 mg/dL — ABNORMAL HIGH (ref 0.76–1.27)
GFR calc Af Amer: 61 mL/min/{1.73_m2} (ref >59)
GFR calc non Af Amer: 53 mL/min/{1.73_m2} — ABNORMAL LOW (ref >59)
GLOBULIN, TOTAL: 2.6 g/dL (ref 1.5–4.5)
Glucose: 136 mg/dL — ABNORMAL HIGH (ref 65–99)
POTASSIUM: 4.5 mmol/L (ref 3.5–5.2)
SODIUM: 132 mmol/L — AB (ref 134–144)
Total Protein: 7 g/dL (ref 6.0–8.5)

## 2016-12-26 LAB — LIPID PANEL
CHOLESTEROL TOTAL: 129 mg/dL (ref 100–199)
Chol/HDL Ratio: 2.1 ratio units (ref 0.0–5.0)
HDL: 61 mg/dL (ref >39)
LDL Calculated: 54 mg/dL (ref 0–99)
TRIGLYCERIDES: 69 mg/dL (ref 0–149)
VLDL Cholesterol Cal: 14 mg/dL (ref 5–40)

## 2016-12-26 LAB — PSA, TOTAL AND FREE
PSA FREE PCT: 8.3 %
PSA, Free: 0.2 ng/mL
Prostate Specific Ag, Serum: 2.4 ng/mL (ref 0.0–4.0)

## 2017-01-01 DIAGNOSIS — D485 Neoplasm of uncertain behavior of skin: Secondary | ICD-10-CM | POA: Diagnosis not present

## 2017-01-01 DIAGNOSIS — L0100 Impetigo, unspecified: Secondary | ICD-10-CM | POA: Diagnosis not present

## 2017-01-01 DIAGNOSIS — L280 Lichen simplex chronicus: Secondary | ICD-10-CM | POA: Diagnosis not present

## 2017-01-24 DIAGNOSIS — L280 Lichen simplex chronicus: Secondary | ICD-10-CM | POA: Diagnosis not present

## 2017-02-08 ENCOUNTER — Telehealth: Payer: Self-pay | Admitting: Nurse Practitioner

## 2017-02-11 NOTE — Telephone Encounter (Signed)
Pt is having the hot flashes & aware not to take anymore allopurinol. He is having foot pain / numbness and needs something else since he is not able to take the allopurinol.

## 2017-02-11 NOTE — Telephone Encounter (Signed)
What is the name of the medication? Allopurinol for gout is giving him bad side effects. Wants something different.  Have you contacted your pharmacy to request a refill? no  Which pharmacy would you like this sent to? The drug store in [place].    Patient notified that their request is being sent to the clinical staff for review and that they should receive a call once it is complete. If they do not receive a call within 24 hours they can check with their pharmacy or our office.

## 2017-02-11 NOTE — Telephone Encounter (Signed)
Per pt, Zyloprim is causing hot flashes He wants something different Please review and advise

## 2017-02-11 NOTE — Telephone Encounter (Signed)
Don't have recent Uric acid, but per PCP's last note pt has not had a gout flare up in "awhile". Pt can try to stop allopurinol and see if he has gout flare up.

## 2017-02-12 NOTE — Telephone Encounter (Signed)
ntbs foot pain could be coming from something other then gout

## 2017-02-12 NOTE — Telephone Encounter (Signed)
Patient aware and verbalizes understanding. 

## 2017-03-25 ENCOUNTER — Encounter: Payer: Self-pay | Admitting: Nurse Practitioner

## 2017-03-25 ENCOUNTER — Encounter (INDEPENDENT_AMBULATORY_CARE_PROVIDER_SITE_OTHER): Payer: Self-pay

## 2017-03-25 ENCOUNTER — Ambulatory Visit (INDEPENDENT_AMBULATORY_CARE_PROVIDER_SITE_OTHER): Payer: Medicare HMO | Admitting: Nurse Practitioner

## 2017-03-25 VITALS — BP 123/72 | HR 57 | Temp 96.9°F | Ht 73.0 in | Wt 230.0 lb

## 2017-03-25 DIAGNOSIS — F5101 Primary insomnia: Secondary | ICD-10-CM

## 2017-03-25 DIAGNOSIS — Z6830 Body mass index (BMI) 30.0-30.9, adult: Secondary | ICD-10-CM | POA: Diagnosis not present

## 2017-03-25 DIAGNOSIS — G4700 Insomnia, unspecified: Secondary | ICD-10-CM

## 2017-03-25 DIAGNOSIS — I10 Essential (primary) hypertension: Secondary | ICD-10-CM

## 2017-03-25 DIAGNOSIS — F411 Generalized anxiety disorder: Secondary | ICD-10-CM

## 2017-03-25 DIAGNOSIS — Z1211 Encounter for screening for malignant neoplasm of colon: Secondary | ICD-10-CM | POA: Diagnosis not present

## 2017-03-25 DIAGNOSIS — Z1212 Encounter for screening for malignant neoplasm of rectum: Secondary | ICD-10-CM | POA: Diagnosis not present

## 2017-03-25 DIAGNOSIS — I7300 Raynaud's syndrome without gangrene: Secondary | ICD-10-CM

## 2017-03-25 DIAGNOSIS — E785 Hyperlipidemia, unspecified: Secondary | ICD-10-CM | POA: Diagnosis not present

## 2017-03-25 MED ORDER — ALPRAZOLAM 0.25 MG PO TABS: ORAL_TABLET | ORAL | 1 refills | Status: DC

## 2017-03-25 NOTE — Addendum Note (Signed)
Addended by: Chevis Pretty on: 03/25/2017 12:29 PM   Modules accepted: Orders

## 2017-03-25 NOTE — Progress Notes (Signed)
Subjective:    Patient ID: Daniel Barnett, male    DOB: June 03, 1946, 71 y.o.   MRN: [id number]  HPI  Daniel Barnett is here today for follow up of chronic medical problem.  Outpatient Encounter Prescriptions as of 03/25/2017  Medication Sig  . ALPRAZolam (XANAX) 0.25 MG tablet TAKE ONE (1) TABLET AT BEDTIME FOR ANXIETY  . lisinopril-hydrochlorothiazide (PRINZIDE,ZESTORETIC) 20-12.5 MG tablet Take 2 tablets by mouth daily.  . metoprolol succinate (TOPROL-XL) 50 MG 24 hr tablet Take 1 tablet (50 mg total) by mouth daily. Take with or immediately following a meal.  . pravastatin (PRAVACHOL) 20 MG tablet Take 1 tablet (20 mg total) by mouth daily.  Marland Kitchen allopurinol (ZYLOPRIM) 100 MG tablet Take 1 tablet (100 mg total) by mouth daily. (Patient not taking: Reported on 03/25/2017)   No facility-administered encounter medications on file as of 03/25/2017.     1. Essential hypertension, benign  Does not check blood pressure at home- no c/o SOB,CP or HA  2. BMI 30.0-30.9,adult  No recent weight gain or weight loss  3. Hyperlipidemia with target LDL less than 100  Patient tries to watch diet but eats what he can aford  4. Primary insomnia  Uses his xanax to help him sleep.  5.      GAD          Takes xanax 3x a day- says he gets very anxious if he does t take.  New complaints: Feels hot then cold with chills x 3 weeks     Review of Systems  Constitutional: Negative for diaphoresis.  Eyes: Negative for pain.  Respiratory: Negative for shortness of breath.   Cardiovascular: Negative for chest pain, palpitations and leg swelling.  Gastrointestinal: Negative for abdominal pain.  Endocrine: Negative for polydipsia.  Skin: Negative for rash.  Neurological: Negative for dizziness, weakness and headaches.  Hematological: Does not bruise/bleed easily.  All other systems reviewed and are negative.      Objective:   Physical Exam  Constitutional: He is oriented to person, place, and time. He  appears well-developed and well-nourished.  HENT:  Head: Normocephalic.  Right Ear: External ear normal.  Left Ear: External ear normal.  Nose: Nose normal.  Mouth/Throat: Oropharynx is clear and moist.  Eyes: EOM are normal. Pupils are equal, round, and reactive to light.  Neck: Normal range of motion. Neck supple. No JVD present. No thyromegaly present.  Cardiovascular: Normal rate, regular rhythm, normal heart sounds and intact distal pulses.  Exam reveals no gallop and no friction rub.   No murmur heard. bil hands cold to touch Blue had mottled appearing (+) [place] bil hands Hands pinked up with elevation  Pulmonary/Chest: Effort normal and breath sounds normal. No respiratory distress. He has no wheezes. He has no rales. He exhibits no tenderness.  Abdominal: Soft. Bowel sounds are normal. He exhibits no mass. There is no tenderness.  Genitourinary: Prostate normal and penis normal.  Musculoskeletal: Normal range of motion. He exhibits no edema.  Lymphadenopathy:    He has no cervical adenopathy.  Neurological: He is alert and oriented to person, place, and time. No cranial nerve deficit.  Skin: Skin is warm and dry.  Psychiatric: He has a normal mood and affect. His behavior is normal. Judgment and thought content normal.   BP 123/72   Pulse (!) 57   Temp (!) 96.9 F (36.1 C) (Oral)   Ht _0  (1.854 m)   Wt 230 lb (104.3 kg)  BMI 30.34 kg/m       Assessment & Plan:  1. Essential hypertension, benign Low sodium diet - CMP14+EGFR  2. BMI 30.0-30.9,adult Discussed diet and exercise for person with BMI >25 Will recheck weight in 3-6 months  3. Hyperlipidemia with target LDL less than 100 Low fta diet - Lipid panel  4. Primary insomnia Bedtime routine  5. GAD (generalized anxiety disorder) Stress management  6. Insomnia, unspecified type - ALPRAZolam (XANAX) 0.25 MG tablet; TAKE ONE (1) TABLET AT BEDTIME FOR ANXIETY  Dispense: 90 tablet; Refill: 1  7.  Raynaud's disease without gangrene Keep hands warm- wear gloves - Ambulatory referral to Vascular Surgery    Labs pending Health maintenance reviewed Diet and exercise encouraged Continue all meds Follow up  In 3 months   [place], FNP

## 2017-03-26 LAB — LIPID PANEL
CHOL/HDL RATIO: 2.3 ratio (ref 0.0–5.0)
CHOLESTEROL TOTAL: 141 mg/dL (ref 100–199)
HDL: 62 mg/dL (ref >39)
LDL CALC: 55 mg/dL (ref 0–99)
TRIGLYCERIDES: 120 mg/dL (ref 0–149)
VLDL CHOLESTEROL CAL: 24 mg/dL (ref 5–40)

## 2017-03-26 LAB — CMP14+EGFR
ALT: 17 IU/L (ref 0–44)
AST: 21 IU/L (ref 0–40)
Albumin/Globulin Ratio: 1.7 (ref 1.2–2.2)
Albumin: 4.3 g/dL (ref 3.6–4.8)
Alkaline Phosphatase: 65 IU/L (ref 39–117)
BUN/Creatinine Ratio: 16 (ref 10–24)
BUN: 17 mg/dL (ref 8–27)
Bilirubin Total: 0.5 mg/dL (ref 0.0–1.2)
CALCIUM: 9.6 mg/dL (ref 8.6–10.2)
CO2: 28 mmol/L (ref 18–29)
CREATININE: 1.07 mg/dL (ref 0.76–1.27)
Chloride: 89 mmol/L — ABNORMAL LOW (ref 96–106)
GFR calc Af Amer: 81 mL/min/{1.73_m2} (ref >59)
GFR, EST NON AFRICAN AMERICAN: 70 mL/min/{1.73_m2} (ref >59)
GLOBULIN, TOTAL: 2.5 g/dL (ref 1.5–4.5)
GLUCOSE: 94 mg/dL (ref 65–99)
Potassium: 5.1 mmol/L (ref 3.5–5.2)
Sodium: 131 mmol/L — ABNORMAL LOW (ref 134–144)
Total Protein: 6.8 g/dL (ref 6.0–8.5)

## 2017-05-15 ENCOUNTER — Encounter: Payer: Self-pay | Admitting: Vascular Surgery

## 2017-05-15 ENCOUNTER — Other Ambulatory Visit (HOSPITAL_COMMUNITY): Payer: Self-pay

## 2017-05-22 ENCOUNTER — Other Ambulatory Visit: Payer: Self-pay | Admitting: Nurse Practitioner

## 2017-05-22 DIAGNOSIS — I10 Essential (primary) hypertension: Secondary | ICD-10-CM

## 2017-05-22 DIAGNOSIS — E785 Hyperlipidemia, unspecified: Secondary | ICD-10-CM

## 2017-06-14 ENCOUNTER — Other Ambulatory Visit: Payer: Self-pay | Admitting: Nurse Practitioner

## 2017-06-14 DIAGNOSIS — G4700 Insomnia, unspecified: Secondary | ICD-10-CM

## 2017-06-17 NOTE — Telephone Encounter (Signed)
Last seen 03/25/17  MMM   If approved print for mail order

## 2017-06-17 NOTE — Telephone Encounter (Signed)
Please call in alprazolam with 1 refills 

## 2017-06-18 NOTE — Telephone Encounter (Signed)
LM for pt to call on which local pharm

## 2017-06-25 NOTE — Telephone Encounter (Signed)
Pt has written prescription being picked up to be filled locally

## 2017-06-28 ENCOUNTER — Ambulatory Visit: Payer: Medicare HMO | Admitting: Nurse Practitioner

## 2017-07-03 ENCOUNTER — Telehealth: Payer: Self-pay | Admitting: Nurse Practitioner

## 2017-07-03 MED ORDER — BETAMETHASONE DIPROPIONATE 0.05 % EX CREA: TOPICAL_CREAM | Freq: Two times a day (BID) | CUTANEOUS | 3 refills | Status: DC

## 2017-07-03 NOTE — Telephone Encounter (Signed)
Pt was given Betamethasone ointment Pt wants cream Ointment is too greasy Please send RX into mailorder

## 2017-07-03 NOTE — Telephone Encounter (Signed)
Pt notified of RX 

## 2017-07-03 NOTE — Telephone Encounter (Signed)
rx sent to pharmacy for cream for psoriasis

## 2017-08-06 ENCOUNTER — Telehealth: Payer: Self-pay | Admitting: Nurse Practitioner

## 2017-08-06 MED ORDER — PREDNISONE 20 MG PO TABS: ORAL_TABLET | ORAL | 0 refills | Status: DC

## 2017-08-06 NOTE — Telephone Encounter (Signed)
Please review and advise.

## 2017-08-06 NOTE — Telephone Encounter (Signed)
Pt notified of RX 

## 2017-08-06 NOTE — Telephone Encounter (Signed)
t in sort doseof prednisone for patient s psoriassi- he has no way of getting here to be sen.

## 2017-08-19 DIAGNOSIS — E871 Hypo-osmolality and hyponatremia: Secondary | ICD-10-CM | POA: Diagnosis not present

## 2017-08-19 DIAGNOSIS — R1084 Generalized abdominal pain: Secondary | ICD-10-CM | POA: Diagnosis not present

## 2017-08-19 DIAGNOSIS — N390 Urinary tract infection, site not specified: Secondary | ICD-10-CM | POA: Diagnosis not present

## 2017-08-19 DIAGNOSIS — R319 Hematuria, unspecified: Secondary | ICD-10-CM | POA: Diagnosis not present

## 2017-08-19 DIAGNOSIS — J029 Acute pharyngitis, unspecified: Secondary | ICD-10-CM | POA: Diagnosis not present

## 2017-08-19 DIAGNOSIS — E860 Dehydration: Secondary | ICD-10-CM | POA: Diagnosis not present

## 2017-08-19 DIAGNOSIS — R509 Fever, unspecified: Secondary | ICD-10-CM | POA: Diagnosis not present

## 2017-08-19 DIAGNOSIS — R5081 Fever presenting with conditions classified elsewhere: Secondary | ICD-10-CM | POA: Diagnosis not present

## 2017-08-23 ENCOUNTER — Emergency Department (HOSPITAL_COMMUNITY): Payer: Medicare HMO

## 2017-08-23 ENCOUNTER — Encounter (HOSPITAL_COMMUNITY): Payer: Self-pay

## 2017-08-23 ENCOUNTER — Inpatient Hospital Stay (HOSPITAL_COMMUNITY)
Admission: EM | Admit: 2017-08-23 | Discharge: 2017-08-29 | DRG: 394 | Disposition: A | Discharge status: Home Health | Payer: Medicare HMO | Attending: Internal Medicine | Admitting: Internal Medicine

## 2017-08-23 DIAGNOSIS — E860 Dehydration: Secondary | ICD-10-CM | POA: Diagnosis present

## 2017-08-23 DIAGNOSIS — M064 Inflammatory polyarthropathy: Secondary | ICD-10-CM | POA: Diagnosis present

## 2017-08-23 DIAGNOSIS — G4700 Insomnia, unspecified: Secondary | ICD-10-CM | POA: Diagnosis present

## 2017-08-23 DIAGNOSIS — K635 Polyp of colon: Secondary | ICD-10-CM | POA: Diagnosis not present

## 2017-08-23 DIAGNOSIS — D124 Benign neoplasm of descending colon: Secondary | ICD-10-CM | POA: Diagnosis present

## 2017-08-23 DIAGNOSIS — E871 Hypo-osmolality and hyponatremia: Secondary | ICD-10-CM | POA: Diagnosis present

## 2017-08-23 DIAGNOSIS — Z87440 Personal history of urinary (tract) infections: Secondary | ICD-10-CM

## 2017-08-23 DIAGNOSIS — R1013 Epigastric pain: Secondary | ICD-10-CM | POA: Diagnosis not present

## 2017-08-23 DIAGNOSIS — N179 Acute kidney failure, unspecified: Secondary | ICD-10-CM | POA: Diagnosis present

## 2017-08-23 DIAGNOSIS — K5900 Constipation, unspecified: Secondary | ICD-10-CM | POA: Diagnosis present

## 2017-08-23 DIAGNOSIS — D122 Benign neoplasm of ascending colon: Secondary | ICD-10-CM | POA: Diagnosis present

## 2017-08-23 DIAGNOSIS — L4050 Arthropathic psoriasis, unspecified: Secondary | ICD-10-CM | POA: Diagnosis present

## 2017-08-23 DIAGNOSIS — E785 Hyperlipidemia, unspecified: Secondary | ICD-10-CM | POA: Diagnosis present

## 2017-08-23 DIAGNOSIS — I10 Essential (primary) hypertension: Secondary | ICD-10-CM | POA: Diagnosis present

## 2017-08-23 DIAGNOSIS — R45851 Suicidal ideations: Secondary | ICD-10-CM | POA: Diagnosis present

## 2017-08-23 DIAGNOSIS — R933 Abnormal findings on diagnostic imaging of other parts of digestive tract: Secondary | ICD-10-CM | POA: Diagnosis not present

## 2017-08-23 DIAGNOSIS — R531 Weakness: Secondary | ICD-10-CM | POA: Diagnosis not present

## 2017-08-23 DIAGNOSIS — F17210 Nicotine dependence, cigarettes, uncomplicated: Secondary | ICD-10-CM | POA: Diagnosis present

## 2017-08-23 DIAGNOSIS — R1030 Lower abdominal pain, unspecified: Secondary | ICD-10-CM | POA: Diagnosis present

## 2017-08-23 DIAGNOSIS — K5730 Diverticulosis of large intestine without perforation or abscess without bleeding: Secondary | ICD-10-CM | POA: Diagnosis present

## 2017-08-23 DIAGNOSIS — K6289 Other specified diseases of anus and rectum: Principal | ICD-10-CM | POA: Diagnosis present

## 2017-08-23 DIAGNOSIS — R109 Unspecified abdominal pain: Secondary | ICD-10-CM | POA: Diagnosis not present

## 2017-08-23 DIAGNOSIS — K2970 Gastritis, unspecified, without bleeding: Secondary | ICD-10-CM | POA: Diagnosis not present

## 2017-08-23 DIAGNOSIS — R0602 Shortness of breath: Secondary | ICD-10-CM | POA: Diagnosis not present

## 2017-08-23 DIAGNOSIS — K648 Other hemorrhoids: Secondary | ICD-10-CM | POA: Diagnosis present

## 2017-08-23 DIAGNOSIS — E876 Hypokalemia: Secondary | ICD-10-CM | POA: Diagnosis present

## 2017-08-23 DIAGNOSIS — F332 Major depressive disorder, recurrent severe without psychotic features: Secondary | ICD-10-CM | POA: Diagnosis present

## 2017-08-23 DIAGNOSIS — R10814 Left lower quadrant abdominal tenderness: Secondary | ICD-10-CM | POA: Diagnosis not present

## 2017-08-23 DIAGNOSIS — M6281 Muscle weakness (generalized): Secondary | ICD-10-CM | POA: Diagnosis not present

## 2017-08-23 DIAGNOSIS — F1020 Alcohol dependence, uncomplicated: Secondary | ICD-10-CM | POA: Diagnosis present

## 2017-08-23 DIAGNOSIS — R1010 Upper abdominal pain, unspecified: Secondary | ICD-10-CM | POA: Diagnosis not present

## 2017-08-23 DIAGNOSIS — N390 Urinary tract infection, site not specified: Secondary | ICD-10-CM | POA: Diagnosis not present

## 2017-08-23 DIAGNOSIS — R9431 Abnormal electrocardiogram [ECG] [EKG]: Secondary | ICD-10-CM | POA: Diagnosis not present

## 2017-08-23 HISTORY — DX: Anxiety disorder, unspecified: F41.9

## 2017-08-23 HISTORY — DX: Psoriasis, unspecified: L40.9

## 2017-08-23 HISTORY — DX: Gout, unspecified: M10.9

## 2017-08-23 LAB — CBC WITH DIFFERENTIAL/PLATELET
BASOS PCT: 0 %
Basophils Absolute: 0 10*3/uL (ref 0.0–0.1)
EOS ABS: 0 10*3/uL (ref 0.0–0.7)
EOS PCT: 0 %
HCT: 46.3 % (ref 39.0–52.0)
Hemoglobin: 17.1 g/dL — ABNORMAL HIGH (ref 13.0–17.0)
Lymphocytes Relative: 15 %
Lymphs Abs: 1.5 10*3/uL (ref 0.7–4.0)
MCH: 33.7 pg (ref 26.0–34.0)
MCHC: 36.9 g/dL — AB (ref 30.0–36.0)
MCV: 91.3 fL (ref 78.0–100.0)
MONO ABS: 1.1 10*3/uL — AB (ref 0.1–1.0)
MONOS PCT: 12 %
Neutro Abs: 7 10*3/uL (ref 1.7–7.7)
Neutrophils Relative %: 73 %
PLATELETS: 190 10*3/uL (ref 150–400)
RBC: 5.07 MIL/uL (ref 4.22–5.81)
RDW: 12 % (ref 11.5–15.5)
WBC: 9.7 10*3/uL (ref 4.0–10.5)

## 2017-08-23 LAB — COMPREHENSIVE METABOLIC PANEL
ALT: 21 U/L (ref 17–63)
AST: 23 U/L (ref 15–41)
Albumin: 4.1 g/dL (ref 3.5–5.0)
Alkaline Phosphatase: 65 U/L (ref 38–126)
Anion gap: 14 (ref 5–15)
BUN: 33 mg/dL — ABNORMAL HIGH (ref 6–20)
CALCIUM: 9.5 mg/dL (ref 8.9–10.3)
CHLORIDE: 90 mmol/L — AB (ref 101–111)
CO2: 22 mmol/L (ref 22–32)
CREATININE: 1.24 mg/dL (ref 0.61–1.24)
GFR, EST NON AFRICAN AMERICAN: 57 mL/min — AB (ref >60)
Glucose, Bld: 89 mg/dL (ref 65–99)
Potassium: 4.4 mmol/L (ref 3.5–5.1)
SODIUM: 126 mmol/L — AB (ref 135–145)
Total Bilirubin: 1.6 mg/dL — ABNORMAL HIGH (ref 0.3–1.2)
Total Protein: 7.5 g/dL (ref 6.5–8.1)

## 2017-08-23 LAB — URINALYSIS, ROUTINE W REFLEX MICROSCOPIC
BILIRUBIN URINE: NEGATIVE
Glucose, UA: NEGATIVE mg/dL
Hgb urine dipstick: NEGATIVE
KETONES UR: 20 mg/dL — AB
Leukocytes, UA: NEGATIVE
NITRITE: NEGATIVE
PH: 5 (ref 5.0–8.0)
Protein, ur: NEGATIVE mg/dL
Specific Gravity, Urine: 1.015 (ref 1.005–1.030)

## 2017-08-23 LAB — TROPONIN I: TROPONIN I: 0.04 ng/mL — AB (ref <0.03)

## 2017-08-23 LAB — LACTIC ACID, PLASMA: Lactic Acid, Venous: 2 mmol/L (ref 0.5–1.9)

## 2017-08-23 LAB — LIPASE, BLOOD: LIPASE: 41 U/L (ref 11–51)

## 2017-08-23 MED ORDER — ASPIRIN 81 MG PO CHEW
324.0000 mg | CHEWABLE_TABLET | Freq: Once | ORAL | Status: AC
  Administered 2017-08-23: 324 mg via ORAL
  Filled 2017-08-23: qty 4

## 2017-08-23 MED ORDER — SODIUM CHLORIDE 0.9 % IV BOLUS (SEPSIS)
500.0000 mL | Freq: Once | INTRAVENOUS | Status: AC
  Administered 2017-08-23: 500 mL via INTRAVENOUS

## 2017-08-23 MED ORDER — IOPAMIDOL (ISOVUE-300) INJECTION 61%
100.0000 mL | Freq: Once | INTRAVENOUS | Status: AC | PRN
  Administered 2017-08-23: 100 mL via INTRAVENOUS

## 2017-08-23 MED ORDER — SODIUM CHLORIDE 0.9 % IV SOLN
INTRAVENOUS | Status: DC
  Administered 2017-08-23 – 2017-08-26 (×7): via INTRAVENOUS

## 2017-08-23 MED ORDER — SODIUM CHLORIDE 0.9 % IV SOLN
3.0000 g | Freq: Once | INTRAVENOUS | Status: AC
  Administered 2017-08-23: 3 g via INTRAVENOUS
  Filled 2017-08-23: qty 3

## 2017-08-23 NOTE — ED Notes (Signed)
Talked with pt.s daughter on the phone. Would like to be notified when a status on patient is determined   [name]  [phone]

## 2017-08-23 NOTE — ED Notes (Signed)
Date and time results received: 08/23/17 2136 (use smartphrase ".now" to insert current time)  Test: Lactic Critical Value: 2.0  Name of Provider Notified: Thurnell Garbe  Orders Received? Or Actions Taken?:

## 2017-08-23 NOTE — ED Notes (Signed)
CRITICAL VALUE ALERT  Critical Value:  Troponin 0.04  Date & Time Notied:  08/22/17 2010  Provider Notified: Dr. Dayna Barker   Orders Received/Actions taken: Notified MD

## 2017-08-23 NOTE — ED Provider Notes (Signed)
[facility] DEPT Provider Note   CSN: [id number] Arrival date & time: 08/23/17  1904     History   Chief Complaint Chief Complaint  Patient presents with  . Abdominal Pain    rectal discharge    HPI Daniel Barnett is a 71 y.o. male.  HPI  Pt was seen at [facility].   Per pt, c/o gradual onset and persistence of constant generalized abd "pain" for the past few days.  Has been associated with decreased PO intake. Pt states when he "stands up stool comes running out of me" and he feels "lightheaded." Pt states he has been taking miralax for constipation for the past week. Pt states he was evaluated at [facility] ED x2, dx UTI and rx keflex. States he went back because he "didn't like the side effects of keflex" so it was changed to macrobid.  Denies N/V, no fevers, no back pain, no rash, no CP/SOB, no black or blood in stools, no testicular pain/swelling, no dysuria/hematuria, no penile discharge.      Past Medical History:  Diagnosis Date  . Hyperlipidemia   . Hypertension     Patient Active Problem List   Diagnosis Date Noted  . CN (constipation) 04/18/2015  . Insomnia 04/02/2014  . BMI 30.0-30.9,adult 04/02/2014  . Essential hypertension, benign 04/13/2013  . Hyperlipidemia with target LDL less than 100 04/13/2013    Past Surgical History:  Procedure Laterality Date  . TONSILLECTOMY    . VASECTOMY        Home Medications    Prior to Admission medications   Medication Sig Start Date End Date Taking? Authorizing Provider  allopurinol (ZYLOPRIM) 100 MG tablet Take 1 tablet (100 mg total) by mouth daily. Patient not taking: Reported on 03/25/2017 12/25/16   Chevis Pretty, FNP  ALPRAZolam Duanne Moron) 0.25 MG tablet TAKE 1 TABLET AT BEDTIME  FOR  ANXIETY 06/17/17   Hassell Done, Mary-Margaret, FNP  betamethasone dipropionate (DIPROLENE) 0.05 % cream Apply topically 2 (two) times daily. 07/03/17   Chevis Pretty, FNP  lisinopril-hydrochlorothiazide (PRINZIDE,ZESTORETIC)  20-12.5 MG tablet TAKE 2 TABLETS EVERY DAY 05/22/17   Chipper Herb, MD  metoprolol succinate (TOPROL-XL) 50 MG 24 hr tablet TAKE 1 TABLET DAILY WITH OR IMMEDIATELY FOLLOWING A MEAL 05/22/17   Chipper Herb, MD  pravastatin (PRAVACHOL) 20 MG tablet TAKE 1 TABLET EVERY DAY 05/22/17   Chipper Herb, MD  predniSONE (DELTASONE) 20 MG tablet 2 po at sametime daily for 5 days 08/06/17   Chevis Pretty, FNP    Family History No family history on file.  Social History Social History  Substance Use Topics  . Smoking status: Current Every Day Smoker  . Smokeless tobacco: Never Used  . Alcohol use Yes     Allergies   Ciprofloxacin   Review of Systems Review of Systems ROS: Statement: All systems negative except as marked or noted in the HPI; Constitutional: Negative for fever and chills. ; ; Eyes: Negative for eye pain, redness and discharge. ; ; ENMT: Negative for ear pain, hoarseness, nasal congestion, sinus pressure and sore throat. ; ; Cardiovascular: Negative for chest pain, palpitations, diaphoresis, dyspnea and peripheral edema. ; ; Respiratory: Negative for cough, wheezing and stridor. ; ; Gastrointestinal: +decreased PO intake, diarrhea, abd pain. Negative for nausea, vomiting, blood in stool, hematemesis, jaundice and rectal bleeding. . ; ; Genitourinary: Negative for dysuria, flank pain and hematuria. ; ; Musculoskeletal: Negative for back pain and neck pain. Negative for swelling and trauma.; ; Genital:  No penile drainage or rash, no testicular pain or swelling, no scrotal rash or swelling. ;; Skin: Negative for pruritus, rash, abrasions, blisters, bruising and skin lesion.; ; Neuro: +lightheadedness. Negative for headache and neck stiffness. Negative for weakness, altered level of consciousness, altered mental status, extremity weakness, paresthesias, involuntary movement, seizure and syncope.       Physical Exam Updated Vital Signs BP (!) 128/94 (BP Location: Left Arm)    Pulse (!) 110   Temp 97.7 F (36.5 C) (Oral)   Resp 19   Ht 6\' 1"  (1.854 m)   Wt 101.6 kg (224 lb)   SpO2 100%   BMI 29.55 kg/m    BP 129/81 (BP Location: Left Arm)   Pulse 94   Temp 97.7 F (36.5 C) (Oral)   Resp (!) 23   Ht 6\' 1"  (1.854 m)   Wt 101.6 kg (224 lb)   SpO2 99%   BMI 29.55 kg/m     19:51:44 Orthostatic Vital Signs EW  Orthostatic Lying   BP- Lying: 119/89  Pulse- Lying: 106      Orthostatic Sitting  BP- Sitting: 113/85  Pulse- Sitting: 107      Orthostatic Standing at 0 minutes  BP- Standing at 0 minutes:  (Pt states he's not able to stand. He's too dizzy)   Pt did not stool when he stood up.    Physical Exam 1935: Physical examination:  Nursing notes reviewed; Vital signs and O2 SAT reviewed;  Constitutional: Well developed, Well nourished, In no acute distress; Head:  Normocephalic, atraumatic; Eyes: EOMI, PERRL, No scleral icterus; ENMT: Mouth and pharynx normal, Mucous membranes dry; Neck: Supple, Full range of motion, No lymphadenopathy; Cardiovascular: Tachycardic rate and rhythm, No gallop; Respiratory: Breath sounds clear & equal bilaterally, No rales, rhonchi, wheezes.  Speaking full sentences with ease, Normal respiratory effort/excursion; Chest: Nontender, Movement normal; Abdomen: Soft, +mild diffuse tenderness to palp. No rebound or guarding. Nondistended, Normal bowel sounds; Genitourinary: No CVA tenderness; Extremities: Pulses normal, No tenderness, No edema, No calf edema or asymmetry.; Neuro: AA&Ox3, tangential historian. Major CN grossly intact.  Speech clear. No gross focal motor or sensory deficits in extremities.; Skin: Color normal, Warm, Dry.   ED Treatments / Results  Labs (all labs ordered are listed, but only abnormal results are displayed)   EKG  EKG Interpretation  Date/Time:  Friday August 23 2017 19:12:43 EDT Ventricular Rate:  108 PR Interval:    QRS Duration: 88 QT Interval:  312 QTC Calculation: 419 R  Axis:   55 Text Interpretation:  Sinus tachycardia Multiple ventricular premature complexes Abnormal R-wave progression, early transition Nonspecific T abnormalities, lateral leads No old tracing to compare Confirmed by Francine Graven [phone]) on 08/23/2017 7:38:48 PM       Radiology   Procedures Procedures (including critical care time)  Medications Ordered in ED Medications  sodium chloride 0.9 % bolus 500 mL (500 mLs Intravenous New Bag/Given 08/23/17 1950)     Initial Impression / Assessment and Plan / ED Course  I have reviewed the triage vital signs and the nursing notes.  Pertinent labs & imaging results that were available during my care of the patient were reviewed by me and considered in my medical decision making (see chart for details).  MDM Reviewed: previous chart, nursing note and vitals Reviewed previous: labs and ECG Interpretation: labs, ECG, x-ray and CT scan   Results for orders placed or performed during the hospital encounter of 08/23/17  Lactic acid, plasma  Result  Value Ref Range   Lactic Acid, Venous 2.0 (HH) 0.5 - 1.9 mmol/L  CBC with Differential  Result Value Ref Range   WBC 9.7 4.0 - 10.5 K/uL   RBC 5.07 4.22 - 5.81 MIL/uL   Hemoglobin 17.1 (H) 13.0 - 17.0 g/dL   HCT 46.3 39.0 - 52.0 %   MCV 91.3 78.0 - 100.0 fL   MCH 33.7 26.0 - 34.0 pg   MCHC 36.9 (H) 30.0 - 36.0 g/dL   RDW 12.0 11.5 - 15.5 %   Platelets 190 150 - 400 K/uL   Neutrophils Relative % 73 %   Neutro Abs 7.0 1.7 - 7.7 K/uL   Lymphocytes Relative 15 %   Lymphs Abs 1.5 0.7 - 4.0 K/uL   Monocytes Relative 12 %   Monocytes Absolute 1.1 (H) 0.1 - 1.0 K/uL   Eosinophils Relative 0 %   Eosinophils Absolute 0.0 0.0 - 0.7 K/uL   Basophils Relative 0 %   Basophils Absolute 0.0 0.0 - 0.1 K/uL  Urinalysis, Routine w reflex microscopic  Result Value Ref Range   Color, Urine YELLOW YELLOW   APPearance HAZY (A) CLEAR   Specific Gravity, Urine 1.015 1.005 - 1.030   pH 5.0 5.0 - 8.0    Glucose, UA NEGATIVE NEGATIVE mg/dL   Hgb urine dipstick NEGATIVE NEGATIVE   Bilirubin Urine NEGATIVE NEGATIVE   Ketones, ur 20 (A) NEGATIVE mg/dL   Protein, ur NEGATIVE NEGATIVE mg/dL   Nitrite NEGATIVE NEGATIVE   Leukocytes, UA NEGATIVE NEGATIVE    2200:   Pt stated he "felt lightheaded" when he stood for orthostatic VS; IVF NS bolus given with improvement in tachycardia. Pt has not stooled while in the ED, including when he stood up.  Udip without infection. Labs, CT and CXR pending. Dispo based on results. Sign out to Dr. Dayna [facility].     Final Clinical Impressions(s) / ED Diagnoses   Final diagnoses:  None    New Prescriptions New Prescriptions   No medications on file     Francine Graven, DO 08/23/17 2200

## 2017-08-23 NOTE — ED Provider Notes (Addendum)
11:52 PM Assumed care from Dr. Thurnell Garbe, please see their note for full history, physical and decision making until this point. In brief this is a 71 y.o. year old male who presented to the ED tonight with Abdominal Pain (rectal discharge)     Here for diarrhea. Likely slightly dehydrated. Will fluid hydrate. Check labs and CT and dispo appropriately.   He was found to have proctitis, hyponatremia and the minimally positive troponin likely secondary to demand. Even after fluids patient is still very lightheaded when he stands up and can't walk. I think the patient still is slightly dehydrated probably needs further replenishment of fluid so we will admit to the hospital for the same and to trend troponins and make sure that the sodium improves.  Labs, studies and imaging reviewed by myself and considered in medical decision making if ordered. Imaging interpreted by radiology.  Labs Reviewed  COMPREHENSIVE METABOLIC PANEL - Abnormal; Notable for the following:       Result Value   Sodium 126 (*)    Chloride 90 (*)    BUN 33 (*)    Total Bilirubin 1.6 (*)    GFR calc non Af Amer 57 (*)    All other components within normal limits  TROPONIN I - Abnormal; Notable for the following:    Troponin I 0.04 (*)    All other components within normal limits  LACTIC ACID, PLASMA - Abnormal; Notable for the following:    Lactic Acid, Venous 2.0 (*)    All other components within normal limits  CBC WITH DIFFERENTIAL/PLATELET - Abnormal; Notable for the following:    Hemoglobin 17.1 (*)    MCHC 36.9 (*)    Monocytes Absolute 1.1 (*)    All other components within normal limits  URINALYSIS, ROUTINE W REFLEX MICROSCOPIC - Abnormal; Notable for the following:    APPearance HAZY (*)    Ketones, ur 20 (*)    All other components within normal limits  URINE CULTURE  LIPASE, BLOOD  LACTIC ACID, PLASMA    CT Abdomen Pelvis W Contrast  Final Result    DG Chest 2 View  Final Result      No  Follow-up on file.    Merrily Pew, MD 08/23/17 2352    Merrily Pew, MD 08/23/17 [phone]

## 2017-08-23 NOTE — ED Notes (Signed)
CT and xray waiting for labs to come back before they take pt

## 2017-08-23 NOTE — ED Triage Notes (Addendum)
Pt went to [facility] and received Keflex. Pt did not like the side effects of Keflex so was changed to  [place] for a UTI.  Every time pt stands up states discharge and feces comes out of his rectum. Abdominal pain as well.  VSS except for HR. Sinus Tach. Hasn't eaten in a few days and has not been able to tolerate fluids.

## 2017-08-24 ENCOUNTER — Encounter (HOSPITAL_COMMUNITY): Payer: Self-pay

## 2017-08-24 DIAGNOSIS — R109 Unspecified abdominal pain: Secondary | ICD-10-CM | POA: Diagnosis not present

## 2017-08-24 DIAGNOSIS — E871 Hypo-osmolality and hyponatremia: Secondary | ICD-10-CM | POA: Diagnosis not present

## 2017-08-24 DIAGNOSIS — K6289 Other specified diseases of anus and rectum: Secondary | ICD-10-CM | POA: Diagnosis not present

## 2017-08-24 LAB — BASIC METABOLIC PANEL
ANION GAP: 11 (ref 5–15)
BUN: 24 mg/dL — ABNORMAL HIGH (ref 6–20)
CALCIUM: 8.6 mg/dL — AB (ref 8.9–10.3)
CO2: 23 mmol/L (ref 22–32)
Chloride: 93 mmol/L — ABNORMAL LOW (ref 101–111)
Creatinine, Ser: 0.96 mg/dL (ref 0.61–1.24)
GLUCOSE: 74 mg/dL (ref 65–99)
Potassium: 3.5 mmol/L (ref 3.5–5.1)
Sodium: 127 mmol/L — ABNORMAL LOW (ref 135–145)

## 2017-08-24 LAB — CBC
HCT: 40.8 % (ref 39.0–52.0)
HEMOGLOBIN: 14.9 g/dL (ref 13.0–17.0)
MCH: 33.7 pg (ref 26.0–34.0)
MCHC: 36.5 g/dL — ABNORMAL HIGH (ref 30.0–36.0)
MCV: 92.3 fL (ref 78.0–100.0)
PLATELETS: 207 10*3/uL (ref 150–400)
RBC: 4.42 MIL/uL (ref 4.22–5.81)
RDW: 12 % (ref 11.5–15.5)
WBC: 9.3 10*3/uL (ref 4.0–10.5)

## 2017-08-24 LAB — LACTIC ACID, PLASMA: Lactic Acid, Venous: 1.1 mmol/L (ref 0.5–1.9)

## 2017-08-24 MED ORDER — SODIUM CHLORIDE 0.9 % IV SOLN: 3.0000 g | Freq: Four times a day (QID) | INTRAVENOUS | Status: DC

## 2017-08-24 MED ORDER — AMPICILLIN-SULBACTAM SODIUM 3 (2-1) G IJ SOLR
3.0000 g | Freq: Once | INTRAMUSCULAR | Status: AC
  Administered 2017-08-24: 3 g via INTRAVENOUS
  Filled 2017-08-24 (×2): qty 3

## 2017-08-24 MED ORDER — ZOLPIDEM TARTRATE 5 MG PO TABS
5.0000 mg | ORAL_TABLET | Freq: Every evening | ORAL | Status: DC | PRN
  Administered 2017-08-24 – 2017-08-28 (×4): 5 mg via ORAL
  Filled 2017-08-24 (×4): qty 1

## 2017-08-24 MED ORDER — METOPROLOL SUCCINATE ER 25 MG PO TB24
25.0000 mg | ORAL_TABLET | Freq: Every day | ORAL | Status: DC
  Administered 2017-08-24 – 2017-08-29 (×5): 25 mg via ORAL
  Filled 2017-08-24 (×6): qty 1

## 2017-08-24 MED ORDER — SODIUM CHLORIDE 0.9 % IV SOLN
3.0000 g | Freq: Three times a day (TID) | INTRAVENOUS | Status: DC
  Administered 2017-08-24 – 2017-08-26 (×7): 3 g via INTRAVENOUS
  Filled 2017-08-24 (×10): qty 3

## 2017-08-24 MED ORDER — ALPRAZOLAM 0.25 MG PO TABS
0.2500 mg | ORAL_TABLET | Freq: Every day | ORAL | Status: DC
  Administered 2017-08-24 – 2017-08-28 (×5): 0.25 mg via ORAL
  Filled 2017-08-24 (×5): qty 1

## 2017-08-24 NOTE — Progress Notes (Signed)
Patient stated  "I got my .22 out to shoot myself.  I put it in my mouth and couldn't get the safety off.  If I was home and could get the safety off I would shoot myself because I am tired of being sick and depressed all the time."  Contacted MD with this info and a safety/suicide sitter was ordered.

## 2017-08-24 NOTE — Progress Notes (Signed)
ANTIBIOTIC CONSULT NOTE  Pharmacy Consult for Unasyn Indication: Intra-abdominal infection  Allergies  Allergen Reactions  . Ciprofloxacin    Patient Measurements: Height: 6\' 1"  (185.4 cm) Weight: 224 lb (101.6 kg) IBW/kg (Calculated) : 79.9  Vital Signs: BP: 120/80 (09/08 0005) Pulse Rate: 89 (09/08 0005)  Labs:  Recent Labs  08/23/17 2045 08/24/17 0556  WBC 9.7 9.3  HGB 17.1* 14.9  PLT 190 207  CREATININE 1.24 0.96   Estimated Creatinine Clearance: 89.7 mL/min (by C-G formula based on SCr of 0.96 mg/dL).  No results for input(s): VANCOTROUGH, VANCOPEAK, VANCORANDOM, GENTTROUGH, GENTPEAK, GENTRANDOM, TOBRATROUGH, TOBRAPEAK, TOBRARND, AMIKACINPEAK, AMIKACINTROU, AMIKACIN in the last 72 hours.   Microbiology: No results found for this or any previous visit (from the past 720 hour(s)).  Medical History: Past Medical History:  Diagnosis Date  . Hyperlipidemia   . Hypertension    Medications:  Unasyn 3 Gm IV x 1 dose in the ED  Assessment: 71 yo male with history of abdominal pain x several days. Pt has diarrhea, is dehydrated, and has evidence of proctitis. Pharmacy has been asked to dose Unasyn.  Goal of Therapy:  Eradicated infection  Plan: Unasyn 3gm IV q8h Monitor labs, progress, c/s  Ena Dawley, RPH 08/24/2017,10:04 AM

## 2017-08-24 NOTE — Progress Notes (Signed)
ANTIBIOTIC CONSULT NOTE-Preliminary  Pharmacy Consult for Unasyn Indication: Intra-abdominal infection  Allergies  Allergen Reactions  . Ciprofloxacin     Patient Measurements: Height: 6\' 1"  (185.4 cm) Weight: 224 lb (101.6 kg) IBW/kg (Calculated) : 79.9  Vital Signs: Temp: 97.7 F (36.5 C) (09/07 1910) Temp Source: Oral (09/07 1910) BP: 120/80 (09/08 0005) Pulse Rate: 89 (09/08 0005)  Labs:  Recent Labs  08/23/17 2045  WBC 9.7  HGB 17.1*  PLT 190  CREATININE 1.24    Estimated Creatinine Clearance: 69.5 mL/min (by C-G formula based on SCr of 1.24 mg/dL).  No results for input(s): VANCOTROUGH, VANCOPEAK, VANCORANDOM, GENTTROUGH, GENTPEAK, GENTRANDOM, TOBRATROUGH, TOBRAPEAK, TOBRARND, AMIKACINPEAK, AMIKACINTROU, AMIKACIN in the last 72 hours.   Microbiology: No results found for this or any previous visit (from the past 720 hour(s)).  Medical History: Past Medical History:  Diagnosis Date  . Hyperlipidemia   . Hypertension     Medications:  Unasyn 3 Gm IV x 1 dose in the ED  Assessment: 71 yo male with history of abdominal pain x several days. Pt has diarrhea, is dehydrated, and has evidence of proctitis. Pharmacy has been asked to dose Unasyn.  Goal of Therapy:  Eradicated infection  Plan:  Preliminary review of pertinent patient information completed.  Protocol will be initiated with dose of Unasyn 3 Gm IV @8  hours after the initial ED dose.[place] clinical pharmacist will complete review during morning rounds to assess patient and finalize treatment regimen if needed.  Norberto Sorenson, [facility] 08/24/2017,2:20 AM

## 2017-08-24 NOTE — Progress Notes (Signed)
[place] TEAM 2  Daniel Barnett  WEX:[id number] DOB: 02-25-1946 DOA: 08/23/2017 PCP: Chevis Pretty, FNP    Brief Narrative:  71 y.o. male with a history of HTN, HLD, freq UTIs, and chronic constipation who presented w/ 6 months of constipation and having trouble urinating.  In the ED he was found to have proctitis.    Subjective: The patient reports that he still feels that his "thinking is a little bit off."  He feels that he is "a little bit slow right now."  He denies chest pain nausea vomiting or abdominal pain.  He does report continued watery loose stools.  Assessment & Plan:  Proctitis - Abdom pain - can't rule out rectal CA Continue empiric antibiotic therapy - patient will need sigmoidoscopy with biopsies when he has stabilized  Hyponatremia - Dehydration  Continue volume resuscitation - recheck lites in a.m.  Acute kidney injury GFR < 60 at presentation - most consistent with prerenal azotemia -follow trend   HTN Blood pressure currently well controlled  Suicidal ideation  Reportedly told ER RN he wanted to die - some reports of actually attempting to shoot himself prior to coming to the ED - will need Psych eval while in hospital  DVT prophylaxis: SCDs Code Status: FULL CODE Family Communication: no family present at time of exam  Disposition Plan: not ready for d/c   Consultants:  none  Procedures: none  Antimicrobials:  unasyn 9/7 >  Objective: Blood pressure 120/80, pulse 89, temperature 97.7 F (36.5 C), temperature source Oral, resp. rate 16, height 6\' 1"  (1.854 m), weight 101.6 kg (224 lb), SpO2 99 %.  Intake/Output Summary (Last 24 hours) at 08/24/17 0930 Last data filed at 08/24/17 0356  Gross per 24 hour  Intake          1423.33 ml  Output              550 ml  Net           873.33 ml   Filed Weights   08/23/17 1907  Weight: 101.6 kg (224 lb)    Examination: General: No acute respiratory distress Lungs: Clear to auscultation  bilaterally without wheezes or crackles Cardiovascular: Regular rate and rhythm without murmur gallop or rub normal S1 and S2 Abdomen: Nontender, nondistended, soft, bowel sounds positive, no rebound, no ascites, no appreciable mass Extremities: No significant cyanosis, clubbing, or edema bilateral lower extremities  CBC:  Recent Labs Lab 08/23/17 2045  WBC 9.7  NEUTROABS 7.0  HGB 17.1*  HCT 46.3  MCV 91.3  PLT 938   Basic Metabolic Panel:  Recent Labs Lab 08/23/17 2045 08/24/17 0556  NA 126* 127*  K 4.4 3.5  CL 90* 93*  CO2 22 23  GLUCOSE 89 74  BUN 33* 24*  CREATININE 1.24 0.96  CALCIUM 9.5 8.6*   GFR: Estimated Creatinine Clearance: 89.7 mL/min (by C-G formula based on SCr of 0.96 mg/dL).  Liver Function Tests:  Recent Labs Lab 08/23/17 2045  AST 23  ALT 21  ALKPHOS 65  BILITOT 1.6*  PROT 7.5  ALBUMIN 4.1    Recent Labs Lab 08/23/17 2045  LIPASE 41   Cardiac Enzymes:  Recent Labs Lab 08/23/17 2045  TROPONINI 0.04*    Scheduled Meds: Continuous Infusions: . sodium chloride 100 mL/hr at 08/24/17 0922  . ampicillin-sulbactam (UNASYN) IV 3 g (08/24/17 0923)  . ampicillin-sulbactam (UNASYN) IV       LOS: 0 days   Cherene Altes, MD [facility] Office  [phone] Pager - Text Page per Shea Evans as per below:  On-Call/Text Page:      [email or web address]      password [email or web address]  If 7PM-7AM, please contact night-coverage [email or web address] Password [email or web address] 08/24/2017, 9:30 AM

## 2017-08-24 NOTE — Progress Notes (Signed)
Patient has been referred to the following psychiatric inpatient treatment facilities: [facility], [place], [place], [place], [place], [place], and [place].  At capacity: [facility], [place], [place], [place], [facility].  CSW in disposition will continue to seek placement.  Verlon Setting, MSW, LCSWA Clinical social worker in [place], [place] [facility]
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
## 2017-08-24 NOTE — H&P (Signed)
History and Physical    Daniel Barnett HQP:[id number] DOB: [birth year] DOA: 08/23/2017  PCP: Chevis Pretty, FNP  Patient coming from:  home  Chief Complaint:  Cant pee, cant poop, lower abd pain  HPI: Daniel Barnett is a 71 y.o. male with medical history significant of htn, hld, freq uti, constipation comes in from home with 6 months of having constipation and having trouble urinating.  He has gotten so sick of the lower abdominal pain.  He says he came [place] he was "in a state of confusion".  Pt reports he lives alone.  He feels like dying cuz hes sick of all his medicalproblems.  He lives alone.  hes crying during the interview and is frustrating.  Denies any fevers.  No hematuria but pain with trying to urinate worse than normal.  Pt found to have proctitis and referred for admission fo such.     Review of Systems: As per HPI otherwise 10 point review of systems negative.   Past Medical History:  Diagnosis Date  . Hyperlipidemia   . Hypertension     Past Surgical History:  Procedure Laterality Date  . TONSILLECTOMY    . VASECTOMY       reports that he has been smoking.  He has never used smokeless tobacco. He reports that he drinks alcohol. He reports that he does not use drugs.  Allergies  Allergen Reactions  . Ciprofloxacin     No family history on file. no premature CAD  Prior to Admission medications   Medication Sig Start Date End Date Taking? Authorizing Provider  allopurinol (ZYLOPRIM) 100 MG tablet Take 1 tablet (100 mg total) by mouth daily. Patient not taking: Reported on 03/25/2017 12/25/16   Chevis Pretty, FNP  ALPRAZolam Duanne Moron) 0.25 MG tablet TAKE 1 TABLET AT BEDTIME  FOR  ANXIETY 06/17/17   Hassell Done, Mary-Margaret, FNP  betamethasone dipropionate (DIPROLENE) 0.05 % cream Apply topically 2 (two) times daily. 07/03/17   Chevis Pretty, FNP  lisinopril-hydrochlorothiazide (PRINZIDE,ZESTORETIC) 20-12.5 MG tablet TAKE 2 TABLETS EVERY DAY 05/22/17    Chipper Herb, MD  metoprolol succinate (TOPROL-XL) 50 MG 24 hr tablet TAKE 1 TABLET DAILY WITH OR IMMEDIATELY FOLLOWING A MEAL 05/22/17   Chipper Herb, MD  nitrofurantoin, macrocrystal-monohydrate, (MACROBID) 100 MG capsule Take 1 capsule by mouth 2 (two) times daily. 08/21/17   [provider]  pravastatin (PRAVACHOL) 20 MG tablet TAKE 1 TABLET EVERY DAY 05/22/17   Chipper Herb, MD  predniSONE (DELTASONE) 20 MG tablet 2 po at sametime daily for 5 days 08/06/17   Chevis Pretty, FNP    Physical Exam: Vitals:   08/23/17 1907 08/23/17 1910 08/23/17 2058 08/24/17 0005  BP:  (!) 128/94 129/81 120/80  Pulse:  (!) 110 94 89  Resp:  19 (!) 23 16  Temp:  97.7 F (36.5 C)    TempSrc:  Oral    SpO2:  100% 99% 99%  Weight: 101.6 kg (224 lb)     Height: 6\' 1"  (1.854 m)         Constitutional: NAD, calm, comfortable, crying during interview Vitals:   08/23/17 1907 08/23/17 1910 08/23/17 2058 08/24/17 0005  BP:  (!) 128/94 129/81 120/80  Pulse:  (!) 110 94 89  Resp:  19 (!) 23 16  Temp:  97.7 F (36.5 C)    TempSrc:  Oral    SpO2:  100% 99% 99%  Weight: 101.6 kg (224 lb)     Height: 6\' 1"  (  1.854 m)      Eyes: PERRL, lids and conjunctivae normal ENMT: Mucous membranes are moist. Posterior pharynx clear of any exudate or lesions.Normal dentition.  Neck: normal, supple, no masses, no thyromegaly Respiratory: clear to auscultation bilaterally, no wheezing, no crackles. Normal respiratory effort. No accessory muscle use.  Cardiovascular: Regular rate and rhythm, no murmurs / rubs / gallops. No extremity edema. 2+ pedal pulses. No carotid bruits.  Abdomen: no tenderness, no masses palpated. No hepatosplenomegaly. Bowel sounds positive.  Musculoskeletal: no clubbing / cyanosis. No joint deformity upper and lower extremities. Good ROM, no contractures. Normal muscle tone.  Skin: no rashes, lesions, ulcers. No induration Neurologic: CN 2-12 grossly intact. Sensation intact,  DTR normal. Strength 5/5 in all 4.  Psychiatric: Normal judgment and insight. Alert and oriented x 3. Normal mood.    Labs on Admission: I have personally reviewed following labs and imaging studies  CBC:  Recent Labs Lab 08/23/17 2045  WBC 9.7  NEUTROABS 7.0  HGB 17.1*  HCT 46.3  MCV 91.3  PLT 935   Basic Metabolic Panel:  Recent Labs Lab 08/23/17 2045  NA 126*  K 4.4  CL 90*  CO2 22  GLUCOSE 89  BUN 33*  CREATININE 1.24  CALCIUM 9.5   GFR: Estimated Creatinine Clearance: 69.5 mL/min (by C-G formula based on SCr of 1.24 mg/dL). Liver Function Tests:  Recent Labs Lab 08/23/17 2045  AST 23  ALT 21  ALKPHOS 65  BILITOT 1.6*  PROT 7.5  ALBUMIN 4.1    Recent Labs Lab 08/23/17 2045  LIPASE 41   Cardiac Enzymes:  Recent Labs Lab 08/23/17 2045  TROPONINI 0.04*   Urine analysis:    Component Value Date/Time   COLORURINE YELLOW 08/23/2017 1952   APPEARANCEUR HAZY (A) 08/23/2017 1952   LABSPEC 1.015 08/23/2017 1952   PHURINE 5.0 08/23/2017 1952   GLUCOSEU NEGATIVE 08/23/2017 1952   HGBUR NEGATIVE 08/23/2017 1952   BILIRUBINUR NEGATIVE 08/23/2017 1952   KETONESUR 20 (A) 08/23/2017 1952   PROTEINUR NEGATIVE 08/23/2017 1952   NITRITE NEGATIVE 08/23/2017 1952   LEUKOCYTESUR NEGATIVE 08/23/2017 1952    Radiological Exams on Admission: Dg Chest 2 View  Result Date: 08/23/2017 CLINICAL DATA:  Fecal incontinence, shortness of breath. EXAM: CHEST  2 VIEW COMPARISON:  Chest radiograph August 19, 2017 FINDINGS: Cardiomediastinal silhouette is normal. Mildly calcified aortic knob. No pleural effusions or focal consolidations. Trachea projects midline and there is no pneumothorax. Soft tissue planes and included osseous structures are non-suspicious. Moderate acromioclavicular osteoarthrosis. Moderate degenerative change of the spine. IMPRESSION: No acute cardiopulmonary process. Aortic Atherosclerosis (ICD10-I70.0). Electronically Signed   By: Elon Alas M.D.   On: 08/23/2017 22:44   Ct Abdomen Pelvis W Contrast  Result Date: 08/23/2017 CLINICAL DATA:  Unspecified abdominal pain. Patient being treated for urinary tract infection. Every time patient stands up, discharge and feces, out of the rectum. EXAM: CT ABDOMEN AND PELVIS WITH CONTRAST TECHNIQUE: Multidetector CT imaging of the abdomen and pelvis was performed using the standard protocol following bolus administration of intravenous contrast. CONTRAST:  165mL ISOVUE-300 IOPAMIDOL (ISOVUE-300) INJECTION 61% COMPARISON:  None. FINDINGS: Lower chest: No acute abnormality.  Coronary artery calcifications. Hepatobiliary: No focal liver abnormality is seen. No gallstones, gallbladder wall thickening, or biliary dilatation. Pancreas: Unremarkable. No pancreatic ductal dilatation or surrounding inflammatory changes. Spleen: Normal in size without focal abnormality. Adrenals/Urinary Tract: No adrenal gland nodules. Renal nephrograms are symmetrical. No solid mass or hydronephrosis. Bladder wall is diffusely thickened consistent with  history of urinary tract infection. No intraluminal filling defects. There is a diverticulum arising from the left posterior bladder wall. Stomach/Bowel: Diffuse thickening of the rectal wall with slight asymmetry. Mild stranding in the perirectal fat. This probably represents proctitis in the setting of infection but colon cancer is not excluded and follow-up evaluation is recommended after resolution of the acute process. Consider follow-up with proctoscopy or sigmoidoscopy. Scattered stool in the colon. No colonic distention. Appendix is normal. Stomach and small bowel are not abnormally distended. Vascular/Lymphatic: Aortic atherosclerosis. No enlarged abdominal or pelvic lymph nodes. Calcification and noncalcific plaque formation demonstrated in the origin of the superior mesenteric artery suggesting proximal stenosis. The distal vessel is patent. Reproductive: Prostate is  unremarkable. Other: No free air or free fluid in the abdomen. No abdominal or pelvic abscesses. Abdominal wall musculature appears intact. Musculoskeletal: Degenerative changes in the spine. No acute displaced fractures identified. Spondylolysis with mild spondylolisthesis at L5-S1. IMPRESSION: 1. Bladder wall thickening consistent with history of urinary tract infection. Bladder diverticulum. 2. Rectal wall thickening with some asymmetry and peripheral stranding. This likely represents proctitis but rectal cancer is not excluded and follow-up is recommended after resolution of acute process. 3. Prominent aortic atherosclerosis. 4. 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 Electronically Signed   By: Lucienne Capers M.D.   On: 08/23/2017 23:00    Assessment/Plan 71 yo male with proctitis  Principal Problem:   Proctitis- iv unasyn.  urince cx obtained.  Active Problems:   Essential hypertension, benign- noted   Abdominal pain- due to infection, query rectal mass on ct, will need GI follow up once infection treated   Hyponatremia- ns ivf   Suicidal ideations - nurse reports he told her he tried to pull the trigger of a gun yesterday but it did not go off, he denies this when I ask him about it.  He is clearly depressed, says he just want to die but denies to me ever trying to kill himself or having a clear plan.  Place on sitter for now.  Hopefully we can get him to feeling better with treating his above issues.    DVT prophylaxis:  scds Code Status:  full Family Communication:  none Disposition Plan:  Per day team Consults called:  none Admission status:  observation   Soul Hackman A MD [facility]  If 7PM-7AM, please contact night-coverage [email or web address] Password [facility]  08/24/2017, 12:39 AM

## 2017-08-25 DIAGNOSIS — K6289 Other specified diseases of anus and rectum: Secondary | ICD-10-CM | POA: Diagnosis not present

## 2017-08-25 LAB — PHOSPHORUS: PHOSPHORUS: 2.4 mg/dL — AB (ref 2.5–4.6)

## 2017-08-25 LAB — COMPREHENSIVE METABOLIC PANEL
ALK PHOS: 53 U/L (ref 38–126)
ALT: 17 U/L (ref 17–63)
ANION GAP: 7 (ref 5–15)
AST: 47 U/L — ABNORMAL HIGH (ref 15–41)
Albumin: 3.2 g/dL — ABNORMAL LOW (ref 3.5–5.0)
BILIRUBIN TOTAL: 1 mg/dL (ref 0.3–1.2)
BUN: 14 mg/dL (ref 6–20)
CALCIUM: 8.5 mg/dL — AB (ref 8.9–10.3)
CO2: 26 mmol/L (ref 22–32)
Chloride: 99 mmol/L — ABNORMAL LOW (ref 101–111)
Creatinine, Ser: 0.71 mg/dL (ref 0.61–1.24)
GFR calc non Af Amer: >60 mL/min (ref >60)
Glucose, Bld: 84 mg/dL (ref 65–99)
POTASSIUM: 3.5 mmol/L (ref 3.5–5.1)
Sodium: 132 mmol/L — ABNORMAL LOW (ref 135–145)
TOTAL PROTEIN: 6 g/dL — AB (ref 6.5–8.1)

## 2017-08-25 LAB — URINE CULTURE
CULTURE: NO GROWTH
CULTURE: NO GROWTH

## 2017-08-25 LAB — CBC
HEMATOCRIT: 38.5 % — AB (ref 39.0–52.0)
HEMOGLOBIN: 13.7 g/dL (ref 13.0–17.0)
MCH: 32.9 pg (ref 26.0–34.0)
MCHC: 35.6 g/dL (ref 30.0–36.0)
MCV: 92.5 fL (ref 78.0–100.0)
Platelets: 196 10*3/uL (ref 150–400)
RBC: 4.16 MIL/uL — AB (ref 4.22–5.81)
RDW: 11.9 % (ref 11.5–15.5)
WBC: 8.4 10*3/uL (ref 4.0–10.5)

## 2017-08-25 LAB — MAGNESIUM: MAGNESIUM: 1.6 mg/dL — AB (ref 1.7–2.4)

## 2017-08-25 MED ORDER — MAGNESIUM SULFATE 2 GM/50ML IV SOLN
2.0000 g | Freq: Once | INTRAVENOUS | Status: AC
  Administered 2017-08-25: 2 g via INTRAVENOUS
  Filled 2017-08-25: qty 50

## 2017-08-25 MED ORDER — POTASSIUM CHLORIDE CRYS ER 20 MEQ PO TBCR
40.0000 meq | EXTENDED_RELEASE_TABLET | Freq: Two times a day (BID) | ORAL | Status: AC
  Administered 2017-08-25 – 2017-08-26 (×4): 40 meq via ORAL
  Filled 2017-08-25 (×4): qty 2

## 2017-08-25 MED ORDER — SIMETHICONE 80 MG PO CHEW
80.0000 mg | CHEWABLE_TABLET | Freq: Four times a day (QID) | ORAL | Status: DC | PRN
  Administered 2017-08-25 – 2017-08-26 (×2): 80 mg via ORAL
  Filled 2017-08-25 (×2): qty 1
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
## 2017-08-25 NOTE — BH Assessment (Addendum)
Pt is cooperative during reassessment. He reports he only slept 4 hrs last night and he was given sleep meds. He reports his appetite is good. Pt denies SI and denies HI. He denies [facility] and no delusions noted. When asked about his "heat flashes", pt says, "I don't feel all that hot." Pt shows marks on his arms where IV have been attempted. Pt then says, "I'm in a depressed state."  Writer then spoke w/ Zerita Boers NP who continues to recommend geropsych placement for pt.   Arnold Long, [place] Therapeutic Triage Specialist

## 2017-08-26 DIAGNOSIS — I10 Essential (primary) hypertension: Secondary | ICD-10-CM

## 2017-08-26 DIAGNOSIS — D122 Benign neoplasm of ascending colon: Secondary | ICD-10-CM | POA: Diagnosis present

## 2017-08-26 DIAGNOSIS — N390 Urinary tract infection, site not specified: Secondary | ICD-10-CM | POA: Diagnosis not present

## 2017-08-26 DIAGNOSIS — R1013 Epigastric pain: Secondary | ICD-10-CM | POA: Diagnosis not present

## 2017-08-26 DIAGNOSIS — E785 Hyperlipidemia, unspecified: Secondary | ICD-10-CM | POA: Diagnosis present

## 2017-08-26 DIAGNOSIS — R109 Unspecified abdominal pain: Secondary | ICD-10-CM | POA: Diagnosis not present

## 2017-08-26 DIAGNOSIS — D124 Benign neoplasm of descending colon: Secondary | ICD-10-CM | POA: Diagnosis present

## 2017-08-26 DIAGNOSIS — L4050 Arthropathic psoriasis, unspecified: Secondary | ICD-10-CM | POA: Diagnosis present

## 2017-08-26 DIAGNOSIS — G4700 Insomnia, unspecified: Secondary | ICD-10-CM | POA: Diagnosis present

## 2017-08-26 DIAGNOSIS — R1030 Lower abdominal pain, unspecified: Secondary | ICD-10-CM | POA: Diagnosis present

## 2017-08-26 DIAGNOSIS — K6289 Other specified diseases of anus and rectum: Secondary | ICD-10-CM | POA: Diagnosis present

## 2017-08-26 DIAGNOSIS — Z87440 Personal history of urinary (tract) infections: Secondary | ICD-10-CM | POA: Diagnosis not present

## 2017-08-26 DIAGNOSIS — M064 Inflammatory polyarthropathy: Secondary | ICD-10-CM | POA: Diagnosis present

## 2017-08-26 DIAGNOSIS — F17210 Nicotine dependence, cigarettes, uncomplicated: Secondary | ICD-10-CM | POA: Diagnosis present

## 2017-08-26 DIAGNOSIS — F332 Major depressive disorder, recurrent severe without psychotic features: Secondary | ICD-10-CM | POA: Diagnosis present

## 2017-08-26 DIAGNOSIS — F1020 Alcohol dependence, uncomplicated: Secondary | ICD-10-CM | POA: Diagnosis present

## 2017-08-26 DIAGNOSIS — K5730 Diverticulosis of large intestine without perforation or abscess without bleeding: Secondary | ICD-10-CM | POA: Diagnosis present

## 2017-08-26 DIAGNOSIS — E860 Dehydration: Secondary | ICD-10-CM | POA: Diagnosis present

## 2017-08-26 DIAGNOSIS — R45851 Suicidal ideations: Secondary | ICD-10-CM | POA: Diagnosis present

## 2017-08-26 DIAGNOSIS — E876 Hypokalemia: Secondary | ICD-10-CM | POA: Diagnosis present

## 2017-08-26 DIAGNOSIS — R9431 Abnormal electrocardiogram [ECG] [EKG]: Secondary | ICD-10-CM | POA: Diagnosis not present

## 2017-08-26 DIAGNOSIS — R933 Abnormal findings on diagnostic imaging of other parts of digestive tract: Secondary | ICD-10-CM | POA: Diagnosis not present

## 2017-08-26 DIAGNOSIS — K648 Other hemorrhoids: Secondary | ICD-10-CM | POA: Diagnosis present

## 2017-08-26 DIAGNOSIS — R1010 Upper abdominal pain, unspecified: Secondary | ICD-10-CM | POA: Diagnosis not present

## 2017-08-26 DIAGNOSIS — N179 Acute kidney failure, unspecified: Secondary | ICD-10-CM | POA: Diagnosis present

## 2017-08-26 DIAGNOSIS — E871 Hypo-osmolality and hyponatremia: Secondary | ICD-10-CM | POA: Diagnosis present

## 2017-08-26 DIAGNOSIS — K5900 Constipation, unspecified: Secondary | ICD-10-CM | POA: Diagnosis present

## 2017-08-26 LAB — CBC
HCT: 37.9 % — ABNORMAL LOW (ref 39.0–52.0)
HEMOGLOBIN: 13.7 g/dL (ref 13.0–17.0)
MCH: 33.3 pg (ref 26.0–34.0)
MCHC: 36.1 g/dL — ABNORMAL HIGH (ref 30.0–36.0)
MCV: 92.2 fL (ref 78.0–100.0)
Platelets: 185 10*3/uL (ref 150–400)
RBC: 4.11 MIL/uL — AB (ref 4.22–5.81)
RDW: 11.9 % (ref 11.5–15.5)
WBC: 7.7 10*3/uL (ref 4.0–10.5)

## 2017-08-26 LAB — BASIC METABOLIC PANEL
ANION GAP: 7 (ref 5–15)
BUN: 8 mg/dL (ref 6–20)
CO2: 24 mmol/L (ref 22–32)
Calcium: 8.3 mg/dL — ABNORMAL LOW (ref 8.9–10.3)
Chloride: 99 mmol/L — ABNORMAL LOW (ref 101–111)
Creatinine, Ser: 0.73 mg/dL (ref 0.61–1.24)
GFR calc Af Amer: >60 mL/min (ref >60)
GLUCOSE: 87 mg/dL (ref 65–99)
POTASSIUM: 3.6 mmol/L (ref 3.5–5.1)
Sodium: 130 mmol/L — ABNORMAL LOW (ref 135–145)

## 2017-08-26 LAB — MAGNESIUM: MAGNESIUM: 1.7 mg/dL (ref 1.7–2.4)

## 2017-08-26 LAB — SEDIMENTATION RATE: Sed Rate: 20 mm/hr — ABNORMAL HIGH (ref 0–16)

## 2017-08-26 MED ORDER — METRONIDAZOLE IN NACL 5-0.79 MG/ML-% IV SOLN
500.0000 mg | Freq: Three times a day (TID) | INTRAVENOUS | Status: DC
  Administered 2017-08-26 – 2017-08-27 (×2): 500 mg via INTRAVENOUS
  Filled 2017-08-26 (×2): qty 100

## 2017-08-26 MED ORDER — DEXTROSE 5 % IV SOLN
1.0000 g | INTRAVENOUS | Status: DC
  Administered 2017-08-27: 1 g via INTRAVENOUS
  Filled 2017-08-26 (×3): qty 10

## 2017-08-26 MED ORDER — PREDNISONE 20 MG PO TABS
50.0000 mg | ORAL_TABLET | Freq: Every day | ORAL | Status: DC
  Administered 2017-08-27 – 2017-08-29 (×2): 50 mg via ORAL
  Filled 2017-08-26 (×3): qty 2

## 2017-08-26 MED ORDER — PANTOPRAZOLE SODIUM 40 MG PO TBEC
40.0000 mg | DELAYED_RELEASE_TABLET | Freq: Every day | ORAL | Status: DC
  Administered 2017-08-27: 40 mg via ORAL
  Filled 2017-08-26: qty 1

## 2017-08-26 MED ORDER — HYDROCORTISONE ACETATE 25 MG RE SUPP
25.0000 mg | Freq: Two times a day (BID) | RECTAL | Status: DC
  Administered 2017-08-27: 25 mg via RECTAL
  Filled 2017-08-26 (×2): qty 1

## 2017-08-26 NOTE — Progress Notes (Signed)
Safety sitter remains at bedside.  Pt denies SI or a Plan.  Flat affect noted.  Pt states that he has been depressed recently due to his decreasing health status and that prior to admission, he did "pick up the 22 pistol and looked at it, but put it back."  He denies any further action regarding the pistol.  Also states history of treatment for depression.  States ETOH 4 x's per day.  Will continue to monitor.

## 2017-08-26 NOTE — Progress Notes (Signed)
Disposition changed to AM psych eval per phone call from patient's care coordinator stating that patient told a nurse this morning that he was too weak to pull the trigger last time but will do that this time. Information was contradictory to the telepsych assessment today at 1317 when patient denies SI/HI/VAH.

## 2017-08-26 NOTE — BH Assessment (Signed)
[facility] Assessment Progress Note  Pt reassessed today. Pt shared that he has been sick with infection that is now being treated. Pt denies being suicidal and stated that it was some type of misunderstanding. Pt indicates that he had the gun in his hand, but he never put it up to his head. Pt says "I love God. I don't want to go to hell". Pt shares that, upon d/c, his longtime friend, Edrees Valent, will be staying with him to help take care of him. During the reassessment, [name] and pt's daughter, [name], entered the room. [name] verified that pt was diagnosed with "a severe UTI" and was confused and frustrated. Both [name] and [name] have no concerns with pt being d/c to home, after being medically cleared. Neither feel that pt is a threat to himself or others.  [name] has vowed to take pt's gun out of the home. [name] verified that he will be staying with pt to help take care of him.  Case staffed with Hughie Closs, NP, and pt is recommended to be d/c once medically cleared. He is now psychiatrically cleared.    Kenna Gilbert. Lovena Le, [place], [place], LPCA Counselor

## 2017-08-26 NOTE — Progress Notes (Signed)
[facility] TEAM 2  Daniel Barnett  KJZ:[id number] DOB: 1946/10/12 DOA: 08/23/2017 PCP: Chevis Pretty, FNP    Brief Narrative:  71 y.o. male with a history of HTN, HLD, freq UTIs, and chronic constipation who presented w/ 6 months of constipation and having trouble urinating.  In the ED he was found to have proctitis.    Subjective: Patient feels that the diarrhea has improved, has not seen any blood in the stool.   He denies chest pain or shortness of breath. 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, check ESR, check acute hepatitis panel Prednisone 50 mg a day for bilateral hand pain likely secondary to inflammatory arthritis Also check anti-CCP antibody  Hyponatremia - Dehydration  Improving w/ IVF - cont volume expansion for now and follow trend   Acute kidney injury GFR < 60 at presentation - most consistent with prerenal azotemia - resolved w/ volume expansion   Hypomagnesemia Replace and follow trend   Hypokalemia  Cont to replace to goal of 4.0   HTN Blood pressure currently well controlled  Suicidal ideation  Reportedly told ER RN he wanted to die - some reports of actually attempting to shoot himself prior to coming to the ED -patient evaluated by psychiatry, patient's daughter endorses that the patient is not suicidal, they are not concerned about patient being discharged home. Psychiatry recommended discharge home when medically stable   DVT prophylaxis: SCDs Code Status: FULL CODE Family Communication: no family present at time of exam  Disposition Plan:   consult GI for sigmoidoscopy tomorrow  Consultants:  Gastroenterology  Procedures: none  Antimicrobials:  Unasyn 9/7 >  Objective: Blood pressure (!) 150/72, pulse (!) 55, temperature 98.2 F (36.8 C), temperature source Oral, resp. rate 18, height '6\' 1"'  (1.854 m), weight 101.6 kg (224 lb), SpO2 100 %.  Intake/Output Summary (Last 24 hours) at 08/26/17 1508 Last data filed at 08/26/17 1242  Gross per 24 hour  Intake          1088.75 ml  Output                0 ml  Net          1088.75 ml   Filed Weights   08/23/17 1907  Weight: 101.6 kg (224 lb)    Examination: General: No acute respiratory distress - alert and oriented  Lungs: CTA B w/o wheeze or crackles  Cardiovascular: RRR w/o M, G, R Abdomen: Nontender, nondistended, soft, bowel sounds positive, no rebound Extremities: No significant C/C/E B LE   CBC:  Recent Labs Lab 08/23/17 2045 08/24/17 0556 08/25/17 0557 08/26/17 0622  WBC 9.7 9.3 8.4 7.7  NEUTROABS 7.0  --   --   --   HGB 17.1* 14.9 13.7 13.7  HCT 46.3 40.8 38.5* 37.9*  MCV 91.3 92.3 92.5 92.2  PLT 190 207 196 948   Basic Metabolic Panel:  Recent Labs Lab 08/23/17 2045 08/24/17 0556 08/25/17 0557 08/26/17 0622  NA 126* 127* 132* 130*  K 4.4 3.5 3.5 3.6  CL 90* 93* 99* 99*  CO2 '22 23 26 24  ' GLUCOSE 89 74 84 87  BUN 33* 24* 14 8  CREATININE 1.24 0.96  0.71 0.73  CALCIUM 9.5 8.6* 8.5* 8.3*  MG  --   --  1.6* 1.7  PHOS  --   --  2.4*  --    GFR: Estimated Creatinine Clearance: 107.7 mL/min (by C-G formula based on SCr of 0.73 mg/dL).  Liver Function Tests:  Recent Labs Lab 08/23/17 2045 08/25/17 0557  AST 23 47*  ALT 21 17  ALKPHOS 65 53  BILITOT 1.6* 1.0  PROT 7.5 6.0*  ALBUMIN 4.1 3.2*    Recent Labs Lab 08/23/17 2045  LIPASE 41   Cardiac Enzymes:  Recent Labs Lab 08/23/17 2045  TROPONINI 0.04*    Scheduled Meds: . ALPRAZolam  0.25 mg Oral QHS  . metoprolol succinate  25 mg Oral Daily  . pantoprazole  40 mg Oral Daily  . potassium chloride  40 mEq Oral BID  .  predniSONE  50 mg Oral Q breakfast     LOS: 0 days      On-Call/Text Page:      [email or web address]      password [email or web address]  If 7PM-7AM, please contact night-coverage [email or web address] Password [email or web address] 08/26/2017, 3:08 PM

## 2017-08-26 NOTE — Clinical Social Work Note (Signed)
LCSW will follow up with patient to provide behavioral health resources. Patient has been cleared by psychiatry.     Salma Walrond, Clydene Pugh, LCSW

## 2017-08-26 NOTE — Care Management Note (Signed)
Case Management Note  Patient Details  Name: DEMETRIS CAPELL MRN: [id number] Date of Birth: 1946/10/24  Subjective/Objective: Adm with proctitis, hyponatremia, AKI. From home, psych/TTS consulted due to reports of SI.             Action/Plan: CSW working on geropsych placement. CM will sign off.    Expected Discharge Date:  08/27/17               [facility]  [facility]  In-House Referral:  Clinical Social Work  Discharge planning Services  CM Consult  Post Acute Care Choice:  NA Choice offered to:  NA  DME Arranged:    DME Agency:     HH Arranged:    [place] Agency:     Status of Service:  Completed, signed off  If discussed at [employer], dates discussed:    Additional Comments:  Shawnta Zimbelman, Chauncey Reading, RN 08/26/2017, 7:46 AM

## 2017-08-26 NOTE — Progress Notes (Signed)
Initial visit offering support. He shared his health had changed since earlier this year--UTI and constipation, recurring. His daughter had been here but was retuning home. He shared he had a good friend that helped him and when I asked if there were others, he said not really.  He did share he had no plans to harm himself. I shared I would return tomorrow as he stated he was tired and wanted to rest.

## 2017-08-26 NOTE — Clinical Social Work Note (Signed)
Patient's daughter, Richardean Sale, advised that she would like for patient to be placed in geripsych near [place], [place], because she needs wants patient near her if possible. LCSW communicated this to TTS and provided them with Ms. Abbott's contact information.      Tawny Asal, Clydene Pugh, LCSW

## 2017-08-27 ENCOUNTER — Encounter (HOSPITAL_COMMUNITY): Payer: Self-pay | Admitting: *Deleted

## 2017-08-27 DIAGNOSIS — N390 Urinary tract infection, site not specified: Secondary | ICD-10-CM

## 2017-08-27 DIAGNOSIS — R1010 Upper abdominal pain, unspecified: Secondary | ICD-10-CM

## 2017-08-27 DIAGNOSIS — R45851 Suicidal ideations: Secondary | ICD-10-CM

## 2017-08-27 DIAGNOSIS — F17210 Nicotine dependence, cigarettes, uncomplicated: Secondary | ICD-10-CM

## 2017-08-27 DIAGNOSIS — K6289 Other specified diseases of anus and rectum: Principal | ICD-10-CM

## 2017-08-27 LAB — HEPATITIS PANEL, ACUTE
HEP B S AG: NEGATIVE
Hep A IgM: NEGATIVE
Hep B C IgM: NEGATIVE

## 2017-08-27 LAB — COMPREHENSIVE METABOLIC PANEL
ALT: 18 U/L (ref 17–63)
AST: 21 U/L (ref 15–41)
Albumin: 3.1 g/dL — ABNORMAL LOW (ref 3.5–5.0)
Alkaline Phosphatase: 53 U/L (ref 38–126)
Anion gap: 6 (ref 5–15)
BILIRUBIN TOTAL: 0.6 mg/dL (ref 0.3–1.2)
BUN: 8 mg/dL (ref 6–20)
CHLORIDE: 98 mmol/L — AB (ref 101–111)
CO2: 26 mmol/L (ref 22–32)
CREATININE: 0.78 mg/dL (ref 0.61–1.24)
Calcium: 8.5 mg/dL — ABNORMAL LOW (ref 8.9–10.3)
Glucose, Bld: 87 mg/dL (ref 65–99)
Potassium: 3.9 mmol/L (ref 3.5–5.1)
Sodium: 130 mmol/L — ABNORMAL LOW (ref 135–145)
TOTAL PROTEIN: 6.1 g/dL — AB (ref 6.5–8.1)

## 2017-08-27 LAB — CBC
HEMATOCRIT: 37.8 % — AB (ref 39.0–52.0)
Hemoglobin: 13.7 g/dL (ref 13.0–17.0)
MCH: 33.7 pg (ref 26.0–34.0)
MCHC: 36.2 g/dL — ABNORMAL HIGH (ref 30.0–36.0)
MCV: 92.9 fL (ref 78.0–100.0)
PLATELETS: 205 10*3/uL (ref 150–400)
RBC: 4.07 MIL/uL — AB (ref 4.22–5.81)
RDW: 11.9 % (ref 11.5–15.5)
WBC: 8 10*3/uL (ref 4.0–10.5)

## 2017-08-27 LAB — HIV ANTIBODY (ROUTINE TESTING W REFLEX): HIV Screen 4th Generation wRfx: NONREACTIVE

## 2017-08-27 MED ORDER — POLYETHYLENE GLYCOL 3350 17 G PO PACK
17.0000 g | PACK | ORAL | Status: AC
  Administered 2017-08-27 (×6): 17 g via ORAL
  Filled 2017-08-27 (×6): qty 1

## 2017-08-27 MED ORDER — BISACODYL 5 MG PO TBEC
10.0000 mg | DELAYED_RELEASE_TABLET | ORAL | Status: AC
  Administered 2017-08-27 (×2): 10 mg via ORAL
  Filled 2017-08-27 (×2): qty 2

## 2017-08-27 MED ORDER — SODIUM CHLORIDE 0.9 % IV SOLN
INTRAVENOUS | Status: DC
  Administered 2017-08-27: 22:00:00 via INTRAVENOUS

## 2017-08-27 MED ORDER — PANTOPRAZOLE SODIUM 40 MG PO TBEC
40.0000 mg | DELAYED_RELEASE_TABLET | Freq: Two times a day (BID) | ORAL | Status: DC
  Administered 2017-08-27 – 2017-08-29 (×2): 40 mg via ORAL
  Filled 2017-08-27 (×3): qty 1

## 2017-08-27 MED ORDER — ONDANSETRON HCL 4 MG/2ML IJ SOLN: 4.0000 mg | Freq: Four times a day (QID) | INTRAMUSCULAR | Status: DC | PRN

## 2017-08-27 MED ORDER — ONDANSETRON HCL 4 MG/2ML IJ SOLN
4.0000 mg | Freq: Three times a day (TID) | INTRAMUSCULAR | Status: DC
  Administered 2017-08-27 – 2017-08-29 (×5): 4 mg via INTRAVENOUS
  Filled 2017-08-27 (×6): qty 2

## 2017-08-27 MED ORDER — MAGNESIUM OXIDE 400 (241.3 MG) MG PO TABS
400.0000 mg | ORAL_TABLET | Freq: Every day | ORAL | Status: DC
  Administered 2017-08-27 – 2017-08-29 (×2): 400 mg via ORAL
  Filled 2017-08-27 (×3): qty 1

## 2017-08-27 NOTE — Evaluation (Signed)
Physical Therapy Evaluation Patient Details Name: Daniel Barnett MRN: [id number] DOB: [birth year] Today's Date: 08/27/2017   History of Present Illness  71 yo male with onset of suspected suicidal tendencies was admitted and was noted to have AKI and suspected rectal CA.  Has testing ongoing for clearing out the diagnoses, and geripsych consult pending.  PMHx:  HTN, HLD, UTI  Clinical Impression  Pt is up to walk with no AD with nursing but unsafe.  He is much more stable with walker in place, reviewed safety and control of standing balance with symptoms and how to get nursing help to go back to bed.    Follow Up Recommendations Home health PT;Supervision/Assistance - 24 hour    Equipment Recommendations  Rolling walker with 5" wheels    Recommendations for Other Services       Precautions / Restrictions Precautions Precautions: Fall Precaution Comments: pt is dizzy upon standing and has concern for his balance Restrictions Weight Bearing Restrictions: No Other Position/Activity Restrictions: up with asssist only      Mobility  Bed Mobility Overal bed mobility: Modified Independent             General bed mobility comments: able to cme up to sit bedside  Transfers Overall transfer level: Needs assistance Equipment used: Rolling walker (2 wheeled) Transfers: Sit to/from Stand Sit to Stand: Min assist         General transfer comment: min assist to power up and control initial standing balance  Ambulation/Gait Ambulation/Gait assistance: Min guard Ambulation Distance (Feet): 15 Feet Assistive device: Rolling walker (2 wheeled);1 person hand held assist Gait Pattern/deviations: Step-to pattern;Step-through pattern;Decreased stride length;Narrow base of support;Trunk flexed Gait velocity: reduced Gait velocity interpretation: Below normal speed for age/gender General Gait Details: pt was able to assist with maneuvering walker and controlling set up to  sit  Stairs Stairs:  (deferred due to dizziness)          Wheelchair Mobility    Modified Rankin (Stroke Patients Only)       Balance Overall balance assessment: Needs assistance Sitting-balance support: Feet supported;Single extremity supported Sitting balance-Leahy Scale: Good   Postural control: Posterior lean Standing balance support: Bilateral upper extremity supported;During functional activity Standing balance-Leahy Scale: Fair Standing balance comment: less than fair balance with dynamic standing                             Pertinent Vitals/Pain Pain Assessment: Faces Faces Pain Scale: Hurts little more Pain Location: B hands at knuckles Pain Intervention(s): Limited activity within patient's tolerance;Monitored during session;Repositioned    Home Living Family/patient expects to be discharged to:: Private residence Living Arrangements: Alone Available Help at Discharge: Friend(s);Home health;Available 24 hours/day Type of Home: House Home Access: Stairs to enter Entrance Stairs-Rails: Left Entrance Stairs-Number of Steps: 4 Home Layout: One level Home Equipment: None Additional Comments: will require assistance to walk after dc    Prior Function Level of Independence: Needs assistance   Gait / Transfers Assistance Needed: was able to walk with no asssistance  ADL's / Homemaking Assistance Needed: lived alone but not clear on how independent he as        Hand Dominance        Extremity/Trunk Assessment   Upper Extremity Assessment Upper Extremity Assessment:  (has trouble with pwer to grip)    Lower Extremity Assessment Lower Extremity Assessment: Generalized weakness    Cervical / Trunk Assessment Cervical / Trunk  Assessment: Normal  Communication   Communication: No difficulties  Cognition Arousal/Alertness: Awake/alert Behavior During Therapy: Flat affect Overall Cognitive Status: No family/caregiver present to determine  baseline cognitive functioning                                 General Comments: pt is a bit unclear on some history but can follow instructions and use an AD      General Comments      Exercises     Assessment/Plan    PT Assessment Patient needs continued PT services  PT Problem List Decreased strength;Decreased range of motion;Decreased activity tolerance;Decreased balance;Decreased mobility;Decreased coordination;Decreased knowledge of use of DME;Decreased safety awareness;Decreased knowledge of precautions;Obesity;Pain       PT Treatment Interventions DME instruction;Gait training;Stair training;Functional mobility training;Therapeutic activities;Therapeutic exercise;Balance training;Neuromuscular re-education;Patient/family education    PT Goals (Current goals can be found in the Care Plan section)  Acute Rehab PT Goals Patient Stated Goal: to walk and get home again PT Goal Formulation: With patient Time For Goal Achievement: 09/10/17 Potential to Achieve Goals: Good    Frequency Min 3X/week   Barriers to discharge Inaccessible home environment screen stairs    Co-evaluation               AM-PAC PT "6 Clicks" Daily Activity  Outcome Measure Difficulty turning over in bed (including adjusting bedclothes, sheets and blankets)?: A Little Difficulty moving from lying on back to sitting on the side of the bed? : Unable Difficulty sitting down on and standing up from a chair with arms (e.g., wheelchair, bedside commode, etc,.)?: Unable Help needed moving to and from a bed to chair (including a wheelchair)?: A Little Help needed walking in hospital room?: A Little Help needed climbing 3-5 steps with a railing? : A Lot 6 Click Score: 13    End of Session Equipment Utilized During Treatment: Gait belt Activity Tolerance: Patient limited by fatigue;Treatment limited secondary to medical complications (Comment) Patient left: in chair;with call  bell/phone within reach;with chair alarm set;with nursing/sitter in room Nurse Communication: Mobility status PT Visit Diagnosis: Unsteadiness on feet (R26.81);Ataxic gait (R26.0);Difficulty in walking, not elsewhere classified (R26.2);Dizziness and giddiness (R42)    Time: 1191-4782 PT Time Calculation (min) (ACUTE ONLY): 31 min   Charges:   PT Evaluation $PT Eval Moderate Complexity: 1 Mod PT Treatments $Gait Training: 8-22 mins   PT G Codes:   PT G-Codes **NOT FOR INPATIENT CLASS** Functional Assessment Tool Used: AM-PAC 6 Clicks Basic Mobility    Ramond Dial 08/27/2017, 4:56 PM   4:57 PM, 08/27/17 Mee Hives, PT, MS Physical Therapist - [place] [phone] (Office)

## 2017-08-27 NOTE — Progress Notes (Signed)
PT Cancellation Note  Patient Details Name: Daniel Barnett MRN: [id number] DOB: 02-04-1946   Cancelled Treatment:    Reason Eval/Treat Not Completed: Other (comment).  Pt is awaiting a test for his GI issues, and will not be able to stand up until afterward.  Check later to see if he is available.   Ramond Dial 08/27/2017, 12:50 PM  12:51 PM, 08/27/17 Mee Hives, PT, MS Physical Therapist - [place] [phone] (Office)

## 2017-08-27 NOTE — Progress Notes (Addendum)
Daniel Barnett TEAM 2  Daniel Barnett  HBZ:[id number] DOB: Apr 21, 1946 DOA: 08/23/2017 PCP: Chevis Pretty, FNP    Brief Narrative:  71 y.o. male with a history of HTN, HLD, freq UTIs, and chronic constipation who presented w/ 6 months of constipation and having trouble urinating.  In the ED he was found to have proctitis.    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,   ESR 20,   Prednisone 50 mg a day for bilateral hand pain likely secondary to inflammatory arthritis Also check anti-CCP antibody  Hyponatremia - Dehydration  Improving w/ IVF - cont volume expansion for now and follow trend   Acute kidney injury GFR < 60 at presentation - most consistent with prerenal azotemia - resolved w/ volume expansion   Hypomagnesemia Replace and follow trend   Hypokalemia  Cont to replace to goal of 4.0   HTN Blood pressure currently well controlled  Suicidal ideation  Reportedly told ER RN he wanted to die - some reports of actually attempting to shoot himself prior to coming to the ED -patient evaluated by psychiatry 9/10 , patient's daughter endorses that the patient is not suicidal, they are not concerned about patient being discharged home. Psychiatry recommended discharge home when medically stable Requested psychiatry to clarify 9/11, if patient will be safe for discharge to home tomorrow   DVT prophylaxis: SCDs Code Status: FULL CODE Family Communication: no family present at time of exam  Disposition Plan:   consult GI for sigmoidoscopy tomorrow  Consultants: Gastroenterology    Procedures: none  Antimicrobials:  Anti-infectives    Start     Dose/Rate Route Frequency Ordered Stop   08/26/17 1630  cefTRIAXone (ROCEPHIN) 1 g in dextrose 5 % 50 mL IVPB     1 g 100 mL/hr over 30 Minutes Intravenous Every 24 hours 08/26/17 1510     08/26/17 1600  metroNIDAZOLE (FLAGYL) IVPB 500 mg     500 mg 100 mL/hr over 60 Minutes Intravenous Every 8 hours 08/26/17 1510     08/24/17 1400  Ampicillin-Sulbactam (UNASYN) 3 g in sodium chloride 0.9 % 100 mL IVPB  Status:  Discontinued     3 g 200 mL/hr over 30 Minutes Intravenous Every 8 hours 08/24/17 0836 08/26/17 1530   08/24/17 0730  Ampicillin-Sulbactam (UNASYN) 3 g in sodium chloride 0.9 % 100 mL IVPB     3 g 200 mL/hr over 30 Minutes Intravenous  Once 08/24/17 0226 08/24/17 0953   08/24/17 0600  Ampicillin-Sulbactam (UNASYN) 3 g in sodium chloride 0.9 % 100 mL IVPB  Status:  Discontinued     3 g 200 mL/hr over 30 Minutes Intravenous Every 6 hours 08/24/17 0218 08/24/17 0227   08/23/17 2330  Ampicillin-Sulbactam (UNASYN) 3 g in sodium chloride 0.9 % 100 mL IVPB     3 g 200 mL/hr over 30 Minutes Intravenous  Once 08/23/17 2315 08/24/17 0003       Objective: Blood pressure (!) 162/84, pulse 61, temperature (!) 97.5 F (36.4 C), temperature source Oral, resp. rate 18, height '6\' 1"'  (1.854 m), weight 101.6 kg (224 lb), SpO2 100 %.  Intake/Output Summary (Last 24 hours)  at 08/27/17 1133 Last data filed at 08/27/17 0300  Gross per 24 hour  Intake             2845 ml  Output                0 ml  Net             2845 ml   Filed Weights   08/23/17 1907  Weight: 101.6 kg (224 lb)    Examination: General: No acute respiratory distress - alert and oriented  Lungs: CTA B w/o wheeze or crackles  Cardiovascular: RRR w/o M, G, R Abdomen: Nontender, nondistended, soft, bowel sounds positive, no rebound Extremities: No significant C/C/E B LE   CBC:  Recent Labs Lab 08/23/17 2045 08/24/17 0556 08/25/17 0557  08/26/17 0622 08/27/17 0554  WBC 9.7 9.3 8.4 7.7 8.0  NEUTROABS 7.0  --   --   --   --   HGB 17.1* 14.9 13.7 13.7 13.7  HCT 46.3 40.8 38.5* 37.9* 37.8*  MCV 91.3 92.3 92.5 92.2 92.9  PLT 190 207 196 185 009   Basic Metabolic Panel:  Recent Labs Lab 08/23/17 2045 08/24/17 0556 08/25/17 0557 08/26/17 0622 08/27/17 0554  NA 126* 127* 132* 130* 130*  K 4.4 3.5 3.5 3.6 3.9  CL 90* 93* 99* 99* 98*  CO2 '22 23 26 24 26  ' GLUCOSE 89 74 84 87 87  BUN 33* 24* '14 8 8  ' CREATININE 1.24 0.96 0.71 0.73 0.78  CALCIUM 9.5 8.6* 8.5* 8.3* 8.5*  MG  --   --  1.6* 1.7  --   PHOS  --   --  2.4*  --   --    GFR: Estimated Creatinine Clearance: 107.7 mL/min (by C-G formula based on SCr of 0.78 mg/dL).  Liver Function Tests:  Recent Labs Lab 08/23/17 2045 08/25/17 0557 08/27/17 0554  AST 23 47* 21  ALT '21 17 18  ' ALKPHOS 65 53 53  BILITOT 1.6* 1.0 0.6  PROT 7.5 6.0* 6.1*  ALBUMIN 4.1 3.2* 3.1*    Recent Labs Lab 08/23/17 2045  LIPASE 41   Cardiac Enzymes:  Recent Labs Lab 08/23/17 2045  TROPONINI 0.04*    Scheduled Meds: . ALPRAZolam  0.25 mg Oral QHS  . hydrocortisone  25 mg Rectal BID  . metoprolol succinate  25 mg Oral Daily  . pantoprazole  40 mg Oral Daily  . predniSONE  50 mg Oral Q breakfast     LOS: 1 day      On-Call/Text Page:      [email or web address]      password [email or web address]  If 7PM-7AM, please contact night-coverage [email or web address] Password [facility] 08/27/2017, 11:33 AM

## 2017-08-27 NOTE — Progress Notes (Signed)
Per Hughie Closs, NP, the patient does not meet criteria for inpatient treatment. The patient is [facility] CLEARED, and can be discharged once medically cleared.   Patient is PSYCH CLEARED.   Janan [place], RN notified.    Radonna Ricker MSW, [facility] Disposition [phone]

## 2017-08-27 NOTE — Progress Notes (Signed)
Colonoscopy planned for 9/12 with Dr. Gala Romney Will plan with Propofol due to chronic ETOH use, xanax. 2 tap water enemas in the morning.

## 2017-08-27 NOTE — Consult Note (Signed)
Telepsych Consultation   Reason for Consult: SI Referring Physician: EDP Location of Patient: [facility]PT 300 Location of Provider: [facility]  Patient Identification: Daniel Barnett MRN:  [id number] Principal Diagnosis: Proctitis Diagnosis:   Patient Active Problem List   Diagnosis Date Noted  . Abdominal pain [R10.9]   . Hyponatremia [E87.1]   . 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 CN (constipation) [K59.00] 04/18/2015  . Insomnia [G47.00] 04/02/2014  . BMI 30.0-30.9,adult [Z68.30] 04/02/2014  . Essential hypertension, benign [I10] 04/13/2013  . Hyperlipidemia with target LDL less than 100 [E78.5] 04/13/2013    Total Time spent with patient: 30 minutes  Subjective:   Daniel Barnett is a 71 y.o. male patient admitted with Major Depressive Disorder, Recurrent, Severe without Psychotic Features.  HPI: Per the TTS assessment completed by 08/24/17 by [name]: Daniel Barnett is an 71 y.o. male. Pt is cooperative and oriented x 4. He reports since March he often "feels like I have a fever but I don't have a fever." He reports the "heat flash" "takes away your train of thought." He reports that last week he got out his 22. Handgun. At first, pt says he held the gun in his hand but didn't consider shooting himself. Later in assessment, pt reports that he "did think" about suicide when he held the gun last week. He says that he was tired of his heat flashes and constipation. Pt reports that he would not kill himself. Pt says, "I ain't got the guts." When asked about his concentration, pt says, "It takes effort on account of this condition." Pt says he will be confused for a few days when he has heat flashes.  He endorses insomnia and reports he has barely slept in 3 days. When asked re: appetite, pt says it is poor due to "the heat spikes and thought train getting weaker." Pt able to recall 2 of 3 words given during assessment. He reports depressed mood. Pt endorses fatigue,  isolating and loss of interest in usual pleasures. Pt reports his dad attempted suicide twice, once by overdose and once by drinking carpet cleaner. He reports he was admitted once to [facility] in 1990 when he heard voices during the time of a bad divorce. Pt says he no longer fishes or mows b/c of the heat flashes. Pt denies homicidal thoughts or physical aggression. Pt denies having any legal problems at this time. Pt denies any current or past substance abuse problems. Pt does not appear to be intoxicated or in withdrawal at this time. He reports he daily drinks two 12 oz beers in the am and two more 12 oz beers at 2 pm. Pt reports one DUI 15 yrs ago. Pt denies hallucinations. Pt does not appear to be responding to internal stimuli and exhibits no delusional thought. Pt's reality testing appears to be intact.  Per TTS reassessment on 08/26/17 by [name]: Pt reassessed today. Pt shared that he has been sick with infection that is now being treated. Pt denies being suicidal and stated that it was some type of misunderstanding. Pt indicates that he had the gun in his hand, but he never put it up to his head. Pt says "I love God. I don't want to go to hell". Pt shares that, upon d/c, his longtime friend, Daniel Barnett, will be staying with him to help take care of him. During the reassessment, [name] and pt's daughter, Daniel Barnett, entered the room. [name] verified that pt was diagnosed with "a severe  UTI" and was confused and frustrated. Both [name] and [name] have no concerns with pt being d/c to home, after being medically cleared. Neither feel that pt is a threat to himself or others.  [name] has vowed to take pt's gun out of the home. [name] verified that he will be staying with pt to help take care of him.   On Exam: Patient was seen via tele-psych, chart reviewed with treatment team. Patient in bed, awake, alert and oriented x4. Patient reiterated the reason for this hospital admission as documented  above. Patient stated, "I'm not doing great today, I'm in a lot of pain". Patient denies suicide ideation, and stated that he loves his life and would never do anything to hurt himself. He denies that he held a gun to his head. He denies that he made a statement as reported yesterday (per the report, patient told a nurse that he was too weak to pull the trigger last time but will do so this time). Patient stated that his only problem is his pain in his lower abdominal area. He stated that the doctors haven't figured what's going on with him but they're are doing their best to control the pain. Patient stated that whenever he goes home, his friend will be there with him. He also stated that his daughter will be involved in his care and that his daughter has confiscated his gun. Patient denies any HI/VAH and contracts for safety upon discharge.  Past Psychiatric History: See H&P  Risk to Self: Suicidal Ideation: No Suicidal Intent: No Is patient at risk for suicide?: Yes Suicidal Plan?: No Access to Means: Yes Specify Access to Suicidal Means: has .22 and shotgun What has been your use of drugs/alcohol within the last 12 months?: daily alcohol use How many times?: 0 Other Self Harm Risks: none Intentional Self Injurious Behavior: None Risk to Others: Homicidal Ideation: No Thoughts of Harm to Others: No Current Homicidal Intent: No Current Homicidal Plan: No Access to Homicidal Means: No Identified Victim: none History of harm to others?: No Assessment of Violence: None Noted Violent Behavior Description: pt denies hx violence Does patient have access to weapons?: Yes (Comment) Criminal Charges Pending?: No Does patient have a court date: No Prior Inpatient Therapy: Prior Inpatient Therapy: Yes Prior Therapy Dates: 1990 Prior Therapy Facilty/Provider(s): [facility] Reason for Treatment: hearing voices after divorce Prior Outpatient Therapy: Prior Outpatient Therapy: No Does patient have an  ACCT team?: No Does patient have Intensive In-House Services?  : No Does patient have [facility] services? : No Does patient have P4CC services?: No  Past Medical History:  Past Medical History:  Diagnosis Date  . Anxiety   . Gout   . Hyperlipidemia   . Hypertension   . Psoriasis     Past Surgical History:  Procedure Laterality Date  . TONSILLECTOMY    . VASECTOMY     Family History:  Family History  Problem Relation Age of Onset  . Colon cancer Neg Hx   . Colon polyps Neg Hx    Family Psychiatric  History: Unknown  Social History:  History  Alcohol Use  . Yes    Comment: Four 12 ounces of beer a day     History  Drug Use No    Social History   Social History  . Marital status: Divorced    Spouse name: N/A  . Number of children: N/A  . Years of education: N/A   Social History Main Topics  . Smoking  status: Current Every Day Smoker    Types: Cigarettes  . Smokeless tobacco: Never Used     Comment: 1/2 to 1 pack a day   . Alcohol use Yes     Comment: Four 12 ounces of beer a day  . Drug use: No  . Sexual activity: Not Asked   Other Topics Concern  . None   Social History Narrative  . None   Additional Social History:    Allergies:   Allergies  Allergen Reactions  . Ciprofloxacin     Labs:  Results for orders placed or performed during the hospital encounter of 08/23/17 (from the past 48 hour(s))  CBC     Status: Abnormal   Collection Time: 08/26/17  6:22 AM  Result Value Ref Range   WBC 7.7 4.0 - 10.5 K/uL   RBC 4.11 (L) 4.22 - 5.81 MIL/uL   Hemoglobin 13.7 13.0 - 17.0 g/dL   HCT 37.9 (L) 39.0 - 52.0 %   MCV 92.2 78.0 - 100.0 fL   MCH 33.3 26.0 - 34.0 pg   MCHC 36.1 (H) 30.0 - 36.0 g/dL   RDW 11.9 11.5 - 15.5 %   Platelets 185 150 - 400 K/uL  Magnesium     Status: None   Collection Time: 08/26/17  6:22 AM  Result Value Ref Range   Magnesium 1.7 1.7 - 2.4 mg/dL  Basic metabolic panel     Status: Abnormal   Collection Time: 08/26/17   6:22 AM  Result Value Ref Range   Sodium 130 (L) 135 - 145 mmol/L   Potassium 3.6 3.5 - 5.1 mmol/L   Chloride 99 (L) 101 - 111 mmol/L   CO2 24 22 - 32 mmol/L   Glucose, Bld 87 65 - 99 mg/dL   BUN 8 6 - 20 mg/dL   Creatinine, Ser 0.73 0.61 - 1.24 mg/dL   Calcium 8.3 (L) 8.9 - 10.3 mg/dL   GFR calc non Af Amer >60 >60 mL/min   GFR calc Af Amer >60 >60 mL/min    Comment: (NOTE) The eGFR has been calculated using the CKD EPI equation. This calculation has not been validated in all clinical situations. eGFR's persistently <60 mL/min signify possible Chronic Kidney Disease.    Anion gap 7 5 - 15  Sedimentation rate     Status: Abnormal   Collection Time: 08/26/17  3:33 PM  Result Value Ref Range   Sed Rate 20 (H) 0 - 16 mm/hr  HIV antibody     Status: None   Collection Time: 08/26/17  3:33 PM  Result Value Ref Range   HIV Screen 4th Generation wRfx Non Reactive Non Reactive    Comment: (NOTE) Performed At: [facility] [address] [place], [place] [id number] Lindon Romp MD KW:[id number]   Hepatitis panel, acute     Status: None   Collection Time: 08/26/17  3:33 PM  Result Value Ref Range   Hepatitis B Surface Ag Negative Negative   HCV Ab <0.1 0.0 - 0.9 s/co ratio    Comment: (NOTE)                                  Negative:     < 0.8                             Indeterminate: 0.8 - 0.9  Positive:     > 0.9 The CDC recommends that a positive HCV antibody result be followed up with a HCV Nucleic Acid Amplification test (297989). Performed At: [facility] [place], [place] [id number] Lindon Romp MD [id number]    Hep A IgM Negative Negative   Hep B C IgM Negative Negative  CBC     Status: Abnormal   Collection Time: 08/27/17  5:54 AM  Result Value Ref Range   WBC 8.0 4.0 - 10.5 K/uL   RBC 4.07 (L) 4.22 - 5.81 MIL/uL   Hemoglobin 13.7 13.0 - 17.0 g/dL   HCT 37.8 (L) 39.0 - 52.0 %   MCV 92.9  78.0 - 100.0 fL   MCH 33.7 26.0 - 34.0 pg   MCHC 36.2 (H) 30.0 - 36.0 g/dL   RDW 11.9 11.5 - 15.5 %   Platelets 205 150 - 400 K/uL  Comprehensive metabolic panel     Status: Abnormal   Collection Time: 08/27/17  5:54 AM  Result Value Ref Range   Sodium 130 (L) 135 - 145 mmol/L   Potassium 3.9 3.5 - 5.1 mmol/L   Chloride 98 (L) 101 - 111 mmol/L   CO2 26 22 - 32 mmol/L   Glucose, Bld 87 65 - 99 mg/dL   BUN 8 6 - 20 mg/dL   Creatinine, Ser 0.78 0.61 - 1.24 mg/dL   Calcium 8.5 (L) 8.9 - 10.3 mg/dL   Total Protein 6.1 (L) 6.5 - 8.1 g/dL   Albumin 3.1 (L) 3.5 - 5.0 g/dL   AST 21 15 - 41 U/L   ALT 18 17 - 63 U/L   Alkaline Phosphatase 53 38 - 126 U/L   Total Bilirubin 0.6 0.3 - 1.2 mg/dL   GFR calc non Af Amer >60 >60 mL/min   GFR calc Af Amer >60 >60 mL/min    Comment: (NOTE) The eGFR has been calculated using the CKD EPI equation. This calculation has not been validated in all clinical situations. eGFR's persistently <60 mL/min signify possible Chronic Kidney Disease.    Anion gap 6 5 - 15    Medications:  Current Facility-Administered Medications  Medication Dose Route Frequency Provider Last Rate Last Dose  . ALPRAZolam Duanne Moron) tablet 0.25 mg  0.25 mg Oral QHS Cherene Altes, MD   0.25 mg at 08/26/17 2236  . cefTRIAXone (ROCEPHIN) 1 g in dextrose 5 % 50 mL IVPB  1 g Intravenous Q24H Abrol, Nayana, MD      . hydrocortisone (ANUSOL-HC) suppository 25 mg  25 mg Rectal BID Reyne Dumas, MD   25 mg at 08/27/17 1206  . magnesium oxide (MAG-OX) tablet 400 mg  400 mg Oral Daily Reyne Dumas, MD   400 mg at 08/27/17 1205  . metoprolol succinate (TOPROL-XL) 24 hr tablet 25 mg  25 mg Oral Daily Cherene Altes, MD   25 mg at 08/27/17 1206  . metroNIDAZOLE (FLAGYL) IVPB 500 mg  500 mg Intravenous Q8H Reyne Dumas, MD   Stopped at 08/27/17 1004  . ondansetron (ZOFRAN) injection 4 mg  4 mg Intravenous Q6H PRN Reyne Dumas, MD      . pantoprazole (PROTONIX) EC tablet 40 mg  40 mg  Oral Daily Reyne Dumas, MD   40 mg at 08/27/17 1206  . predniSONE (DELTASONE) tablet 50 mg  50 mg Oral Q breakfast Reyne Dumas, MD   50 mg at 08/27/17 0905  . simethicone (MYLICON) chewable tablet 80 mg  80 mg Oral  QID PRN Cherene Altes, MD   80 mg at 08/26/17 1038  . zolpidem (AMBIEN) tablet 5 mg  5 mg Oral QHS PRN Opyd, Ilene Qua, MD   5 mg at 08/26/17 2239    Musculoskeletal: UTA via camera  Psychiatric Specialty Exam: Physical Exam  Nursing note and vitals reviewed.   Review of Systems  Psychiatric/Behavioral: Positive for depression. Negative for hallucinations, memory loss, substance abuse and suicidal ideas. The patient is not nervous/anxious and does not have insomnia.   All other systems reviewed and are negative.   Blood pressure (!) 162/84, pulse 61, temperature (!) 97.5 F (36.4 C), temperature source Oral, resp. rate 18, height _0  (1.854 m), weight 101.6 kg (224 lb), SpO2 100 %.Body mass index is 29.55 kg/m.  General Appearance: on hospital scrub  Eye Contact:  Good  Speech:  Clear and Coherent and Normal Rate  Volume:  Normal  Mood:  Anxious and Euthymic  Affect:  Appropriate and Congruent  Thought Process:  Coherent and Goal Directed  Orientation:  Full (Time, Place, and Person)  Thought Content:  WDL and Logical  Suicidal Thoughts:  No  Homicidal Thoughts:  No  Memory:  Immediate;   Good Recent;   Good Remote;   Fair  Judgement:  Good  Insight:  Good  Psychomotor Activity:  Normal  Concentration:  Concentration: Good and Attention Span: Good  Recall:  Good  Fund of Knowledge:  Good  Language:  Good  Akathisia:  Negative  Handed:  Right  AIMS (if indicated):     Assets:  Communication Skills Desire for Improvement Financial Resources/Insurance Housing Intimacy Leisure Time Physical Health Social Support  ADL's:  Intact  Cognition:  WNL  Sleep:       Patient's case discussed with Dr. Dwyane Dee with the following  recommendations:  Treatment Plan Summary: Plan to discharge patient psychiatrically   Patient denies any SI/HI/VAH and contracts for safety.  Disposition: No evidence of imminent risk to self or others at present.   Patient does not meet criteria for psychiatric inpatient admission. Supportive therapy provided about ongoing stressors. Refer to IOP. Discussed crisis plan, support from social network, calling 911, coming to the Emergency Department, and calling Suicide Hotline.  This service was provided via telemedicine using a 2-way, interactive audio and video technology.  Names of all persons participating in this telemedicine service and their role in this encounter. Name: Ryland Tungate. Nunes Role: Patient  Name: Justina A. Okonkwo  Role: NP           Vicenta Aly, NP 08/27/2017 1:04 PM

## 2017-08-27 NOTE — Consult Note (Signed)
Referring Provider: Dr. Allyson Sabal  Primary Care Physician:  Chevis Pretty, FNP Primary Gastroenterologist:  Dr. Oneida Alar   Date of Admission:  08/24/17 Date of Consultation: 08/27/17 Reason for Consultation:  Proctitis   HPI:  Daniel Barnett is a 71 y.o. year old male who presented with six months of constipation and difficulty urinating. Friday morning states he woke up with diarrhea and "everything emptied on me". No rectal bleeding.  An hour later had recurrent diarrhea several times. Diarrhea resolved. Notes vague abdominal pain at level of umbilicus, intermittent, starting last Friday. States he went to [facility] last Monday and was prescribed abx for UTI. CT abd/pelvis completed on presentation to ED with evidence of bladder wall thickening consistent with history of UTI, rectal wall thickening with some asymmetry and peripheral stranding likely representing proctitis but unable to exclude rectal cancer. Further findings as noted below. History complicated by suicidal ideation, which is being addressed by psych. He currently denies any suicidal ideation to me.   States he has felt clammy, disoriented, starting back in March 2018. Worsened. Gradual weight loss noted. Over past few years has lost down from the 270s to 220. Appetite has been good. States baseline bowel habits are constipation, using Miralax prn. Denies using Miralax prior to presentation with diarrhea. No prior colonoscopy. Has difficulty with large "horse pills", otherwise no dysphagia. No history of reflux.   Past Medical History:  Diagnosis Date  . Anxiety   . Gout   . Hyperlipidemia   . Hypertension   . Psoriasis     Past Surgical History:  Procedure Laterality Date  . TONSILLECTOMY    . VASECTOMY      Prior to Admission medications   Medication Sig Start Date End Date Taking? Authorizing Provider  allopurinol (ZYLOPRIM) 100 MG tablet Take 1 tablet (100 mg total) by mouth daily. 12/25/16  Yes Hassell Done,  Mary-Margaret, FNP  ALPRAZolam Duanne Moron) 0.25 MG tablet TAKE 1 TABLET AT BEDTIME  FOR  ANXIETY 06/17/17  Yes Hassell Done, Mary-Margaret, FNP  cephALEXin (KEFLEX) 500 MG capsule Take 500 mg by mouth 4 (four) times daily.   Yes [provider]  lisinopril-hydrochlorothiazide (PRINZIDE,ZESTORETIC) 20-12.5 MG tablet TAKE 2 TABLETS EVERY DAY 05/22/17  Yes Chipper Herb, MD  metoprolol succinate (TOPROL-XL) 50 MG 24 hr tablet TAKE 1 TABLET DAILY WITH OR IMMEDIATELY FOLLOWING A MEAL 05/22/17  Yes Chipper Herb, MD  nitrofurantoin, macrocrystal-monohydrate, (MACROBID) 100 MG capsule Take 1 capsule by mouth 2 (two) times daily. 08/21/17  Yes [provider]  pravastatin (PRAVACHOL) 20 MG tablet TAKE 1 TABLET EVERY DAY 05/22/17  Yes Chipper Herb, MD    Current Facility-Administered Medications  Medication Dose Route Frequency Provider Last Rate Last Dose  . ALPRAZolam Duanne Moron) tablet 0.25 mg  0.25 mg Oral QHS Cherene Altes, MD   0.25 mg at 08/26/17 2236  . cefTRIAXone (ROCEPHIN) 1 g in dextrose 5 % 50 mL IVPB  1 g Intravenous Q24H Abrol, Nayana, MD      . hydrocortisone (ANUSOL-HC) suppository 25 mg  25 mg Rectal BID Reyne Dumas, MD   25 mg at 08/27/17 1206  . magnesium oxide (MAG-OX) tablet 400 mg  400 mg Oral Daily Reyne Dumas, MD   400 mg at 08/27/17 1205  . metoprolol succinate (TOPROL-XL) 24 hr tablet 25 mg  25 mg Oral Daily Cherene Altes, MD   25 mg at 08/27/17 1206  . metroNIDAZOLE (FLAGYL) IVPB 500 mg  500 mg Intravenous Q8H Reyne Dumas, MD  Stopped at 08/27/17 1004  . ondansetron (ZOFRAN) injection 4 mg  4 mg Intravenous Q6H PRN Reyne Dumas, MD      . pantoprazole (PROTONIX) EC tablet 40 mg  40 mg Oral Daily Reyne Dumas, MD   40 mg at 08/27/17 1206  . predniSONE (DELTASONE) tablet 50 mg  50 mg Oral Q breakfast Reyne Dumas, MD   50 mg at 08/27/17 0905  . simethicone (MYLICON) chewable tablet 80 mg  80 mg Oral QID PRN Cherene Altes, MD   80 mg at 08/26/17 1038  .  zolpidem (AMBIEN) tablet 5 mg  5 mg Oral QHS PRN Opyd, Ilene Qua, MD   5 mg at 08/26/17 2239    Allergies as of 08/23/2017 - Review Complete 08/23/2017  Allergen Reaction Noted  . Ciprofloxacin  04/13/2013    Family History  Problem Relation Age of Onset  . Colon cancer Neg Hx   . Colon polyps Neg Hx     Social History   Social History  . Marital status: Divorced    Spouse name: N/A  . Number of children: N/A  . Years of education: N/A   Occupational History  . Not on file.   Social History Main Topics  . Smoking status: Current Every Day Smoker    Types: Cigarettes  . Smokeless tobacco: Never Used     Comment: 1/2 to 1 pack a day   . Alcohol use Yes     Comment: Four 12 ounces of beer a day  . Drug use: No  . Sexual activity: Not on file   Other Topics Concern  . Not on file   Social History Narrative  . No narrative on file    Review of Systems: Gen: see HPI  CV: Denies chest pain, heart palpitations, syncope, edema  Resp: Denies shortness of breath with rest, cough, wheezing GI: see HPI  GU : see HPI  MS: Denies joint pain,swelling, cramping Derm: Denies rash, itching, dry skin Psych: see HPI  Heme: Denies bruising, bleeding, and enlarged lymph nodes.  Physical Exam: Vital signs in last 24 hours: Temp:  [97.5 F (36.4 C)-98.2 F (36.8 C)] 97.5 F (36.4 C) (09/11 0528) Pulse Rate:  [55-61] 61 (09/11 0528) Resp:  [18] 18 (09/11 0528) BP: (146-162)/(72-84) 162/84 (09/11 0528) SpO2:  [99 %-100 %] 100 % (09/11 0528) Last BM Date: 08/26/17 General:   Alert,  Well-developed, well-nourished, pleasant and cooperative in NAD Head:  Normocephalic and atraumatic. Eyes:  Sclera clear, no icterus.   Conjunctiva pink. Ears:  Normal auditory acuity. Nose:  No deformity, discharge,  or lesions. Mouth:  No deformity or lesions Lungs:  Clear throughout to auscultation.   Heart:  S1 S2 present without murmurs  Abdomen:  Soft, mild discomfort with palpation  periumbilically and nondistended. No masses, hepatosplenomegaly or hernias noted. Normal bowel sounds, without guarding, and without rebound.   Rectal:  Deferred  Msk:  Symmetrical without gross deformities. Normal posture. Extremities:  Without clubbing or edema. Neurologic:  Alert and  oriented x4;  grossly normal neurologically. Psych:  Alert and cooperative. Normal mood and affect.  Intake/Output from previous day: 09/10 0701 - 09/11 0700 In: 3325 [P.O.:600; I.V.:2625; IV Piggyback:100] Out: -  Intake/Output this shift: Total I/O In: 100 [IV Piggyback:100] Out: -   Lab Results:  Recent Labs  08/25/17 0557 08/26/17 0622 08/27/17 0554  WBC 8.4 7.7 8.0  HGB 13.7 13.7 13.7  HCT 38.5* 37.9* 37.8*  PLT 196 185 205  BMET  Recent Labs  08/25/17 0557 08/26/17 0622 08/27/17 0554  NA 132* 130* 130*  K 3.5 3.6 3.9  CL 99* 99* 98*  CO2 26 24 26   GLUCOSE 84 87 87  BUN 14 8 8   CREATININE 0.71 0.73 0.78  CALCIUM 8.5* 8.3* 8.5*   LFT  Recent Labs  08/25/17 0557 08/27/17 0554  PROT 6.0* 6.1*  ALBUMIN 3.2* 3.1*  AST 47* 21  ALT 17 18  ALKPHOS 53 53  BILITOT 1.0 0.6   Hepatitis Panel  Recent Labs  08/26/17 1533  HEPBSAG Negative  HCVAB <0.1  HEPAIGM Negative  HEPBIGM Negative   CT abd/pelvis with contrast 08/23/17:  IMPRESSION: 1. Bladder wall thickening consistent with history of urinary tract infection. Bladder diverticulum. 2. Rectal wall thickening with some asymmetry and peripheral stranding. This likely represents proctitis but rectal cancer is not excluded and follow-up is recommended after resolution of acute process. 3. Prominent aortic atherosclerosis. 4. 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  Impression: 71 year old male presenting with UTI, evidence of rectal wall thickening with some symmetry and peripheral stranding on CT, unable to exclude rectal cancer. Episode of diarrhea after  bout with constipation prior to admission, and he denies taking any laxatives prior to this. No overt GI bleeding, and diarrhea has resolved. Empirically on antibiotics. Endorses weight loss unintentionally, which is concerning. No concerning upper GI symptoms noted. Needs at minimum flex sig for further diagnostic evaluation. No prior colonoscopy. Will need evaluation this admission, tentatively 9/12.   Plan: NPO after midnight Flex sig vs colonoscopy in near future Will continue to follow with you  Annitta Needs, PhD, ANP-BC [facility]     LOS: 1 day    08/27/2017, 12:22 PM

## 2017-08-27 NOTE — Care Management Important Message (Signed)
Important Message  Patient Details  Name: Daniel Barnett MRN: [id number] Date of Birth: 03-03-1946   Medicare Important Message Given:  Yes    Clenton Esper, Chauncey Reading, RN 08/27/2017, 8:52 AM

## 2017-08-28 ENCOUNTER — Inpatient Hospital Stay (HOSPITAL_COMMUNITY): Payer: Medicare HMO | Admitting: Anesthesiology

## 2017-08-28 ENCOUNTER — Encounter (HOSPITAL_COMMUNITY)
Admission: EM | Disposition: A | Discharge status: Home Health | Payer: Self-pay | Source: Home / Self Care | Attending: Internal Medicine

## 2017-08-28 ENCOUNTER — Encounter (HOSPITAL_COMMUNITY): Payer: Self-pay

## 2017-08-28 DIAGNOSIS — R933 Abnormal findings on diagnostic imaging of other parts of digestive tract: Secondary | ICD-10-CM

## 2017-08-28 DIAGNOSIS — D122 Benign neoplasm of ascending colon: Secondary | ICD-10-CM

## 2017-08-28 HISTORY — PX: POLYPECTOMY: SHX5525

## 2017-08-28 HISTORY — PX: COLONOSCOPY WITH PROPOFOL: SHX5780

## 2017-08-28 LAB — COMPREHENSIVE METABOLIC PANEL
ALK PHOS: 57 U/L (ref 38–126)
ALT: 19 U/L (ref 17–63)
ANION GAP: 9 (ref 5–15)
AST: 20 U/L (ref 15–41)
Albumin: 3.4 g/dL — ABNORMAL LOW (ref 3.5–5.0)
BILIRUBIN TOTAL: 0.7 mg/dL (ref 0.3–1.2)
BUN: 8 mg/dL (ref 6–20)
CALCIUM: 9 mg/dL (ref 8.9–10.3)
CO2: 25 mmol/L (ref 22–32)
CREATININE: 0.81 mg/dL (ref 0.61–1.24)
Chloride: 94 mmol/L — ABNORMAL LOW (ref 101–111)
Glucose, Bld: 89 mg/dL (ref 65–99)
Potassium: 3.8 mmol/L (ref 3.5–5.1)
SODIUM: 128 mmol/L — AB (ref 135–145)
TOTAL PROTEIN: 6.3 g/dL — AB (ref 6.5–8.1)

## 2017-08-28 LAB — TSH: TSH: 1.695 u[IU]/mL (ref 0.350–4.500)

## 2017-08-28 LAB — CYCLIC CITRUL PEPTIDE ANTIBODY, IGG/IGA: CCP Antibodies IgG/IgA: 4 units (ref 0–19)

## 2017-08-28 SURGERY — COLONOSCOPY WITH PROPOFOL
Anesthesia: Monitor Anesthesia Care

## 2017-08-28 MED ORDER — METRONIDAZOLE 500 MG PO TABS: 500.0000 mg | ORAL_TABLET | Freq: Three times a day (TID) | ORAL | 0 refills | Status: DC

## 2017-08-28 MED ORDER — CEPHALEXIN 500 MG PO CAPS: 500.0000 mg | ORAL_CAPSULE | Freq: Four times a day (QID) | ORAL | 0 refills | Status: AC

## 2017-08-28 MED ORDER — PROPOFOL 10 MG/ML IV BOLUS
INTRAVENOUS | Status: AC
  Filled 2017-08-28: qty 20

## 2017-08-28 MED ORDER — MIDAZOLAM HCL 2 MG/2ML IJ SOLN
INTRAMUSCULAR | Status: AC
  Filled 2017-08-28: qty 2

## 2017-08-28 MED ORDER — PANTOPRAZOLE SODIUM 40 MG PO TBEC: 40.0000 mg | DELAYED_RELEASE_TABLET | Freq: Every day | ORAL | 1 refills | Status: AC

## 2017-08-28 MED ORDER — POLYETHYLENE GLYCOL 3350 17 G PO PACK
17.0000 g | PACK | Freq: Every day | ORAL | Status: DC
  Filled 2017-08-28: qty 1

## 2017-08-28 MED ORDER — SIMETHICONE 40 MG/0.6ML PO SUSP
ORAL | Status: DC | PRN
  Administered 2017-08-28: 100 mL

## 2017-08-28 MED ORDER — PROPOFOL 500 MG/50ML IV EMUL
INTRAVENOUS | Status: DC | PRN
  Administered 2017-08-28: 125 ug/kg/min via INTRAVENOUS

## 2017-08-28 MED ORDER — LACTATED RINGERS IV SOLN
INTRAVENOUS | Status: DC
  Administered 2017-08-28: 15:00:00 via INTRAVENOUS

## 2017-08-28 MED ORDER — MAGNESIUM OXIDE 400 (241.3 MG) MG PO TABS: 400.0000 mg | ORAL_TABLET | Freq: Every day | ORAL | 1 refills | Status: AC

## 2017-08-28 MED ORDER — LISINOPRIL 20 MG PO TABS: 20.0000 mg | ORAL_TABLET | Freq: Every day | ORAL | 11 refills | Status: AC

## 2017-08-28 MED ORDER — PREDNISONE 20 MG PO TABS: 20.0000 mg | ORAL_TABLET | Freq: Every day | ORAL | 0 refills | Status: AC

## 2017-08-28 MED ORDER — FENTANYL CITRATE (PF) 100 MCG/2ML IJ SOLN
INTRAMUSCULAR | Status: AC
Start: 2017-08-28 — End: 2017-08-28
  Filled 2017-08-28: qty 2

## 2017-08-28 MED ORDER — FENTANYL CITRATE (PF) 100 MCG/2ML IJ SOLN
25.0000 ug | Freq: Once | INTRAMUSCULAR | Status: AC
  Administered 2017-08-28: 25 ug via INTRAVENOUS

## 2017-08-28 MED ORDER — MIDAZOLAM HCL 5 MG/5ML IJ SOLN
INTRAMUSCULAR | Status: DC | PRN
  Administered 2017-08-28 (×2): 1 mg via INTRAVENOUS

## 2017-08-28 MED ORDER — MIDAZOLAM HCL 2 MG/2ML IJ SOLN
1.0000 mg | INTRAMUSCULAR | Status: DC
  Administered 2017-08-28: 2 mg via INTRAVENOUS

## 2017-08-28 NOTE — Transfer of Care (Signed)
Immediate Anesthesia Transfer of Care Note  Patient: Daniel Barnett  Procedure(s) Performed: Procedure(s) with comments: COLONOSCOPY WITH PROPOFOL (N/A) POLYPECTOMY - polyp at ascending colon x2, descending colon polyp  Patient Location: PACU  Anesthesia Type:MAC  Level of Consciousness: drowsy  Airway & Oxygen Therapy: Patient Spontanous Breathing and Patient connected to face mask oxygen  Post-op Assessment: Report given to RN  Post vital signs: Reviewed and stable  Last Vitals:  Vitals:   08/28/17 1500 08/28/17 1505  BP: 111/72 108/69  Pulse:    Resp: 11 12  Temp:    SpO2: 98% 98%    Last Pain:  Vitals:   08/28/17 1424  TempSrc:   PainSc: 8          Complications: No apparent anesthesia complications

## 2017-08-28 NOTE — Anesthesia Postprocedure Evaluation (Signed)
Anesthesia Post Note  Patient: Daniel Barnett  Procedure(s) Performed: Procedure(s) (LRB): COLONOSCOPY WITH PROPOFOL (N/A) POLYPECTOMY  Patient location during evaluation: PACU Anesthesia Type: MAC Level of consciousness: awake and patient cooperative Pain management: pain level controlled Vital Signs Assessment: post-procedure vital signs reviewed and stable Respiratory status: spontaneous breathing, nonlabored ventilation and respiratory function stable Cardiovascular status: blood pressure returned to baseline Postop Assessment: no signs of nausea or vomiting Anesthetic complications: no     Last Vitals:  Vitals:   08/28/17 1630 08/28/17 1635  BP: (!) 80/54 (!) 85/55  Pulse: (!) 52 (!) 53  Resp: 14 14  Temp:    SpO2: 96% 100%    Last Pain:  Vitals:   08/28/17 1424  TempSrc:   PainSc: 8                  Noel Henandez J

## 2017-08-28 NOTE — Op Note (Signed)
[facility] Patient Name: Daniel Barnett Procedure Date: 08/28/2017 3:19 PM MRN: [id number] Date of Birth: 08-06-1946 Attending MD: Norvel Richards , MD CSN: [id number] Age: 71 Admit Type: Inpatient Procedure:                Colonoscopy Indications:              Abnormal CT of the GI tract Providers:                Norvel Richards, MD, Rosina Lowenstein, RN, Aram Candela Referring MD:             Chevis Pretty FNP Medicines:                Propofol per Anesthesia Complications:            No immediate complications. Estimated Blood Loss:     Estimated blood loss was minimal. Procedure:                Pre-Anesthesia Assessment:                           - Prior to the procedure, a History and Physical                            was performed, and patient medications and                            allergies were reviewed. The patient's tolerance of                            previous anesthesia was also reviewed. The risks                            and benefits of the procedure and the sedation                            options and risks were discussed with the patient.                            All questions were answered, and informed consent                            was obtained. Prior Anticoagulants: The patient has                            taken no previous anticoagulant or antiplatelet                            agents. ASA Grade Assessment: II - A patient with                            mild systemic disease. After reviewing the risks  and benefits, the patient was deemed in                            satisfactory condition to undergo the procedure.                           After obtaining informed consent, the colonoscope                            was passed under direct vision. Throughout the                            procedure, the patient's blood pressure, pulse, and   oxygen saturations were monitored continuously. The                            EC-[id number] ([id number]) scope was introduced through                            the and advanced to the the cecum, identified by                            appendiceal orifice and ileocecal valve. The entire                            colon was well visualized. The ileocecal valve,                            appendiceal orifice, and rectum were photographed.                            The ileocecal valve, appendiceal orifice, and                            rectum were photographed. The patient tolerated the                            procedure well. The quality of the bowel                            preparation was adequate. The entire colon was well                            visualized. Scope In: 3:52:33 PM Scope Out: 4:17:54 PM Scope Withdrawal Time: 0 hours 15 minutes 10 seconds  Total Procedure Duration: 0 hours 25 minutes 21 seconds  Findings:      The perianal and digital rectal examinations were normal.      Three semi-pedunculated polyps were found in the descending colon and       ascending colon. The polyps were 5 to 6 mm in size. These polyps were       removed with a cold snare. Resection and retrieval were complete.       Estimated blood loss was minimal. Estimated blood loss was minimal.      Internal hemorrhoids were found  during retroflexion. The rectal mucosa       was well-seen and appeared normal.      Multiple small and large-mouthed diverticula were found in the colon. Impression:               No evidence of proctitis or colitis.                           - Three 5 to 6 mm polyps in the descending colon                            and in the ascending colon, removed with a cold                            snare. Resected and retrieved.                           - Internal hemorrhoids.                           - Diverticulosis. Moderate Sedation:      Moderate (conscious) sedation was  personally administered by an       anesthesia professional. The following parameters were monitored: oxygen       saturation, heart rate, blood pressure, respiratory rate, EKG, adequacy       of pulmonary ventilation, and response to care. Total physician       intraservice time was 31 minutes. Recommendation:           - Return patient to hospital ward for ongoing care.                           - Mechanical soft diet.                           - Continue present medications. MiraLAX 17 g orally                            at bedtime. Also, daily fiber supplement                            recommended.                           - Repeat colonoscopy date to be determined after                            pending pathology results are reviewed for                            surveillance based on pathology results.                           - Return to GI clinic (date not yet determined). Procedure Code(s):        --- Professional ---  45385, Colonoscopy, flexible; with removal of                            tumor(s), polyp(s), or other lesion(s) by snare                            technique Diagnosis Code(s):        --- Professional ---                           D12.4, Benign neoplasm of descending colon                           D12.2, Benign neoplasm of ascending colon                           K64.8, Other hemorrhoids                           K57.30, Diverticulosis of large intestine without                            perforation or abscess without bleeding                           R93.3, Abnormal findings on diagnostic imaging of                            other parts of digestive tract CPT copyright 2016 [employer]. All rights reserved. The codes documented in this report are preliminary and upon coder review may  be revised to meet current compliance requirements. Cristopher Estimable. Betsy Rosello, MD Norvel Richards, MD 08/28/2017 4:37:36 PM This  report has been signed electronically. Number of Addenda: 0

## 2017-08-28 NOTE — Progress Notes (Signed)
Discussed with nursing, stools running clear after tap water enemas. Plans for colonoscopy today.   Laureen Ochs. Bernarda Caffey [facility] [phone] 9/12/201810:10 AM

## 2017-08-28 NOTE — Anesthesia Preprocedure Evaluation (Signed)
Anesthesia Evaluation  Patient identified by MRN, date of birth, ID band Patient awake    Reviewed: Allergy & Precautions, NPO status , Patient's Chart, lab work & pertinent test results  Airway Mallampati: II  TM Distance: >3 FB     Dental  (+) Poor Dentition, Implants,    Pulmonary Current Smoker,    Pulmonary exam normal        Cardiovascular hypertension, Pt. on medications  Rhythm:Regular Rate:Normal     Neuro/Psych PSYCHIATRIC DISORDERS Anxiety    GI/Hepatic Neg liver ROS, Proctitis    Endo/Other  negative endocrine ROS  Renal/GU negative Renal ROS     Musculoskeletal   Abdominal   Peds  Hematology negative hematology ROS (+)   Anesthesia Other Findings   Reproductive/Obstetrics                             Anesthesia Physical Anesthesia Plan  ASA: II  Anesthesia Plan: MAC   Post-op Pain Management:    Induction: Intravenous  PONV Risk Score and Plan:   Airway Management Planned: Simple Face Mask  Additional Equipment:   Intra-op Plan:   Post-operative Plan:   Informed Consent: I have reviewed the patients History and Physical, chart, labs and discussed the procedure including the risks, benefits and alternatives for the proposed anesthesia with the patient or authorized representative who has indicated his/her understanding and acceptance.     Plan Discussed with:   Anesthesia Plan Comments:         Anesthesia Quick Evaluation

## 2017-08-28 NOTE — Progress Notes (Signed)
PT Cancellation Note  Patient Details Name: Daniel Barnett MRN: [id number] DOB: [birth year]   Cancelled Treatment:    Reason Eval/Treat Not Completed: Medical issues which prohibited therapy.  Refused due to having colonoscopy and still prepping per pt.  Will try again tomorrow.   Ramond Dial 08/28/2017, 11:14 AM   11:14 AM, 08/28/17 Mee Hives, PT, MS Physical Therapist - [place] [phone] (Office)

## 2017-08-28 NOTE — Clinical Social Work Note (Signed)
Patient declines resources and referral to out patient and therapy and psychiatry. Patient stated that he is not going to hurt himself and never was going to hurt himself. He stated that he just wants whatever is wrong with him physically to be figured out.   LCSW signing off.    Araina Butrick, Clydene Pugh, LCSW

## 2017-08-28 NOTE — Care Management (Signed)
Patient will potentially DC home today after colonoscopy. Cleared by psych for DC.  States a friend, Coralyn Pear will be living with him at his home. Recommended for [place] PT and RW. Patient agreeable. Offered Choice of [place] agencies. Romualdo Bolk of [facility] will obtain orders from chart. RW will be delivered to room prior to DC.

## 2017-08-28 NOTE — Discharge Summary (Addendum)
Physician Discharge Summary  Daniel Barnett MRN: [id number] DOB/AGE: [birth year] 71 y.o.  PCP: Chevis Pretty, FNP   Admit date: 08/23/2017 Discharge date: 08/28/2017  Discharge Diagnoses:    Principal Problem:   Proctitis Active Problems:   Essential hypertension, benign   Abdominal pain   Hyponatremia  addendum Dc cancelled due to patient feels weak after colonoscopy Also check B 12 and TSH DC IN AM   Follow-up recommendations Follow-up with PCP in 3-5 days , including all  additional recommended appointments as below Follow-up CBC, CMP in 3-5 days Patient being discharged with home health, patient would need 24/ 7 care from his close friend was started living with him Patient would benefit from outpatient dermatology and rheumatology evaluation for his rash, arthritis    Current Discharge Medication List    START taking these medications   Details  lisinopril (PRINIVIL,ZESTRIL) 20 MG tablet Take 1 tablet (20 mg total) by mouth daily. Qty: 30 tablet, Refills: 11    magnesium oxide (MAG-OX) 400 (241.3 Mg) MG tablet Take 1 tablet (400 mg total) by mouth daily. Qty: 30 tablet, Refills: 1    pantoprazole (PROTONIX) 40 MG tablet Take 1 tablet (40 mg total) by mouth daily. Qty: 30 tablet, Refills: 1    predniSONE (DELTASONE) 20 MG tablet Take 1 tablet (20 mg total) by mouth daily with breakfast. Qty: 10 tablet, Refills: 0      CONTINUE these medications which have NOT CHANGED   Details  allopurinol (ZYLOPRIM) 100 MG tablet Take 1 tablet (100 mg total) by mouth daily. Qty: 90 tablet, Refills: 1   Associated Diagnoses: Chronic gout without tophus, unspecified cause, unspecified site    ALPRAZolam (XANAX) 0.25 MG tablet TAKE 1 TABLET AT BEDTIME  FOR  ANXIETY Qty: 90 tablet, Refills: 1   Associated Diagnoses: Insomnia, unspecified type    metoprolol succinate (TOPROL-XL) 50 MG 24 hr tablet TAKE 1 TABLET DAILY WITH OR IMMEDIATELY FOLLOWING A MEAL Qty: 90 tablet,  Refills: 1   Associated Diagnoses: Essential hypertension, benign    nitrofurantoin, macrocrystal-monohydrate, (MACROBID) 100 MG capsule Take 1 capsule by mouth 2 (two) times daily.    pravastatin (PRAVACHOL) 20 MG tablet TAKE 1 TABLET EVERY DAY Qty: 90 tablet, Refills: 1   Associated Diagnoses: Hyperlipidemia with target LDL less than 100      STOP taking these medications     cephALEXin (KEFLEX) 500 MG capsule      lisinopril-hydrochlorothiazide (PRINZIDE,ZESTORETIC) 20-12.5 MG tablet           Discharge Condition: Stable   Discharge Instructions Get Medicines reviewed and adjusted: Please take all your medications with you for your next visit with your Primary MD  Please request your Primary MD to go over all hospital tests and procedure/radiological results at the follow up, please ask your Primary MD to get all Hospital records sent to his/her office.  If you experience worsening of your admission symptoms, develop shortness of breath, life threatening emergency, suicidal or homicidal thoughts you must seek medical attention immediately by calling 911 or calling your MD immediately if symptoms less severe.  You must read complete instructions/literature along with all the possible adverse reactions/side effects for all the Medicines you take and that have been prescribed to you. Take any new Medicines after you have completely understood and accpet all the possible adverse reactions/side effects.   Do not drive when taking Pain medications.   Do not take more than prescribed Pain, Sleep and Anxiety Medications  Special Instructions:  If you have smoked or chewed Tobacco in the last 2 yrs please stop smoking, stop any regular Alcohol and or any Recreational drug use.  Wear Seat belts while driving.  Please note  You were cared for by a hospitalist during your hospital stay. Once you are discharged, your primary care physician will handle any further medical issues.  Please note that NO REFILLS for any discharge medications will be authorized once you are discharged, as it is imperative that you return to your primary care physician (or establish a relationship with a primary care physician if you do not have one) for your aftercare needs so that they can reassess your need for medications and monitor your lab values.     Allergies  Allergen Reactions  . Ciprofloxacin       Disposition: Home with family   Consults:  Gastroenterology    Significant Diagnostic Studies:  Dg Chest 2 View  Result Date: 08/23/2017 CLINICAL DATA:  Fecal incontinence, shortness of breath. EXAM: CHEST  2 VIEW COMPARISON:  Chest radiograph August 19, 2017 FINDINGS: Cardiomediastinal silhouette is normal. Mildly calcified aortic knob. No pleural effusions or focal consolidations. Trachea projects midline and there is no pneumothorax. Soft tissue planes and included osseous structures are non-suspicious. Moderate acromioclavicular osteoarthrosis. Moderate degenerative change of the spine. IMPRESSION: No acute cardiopulmonary process. Aortic Atherosclerosis (ICD10-I70.0). Electronically Signed   By: Elon Alas M.D.   On: 08/23/2017 22:44   Ct Abdomen Pelvis W Contrast  Result Date: 08/23/2017 CLINICAL DATA:  Unspecified abdominal pain. Patient being treated for urinary tract infection. Every time patient stands up, discharge and feces, out of the rectum. EXAM: CT ABDOMEN AND PELVIS WITH CONTRAST TECHNIQUE: Multidetector CT imaging of the abdomen and pelvis was performed using the standard protocol following bolus administration of intravenous contrast. CONTRAST:  164m ISOVUE-300 IOPAMIDOL (ISOVUE-300) INJECTION 61% COMPARISON:  None. FINDINGS: Lower chest: No acute abnormality.  Coronary artery calcifications. Hepatobiliary: No focal liver abnormality is seen. No gallstones, gallbladder wall thickening, or biliary dilatation. Pancreas: Unremarkable. No pancreatic ductal  dilatation or surrounding inflammatory changes. Spleen: Normal in size without focal abnormality. Adrenals/Urinary Tract: No adrenal gland nodules. Renal nephrograms are symmetrical. No solid mass or hydronephrosis. Bladder wall is diffusely thickened consistent with history of urinary tract infection. No intraluminal filling defects. There is a diverticulum arising from the left posterior bladder wall. Stomach/Bowel: Diffuse thickening of the rectal wall with slight asymmetry. Mild stranding in the perirectal fat. This probably represents proctitis in the setting of infection but colon cancer is not excluded and follow-up evaluation is recommended after resolution of the acute process. Consider follow-up with proctoscopy or sigmoidoscopy. Scattered stool in the colon. No colonic distention. Appendix is normal. Stomach and small bowel are not abnormally distended. Vascular/Lymphatic: Aortic atherosclerosis. No enlarged abdominal or pelvic lymph nodes. Calcification and noncalcific plaque formation demonstrated in the origin of the superior mesenteric artery suggesting proximal stenosis. The distal vessel is patent. Reproductive: Prostate is unremarkable. Other: No free air or free fluid in the abdomen. No abdominal or pelvic abscesses. Abdominal wall musculature appears intact. Musculoskeletal: Degenerative changes in the spine. No acute displaced fractures identified. Spondylolysis with mild spondylolisthesis at L5-S1. IMPRESSION: 1. Bladder wall thickening consistent with history of urinary tract infection. Bladder diverticulum. 2. Rectal wall thickening with some asymmetry and peripheral stranding. This likely represents proctitis but rectal cancer is not excluded and follow-up is recommended after resolution of acute process. 3. Prominent aortic atherosclerosis. 4. 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 Electronically Signed   By: Lucienne Capers M.D.    On: 08/23/2017 23:00        Filed Weights   08/23/17 1907  Weight: 101.6 kg (224 lb)     Microbiology: Recent Results (from the past 240 hour(s))  Urine culture     Status: None   Collection Time: 08/23/17  7:52 PM  Result Value Ref Range Status   Specimen Description URINE, CLEAN CATCH  Final   Special Requests NONE  Final   Culture   Final    NO GROWTH Performed at [facility], [place] [address], [place], [place] [address]    Report Status 08/25/2017 FINAL  Final  Urine culture     Status: None   Collection Time: 08/24/17 12:39 AM  Result Value Ref Range Status   Specimen Description URINE, CLEAN CATCH  Final   Special Requests NONE  Final   Culture   Final    NO GROWTH Performed at [facility], [place] [address] [place], [place] [address]    Report Status 08/25/2017 FINAL  Final       Blood Culture    Component Value Date/Time   SDES URINE, CLEAN CATCH 08/24/2017 0039   SPECREQUEST NONE 08/24/2017 0039   CULT  08/24/2017 0039    NO GROWTH Performed at [facility], [place] [address], [place],  [address]    REPTSTATUS 08/25/2017 FINAL 08/24/2017 3474      Labs: Results for orders placed or performed during the hospital encounter of 08/23/17 (from the past 48 hour(s))  Sedimentation rate     Status: Abnormal   Collection Time: 08/26/17  3:33 PM  Result Value Ref Range   Sed Rate 20 (H) 0 - 16 mm/hr  HIV antibody     Status: None   Collection Time: 08/26/17  3:33 PM  Result Value Ref Range   HIV Screen 4th Generation wRfx Non Reactive Non Reactive    Comment: (NOTE) Performed At: [facility] [place], [place] [id number] Lindon Romp MD IE:[id number]   Hepatitis panel, acute     Status: None   Collection Time: 08/26/17  3:33 PM  Result Value Ref Range   Hepatitis B Surface Ag Negative Negative   HCV Ab <0.1 0.0 - 0.9 s/co ratio    Comment: (NOTE)                                  Negative:     < 0.8                              Indeterminate: 0.8 - 0.9                                  Positive:     > 0.9 The CDC recommends that a positive HCV antibody result be followed up with a HCV Nucleic Acid Amplification test (660630). Performed At: [facility] [place], [place] [id number] Lindon Romp MD [id number]    Hep A IgM Negative Negative   Hep B C IgM Negative Negative  CBC     Status: Abnormal   Collection Time: 08/27/17  5:54 AM  Result Value Ref Range  WBC 8.0 4.0 - 10.5 K/uL   RBC 4.07 (L) 4.22 - 5.81 MIL/uL   Hemoglobin 13.7 13.0 - 17.0 g/dL   HCT 37.8 (L) 39.0 - 52.0 %   MCV 92.9 78.0 - 100.0 fL   MCH 33.7 26.0 - 34.0 pg   MCHC 36.2 (H) 30.0 - 36.0 g/dL   RDW 11.9 11.5 - 15.5 %   Platelets 205 150 - 400 K/uL  Comprehensive metabolic panel     Status: Abnormal   Collection Time: 08/27/17  5:54 AM  Result Value Ref Range   Sodium 130 (L) 135 - 145 mmol/L   Potassium 3.9 3.5 - 5.1 mmol/L   Chloride 98 (L) 101 - 111 mmol/L   CO2 26 22 - 32 mmol/L   Glucose, Bld 87 65 - 99 mg/dL   BUN 8 6 - 20 mg/dL   Creatinine, Ser 0.78 0.61 - 1.24 mg/dL   Calcium 8.5 (L) 8.9 - 10.3 mg/dL   Total Protein 6.1 (L) 6.5 - 8.1 g/dL   Albumin 3.1 (L) 3.5 - 5.0 g/dL   AST 21 15 - 41 U/L   ALT 18 17 - 63 U/L   Alkaline Phosphatase 53 38 - 126 U/L   Total Bilirubin 0.6 0.3 - 1.2 mg/dL   GFR calc non Af Amer >60 >60 mL/min   GFR calc Af Amer >60 >60 mL/min    Comment: (NOTE) The eGFR has been calculated using the CKD EPI equation. This calculation has not been validated in all clinical situations. eGFR's persistently <60 mL/min signify possible Chronic Kidney Disease.    Anion gap 6 5 - 15  Comprehensive metabolic panel     Status: Abnormal   Collection Time: 08/28/17  5:56 AM  Result Value Ref Range   Sodium 128 (L) 135 - 145 mmol/L   Potassium 3.8 3.5 - 5.1 mmol/L   Chloride 94 (L) 101 - 111 mmol/L   CO2 25 22 - 32 mmol/L   Glucose, Bld 89 65 - 99  mg/dL   BUN 8 6 - 20 mg/dL   Creatinine, Ser 0.81 0.61 - 1.24 mg/dL   Calcium 9.0 8.9 - 10.3 mg/dL   Total Protein 6.3 (L) 6.5 - 8.1 g/dL   Albumin 3.4 (L) 3.5 - 5.0 g/dL   AST 20 15 - 41 U/L   ALT 19 17 - 63 U/L   Alkaline Phosphatase 57 38 - 126 U/L   Total Bilirubin 0.7 0.3 - 1.2 mg/dL   GFR calc non Af Amer >60 >60 mL/min   GFR calc Af Amer >60 >60 mL/min    Comment: (NOTE) The eGFR has been calculated using the CKD EPI equation. This calculation has not been validated in all clinical situations. eGFR's persistently <60 mL/min signify possible Chronic Kidney Disease.    Anion gap 9 5 - 15     Lipid Panel     Component Value Date/Time   CHOL 141 03/25/2017 1230   CHOL 172 04/13/2013 1247   TRIG 120 03/25/2017 1230   TRIG 70 04/18/2015 1148   TRIG 91 04/13/2013 1247   HDL 62 03/25/2017 1230   HDL 73 04/18/2015 1148   HDL 45 04/13/2013 1247   CHOLHDL 2.3 03/25/2017 1230   LDLCALC 55 03/25/2017 1230   LDLCALC 69 10/08/2014 1129   LDLCALC 109 (H) 04/13/2013 1247     No results found for: HGBA1C   Lab Results  Component Value Date   LDLCALC 55 03/25/2017   CREATININE 0.81 08/28/2017  HPI   71 y.o. male with medical history significant of htn, hld, freq uti, constipation comes in from home with 6 months of having constipation and having trouble urinating.  c/o gradual onset and persistence of constant generalized abd "pain" for the past few days.  Has been associated with decreased PO intake. Pt states when he "stands up stool comes running out of me" and he feels "lightheaded." Pt states he has been taking miralax for constipation for the past week. Pt states he was evaluated at [facility] ED x2, dx UTI and rx keflex. States he went back because he "didn't like the side effects of keflex" so it was changed to macrobid.  Denied  N/V, no fevers, no back pain, no rash, no CP/SOB, no black or blood in stools, no testicular pain/swelling, no dysuria/hematuria, no penile  discharge.    patient is extremely depressed and upset and also endorses a final ideation at the time of admission. CT abdomen pelvis showed Bladder wall thickening consistent with history of urinary tract infection. 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,   ESR 20, improved after patient started on prednisone 50 mg a   Arthropathy and rash both improved, decreased prednisone to 20 mg a day Discussed with daughter patient would need referral for dermatology and rheumatology  anti-CCP antibody pending at the time of discharge  Hyponatremia - Dehydration /HCTZ HCTZ has been discontinued, continue lisinopril alone for hypertension  Acute kidney injury GFR < 60 at presentation - most consistent with prerenal azotemia - resolved w/ volume expansion   Hypomagnesemia Replace and follow trend   Hypokalemia  Cont to replace to goal of 4.0   HTN Blood pressure currently well controlled  Suicidal ideation  Reportedly told ER RN he wanted to die - some reports of actually attempting to shoot himself prior to coming to the ED -patient evaluated by psychiatry 9/10 , patient's daughter endorses that the patient is not suicidal, they are not concerned about patient being discharged home. Psychiatry recommended discharge home when medically stable Requested psychiatry to clarify 9/11, psychiatry again determined that the patient is not imminent risk to self-harm, does not meet  criteria for inpatient psych   Discharge Exam:   Blood pressure (!) 164/80, pulse 66, temperature 97.6 F (36.4 C), temperature source Oral, resp. rate 18, height '6\' 1"'  (1.854 m), weight 101.6 kg (224 lb), SpO2 100 %.  Heart:  S1 S2 present without murmurs  Abdomen:  Soft, mild discomfort with palpation periumbilically and nondistended. No masses, hepatosplenomegaly or hernias noted. Normal bowel sounds, without guarding, and without rebound.   Rectal:  Deferred  Msk:  Symmetrical without gross deformities. Normal posture. Extremities:  Without clubbing or edema. Neurologic:  Alert and  oriented x4;  grossly normal neurologically. Psych:  Alert and cooperative. Normal mood and affect.    Follow-up Information    Chevis Pretty, FNP. Call.   Specialty:  Family Medicine Why:  Hospital follow-up in 3-5 days Contact information: [place] [address] [phone]           Signed: Reyne Dumas 08/28/2017, 10:19  AM        Time spent >1 hour

## 2017-08-29 ENCOUNTER — Inpatient Hospital Stay (HOSPITAL_COMMUNITY): Payer: Medicare HMO

## 2017-08-29 ENCOUNTER — Telehealth: Payer: Self-pay | Admitting: Gastroenterology

## 2017-08-29 ENCOUNTER — Encounter (HOSPITAL_COMMUNITY): Payer: Self-pay | Admitting: Internal Medicine

## 2017-08-29 DIAGNOSIS — R9431 Abnormal electrocardiogram [ECG] [EKG]: Secondary | ICD-10-CM

## 2017-08-29 LAB — BASIC METABOLIC PANEL
Anion gap: 10 (ref 5–15)
BUN: 9 mg/dL (ref 6–20)
CHLORIDE: 97 mmol/L — AB (ref 101–111)
CO2: 24 mmol/L (ref 22–32)
Calcium: 8.7 mg/dL — ABNORMAL LOW (ref 8.9–10.3)
Creatinine, Ser: 0.85 mg/dL (ref 0.61–1.24)
GFR calc Af Amer: >60 mL/min (ref >60)
Glucose, Bld: 76 mg/dL (ref 65–99)
Potassium: 3.9 mmol/L (ref 3.5–5.1)
Sodium: 131 mmol/L — ABNORMAL LOW (ref 135–145)

## 2017-08-29 LAB — ECHOCARDIOGRAM COMPLETE
CHL CUP MV DEC (S): 475
E decel time: 475 msec
EERAT: 7.31
FS: 34 % (ref 28–44)
HEIGHTINCHES: 73 in
IVS/LV PW RATIO, ED: 1.18
LA vol index: 31.3 mL/m2
LA vol: 72.2 mL
LADIAMINDEX: 1.39 cm/m2
LASIZE: 32 mm
LAVOLA4C: 62.2 mL
LEFT ATRIUM END SYS DIAM: 32 mm
LV E/e'average: 7.31
LV PW d: 11.8 mm — AB (ref 0.6–1.1)
LV sys vol index: 10 mL/m2
LVDIAVOL: 66 mL (ref 62–150)
LVDIAVOLIN: 29 mL/m2
LVEEMED: 7.31
LVELAT: 9.9 cm/s
LVOT SV: 73 mL
LVOT VTI: 23.1 cm
LVOT area: 3.14 cm2
LVOT diameter: 20 mm
LVOT peak grad rest: 5 mmHg
LVOTPV: 110 cm/s
LVSYSVOL: 22 mL
MV pk E vel: 72.4 m/s
MVPG: 2 mmHg
MVPKAVEL: 111 m/s
RV LATERAL S' VELOCITY: 13.1 cm/s
Simpson's disk: 66
Stroke v: 44 ml
TAPSE: 22.6 mm
TDI e' lateral: 9.9
TDI e' medial: 6.64
Weight: 3584 oz

## 2017-08-29 LAB — VITAMIN B12: VITAMIN B 12: 204 pg/mL (ref 180–914)

## 2017-08-29 MED ORDER — FOLIC ACID 400 MCG PO TABS: 400.0000 ug | ORAL_TABLET | Freq: Every day | ORAL | 3 refills | Status: AC

## 2017-08-29 MED ORDER — POLYETHYLENE GLYCOL 3350 17 G PO PACK: 17.0000 g | PACK | Freq: Every day | ORAL | 0 refills | Status: DC

## 2017-08-29 MED ORDER — THIAMINE HCL 100 MG PO TABS: 100.0000 mg | ORAL_TABLET | Freq: Every day | ORAL | 2 refills | Status: AC

## 2017-08-29 NOTE — Telephone Encounter (Signed)
PATIENT SCHEDULED AND NURSE ON 300 WILL LET HIM KNOW

## 2017-08-29 NOTE — Progress Notes (Signed)
*  PRELIMINARY RESULTS* Echocardiogram 2D Echocardiogram has been performed.  Daniel Barnett 08/29/2017, 2:10 PM

## 2017-08-29 NOTE — Clinical Social Work Note (Signed)
Patient indicates that he has transportation. He states that his neighbor is going to pick him up as soon as he know for sure when he is leaving.    LCSW signing off.    Daniel Barnett, Daniel Pugh, LCSW

## 2017-08-29 NOTE — Evaluation (Signed)
Occupational Therapy Evaluation Patient Details Name: Daniel Barnett MRN: [id number] DOB: 1946-02-11 Today's Date: 08/29/2017    History of Present Illness 71 yo male with onset of suspected suicidal tendencies was admitted and was noted to have AKI and suspected rectal CA.  Has testing ongoing for clearing out the diagnoses, and geripsych consult pending.  PMHx:  HTN, HLD, UTI   Clinical Impression   Pt agreeable to OT evaluation. Pt demonstrates independence in ADL completion, supervision for standing tasks and functional mobility. During session pt using RW for mobility purposes, no LOB during session, verbal cuing for correct walker usage. Pt reports his friend is moving in with him and will be available to assist as needed 24/7. No further acute OT services required at this time.     Follow Up Recommendations  No OT follow up;Supervision/Assistance - 24 hour    Equipment Recommendations  None recommended by OT       Precautions / Restrictions Precautions Precautions: Fall Precaution Comments: pt is dizzy upon standing and has concern for his balance Restrictions Weight Bearing Restrictions: No      Mobility Bed Mobility Overal bed mobility: Modified Independent                Transfers Overall transfer level: Needs assistance Equipment used: Rolling walker (2 wheeled) Transfers: Sit to/from Stand Sit to Stand: Supervision                  ADL either performed or assessed with clinical judgement   ADL Overall ADL's : Needs assistance/impaired     Grooming: Wash/dry hands;Wash/dry face;Supervision/safety;Standing                   Toilet Transfer: Supervision/safety;Regular Museum/gallery exhibitions officer and Hygiene: Supervision/safety;Sit to/from stand       Functional mobility during ADLs: Supervision/safety;Rolling walker       Vision Baseline Vision/History: Wears glasses Wears Glasses: At all times Patient Visual  Report: No change from baseline Vision Assessment?: No apparent visual deficits            Pertinent Vitals/Pain Pain Assessment: No/denies pain     Hand Dominance Right   Extremity/Trunk Assessment Upper Extremity Assessment Upper Extremity Assessment: Overall WFL for tasks assessed (difficulty with grip due to edema)   Lower Extremity Assessment Lower Extremity Assessment: Defer to PT evaluation   Cervical / Trunk Assessment Cervical / Trunk Assessment: Normal   Communication Communication Communication: No difficulties   Cognition Arousal/Alertness: Awake/alert Behavior During Therapy: Flat affect Overall Cognitive Status: Within Functional Limits for tasks assessed                                                Home Living Family/patient expects to be discharged to:: Private residence Living Arrangements: Alone Available Help at Discharge: Friend(s);Home health;Available 24 hours/day Type of Home: House Home Access: Stairs to enter [employer] of Steps: 4 Entrance Stairs-Rails: Left Home Layout: One level     Bathroom Shower/Tub: Teacher, early years/pre: Standard     Home Equipment: Environmental consultant - 2 wheels          Prior Functioning/Environment Level of Independence: Independent    ADL's / Homemaking Assistance Needed: reports independence in ADL tasks; does not drive car but drives scooter  OT Problem List: Decreased activity tolerance;Impaired balance (sitting and/or standing)       End of Session Equipment Utilized During Treatment: Gait belt;Rolling walker  Activity Tolerance: Patient tolerated treatment well Patient left: in chair;with call bell/phone within reach;with chair alarm set  OT Visit Diagnosis: Muscle weakness (generalized) (M62.81);Unsteadiness on feet (R26.81)                Time: 2919-1660 OT Time Calculation (min): 33 min Charges:  OT General Charges $OT Visit: 1 Visit OT  Evaluation $OT Eval Low Complexity: Moody, OTR/L  [phone] 08/29/2017, 9:20 AM

## 2017-08-29 NOTE — Anesthesia Postprocedure Evaluation (Signed)
Anesthesia Post Note  Patient: Daniel Barnett  Procedure(s) Performed: Procedure(s) (LRB): COLONOSCOPY WITH PROPOFOL (N/A) POLYPECTOMY  Patient location during evaluation: Nursing Unit Anesthesia Type: MAC Level of consciousness: awake Pain management: satisfactory to patient Vital Signs Assessment: post-procedure vital signs reviewed and stable Respiratory status: spontaneous breathing Cardiovascular status: stable Postop Assessment: no signs of nausea or vomiting Anesthetic complications: no     Last Vitals:  Vitals:   08/28/17 2156 08/29/17 0250  BP: 124/64 (!) 142/70  Pulse: 65 75  Resp: 20 16  Temp: 36.7 C 36.4 C  SpO2: 99% 100%    Last Pain:  Vitals:   08/29/17 0250  TempSrc: Oral  PainSc:                  Drucie Opitz

## 2017-08-29 NOTE — Telephone Encounter (Signed)
Patient needs hospital follow up in 4 weeks for weight loss, may need formal CTA abd.

## 2017-08-29 NOTE — Care Management Note (Signed)
Case Management Note  Patient Details  Name: Daniel Barnett MRN: [id number] Date of Birth: 01/12/1946   If discussed at [place] Length of Stay Meetings, dates discussed:   08/29/2017 Additional Comments:  Drue Camera, Chauncey Reading, RN 08/29/2017, 12:06 PM

## 2017-08-29 NOTE — Discharge Summary (Addendum)
Physician Discharge Summary  Daniel Barnett MRN: [id number] DOB/AGE: Dec 13, 1946 70 y.o.  PCP: Chevis Pretty, FNP   Admit date: 08/23/2017 Discharge date: 08/29/2017  Discharge Diagnoses:    Principal Problem:   Proctitis Active Problems:   Essential hypertension, benign   Abdominal pain   Hyponatremia      Follow-up recommendations Follow-up with PCP in 3-5 days , including all  additional recommended appointments as below Follow-up CBC, CMP in 3-5 days Patient being discharged with home health, patient would need 24/ 7 care from his close friend was started living with him Patient would benefit from outpatient dermatology and rheumatology evaluation for his rash, arthritis Alcohol cessation recommended     Current Discharge Medication List    START taking these medications   Details  folic acid (FOLVITE) 409 MCG tablet Take 1 tablet (400 mcg total) by mouth daily. Qty: 100 tablet, Refills: 3    lisinopril (PRINIVIL,ZESTRIL) 20 MG tablet Take 1 tablet (20 mg total) by mouth daily. Qty: 30 tablet, Refills: 11    magnesium oxide (MAG-OX) 400 (241.3 Mg) MG tablet Take 1 tablet (400 mg total) by mouth daily. Qty: 30 tablet, Refills: 1    pantoprazole (PROTONIX) 40 MG tablet Take 1 tablet (40 mg total) by mouth daily. Qty: 30 tablet, Refills: 1    polyethylene glycol (MIRALAX / GLYCOLAX) packet Take 17 g by mouth daily. Qty: 14 each, Refills: 0    predniSONE (DELTASONE) 20 MG tablet Take 1 tablet (20 mg total) by mouth daily with breakfast. Qty: 10 tablet, Refills: 0    thiamine 100 MG tablet Take 1 tablet (100 mg total) by mouth daily. Qty: 30 tablet, Refills: 2      CONTINUE these medications which have CHANGED   Details  cephALEXin (KEFLEX) 500 MG capsule Take 1 capsule (500 mg total) by mouth 4 (four) times daily. Qty: 12 capsule, Refills: 0      CONTINUE these medications which have NOT CHANGED   Details  allopurinol (ZYLOPRIM) 100 MG tablet Take 1  tablet (100 mg total) by mouth daily. Qty: 90 tablet, Refills: 1   Associated Diagnoses: Chronic gout without tophus, unspecified cause, unspecified site    ALPRAZolam (XANAX) 0.25 MG tablet TAKE 1 TABLET AT BEDTIME  FOR  ANXIETY Qty: 90 tablet, Refills: 1   Associated Diagnoses: Insomnia, unspecified type    metoprolol succinate (TOPROL-XL) 50 MG 24 hr tablet TAKE 1 TABLET DAILY WITH OR IMMEDIATELY FOLLOWING A MEAL Qty: 90 tablet, Refills: 1   Associated Diagnoses: Essential hypertension, benign    nitrofurantoin, macrocrystal-monohydrate, (MACROBID) 100 MG capsule Take 1 capsule by mouth 2 (two) times daily.    pravastatin (PRAVACHOL) 20 MG tablet TAKE 1 TABLET EVERY DAY Qty: 90 tablet, Refills: 1   Associated Diagnoses: Hyperlipidemia with target LDL less than 100      STOP taking these medications     lisinopril-hydrochlorothiazide (PRINZIDE,ZESTORETIC) 20-12.5 MG tablet            Discharge Condition: Stable   Discharge Instructions Get Medicines reviewed and adjusted: Please take all your medications with you for your next visit with your Primary MD  Please request your Primary MD to go over all hospital tests and procedure/radiological results at the follow up, please ask your Primary MD to get all Hospital records sent to his/her office.  If you experience worsening of your admission symptoms, develop shortness of breath, life threatening emergency, suicidal or homicidal thoughts you must seek medical attention immediately by calling  911 or calling your MD immediately if symptoms less severe.  You must read complete instructions/literature along with all the possible adverse reactions/side effects for all the Medicines you take and that have been prescribed to you. Take any new Medicines after you have completely understood and accpet all the possible adverse reactions/side effects.   Do not drive when taking Pain medications.   Do not take more than prescribed  Pain, Sleep and Anxiety Medications  Special Instructions: If you have smoked or chewed Tobacco in the last 2 yrs please stop smoking, stop any regular Alcohol and or any Recreational drug use.  Wear Seat belts while driving.  Please note  You were cared for by a hospitalist during your hospital stay. Once you are discharged, your primary care physician will handle any further medical issues. Please note that NO REFILLS for any discharge medications will be authorized once you are discharged, as it is imperative that you return to your primary care physician (or establish a relationship with a primary care physician if you do not have one) for your aftercare needs so that they can reassess your need for medications and monitor your lab values.     Allergies  Allergen Reactions  . Ciprofloxacin       Disposition: Home with family   Consults:  Gastroenterology    Significant Diagnostic Studies:  Dg Chest 2 View  Result Date: 08/23/2017 CLINICAL DATA:  Fecal incontinence, shortness of breath. EXAM: CHEST  2 VIEW COMPARISON:  Chest radiograph August 19, 2017 FINDINGS: Cardiomediastinal silhouette is normal. Mildly calcified aortic knob. No pleural effusions or focal consolidations. Trachea projects midline and there is no pneumothorax. Soft tissue planes and included osseous structures are non-suspicious. Moderate acromioclavicular osteoarthrosis. Moderate degenerative change of the spine. IMPRESSION: No acute cardiopulmonary process. Aortic Atherosclerosis (ICD10-I70.0). Electronically Signed   By: Elon Alas M.D.   On: 08/23/2017 22:44   Ct Abdomen Pelvis W Contrast  Result Date: 08/23/2017 CLINICAL DATA:  Unspecified abdominal pain. Patient being treated for urinary tract infection. Every time patient stands up, discharge and feces, out of the rectum. EXAM: CT ABDOMEN AND PELVIS WITH CONTRAST TECHNIQUE: Multidetector CT imaging of the abdomen and pelvis was performed using  the standard protocol following bolus administration of intravenous contrast. CONTRAST:  169m ISOVUE-300 IOPAMIDOL (ISOVUE-300) INJECTION 61% COMPARISON:  None. FINDINGS: Lower chest: No acute abnormality.  Coronary artery calcifications. Hepatobiliary: No focal liver abnormality is seen. No gallstones, gallbladder wall thickening, or biliary dilatation. Pancreas: Unremarkable. No pancreatic ductal dilatation or surrounding inflammatory changes. Spleen: Normal in size without focal abnormality. Adrenals/Urinary Tract: No adrenal gland nodules. Renal nephrograms are symmetrical. No solid mass or hydronephrosis. Bladder wall is diffusely thickened consistent with history of urinary tract infection. No intraluminal filling defects. There is a diverticulum arising from the left posterior bladder wall. Stomach/Bowel: Diffuse thickening of the rectal wall with slight asymmetry. Mild stranding in the perirectal fat. This probably represents proctitis in the setting of infection but colon cancer is not excluded and follow-up evaluation is recommended after resolution of the acute process. Consider follow-up with proctoscopy or sigmoidoscopy. Scattered stool in the colon. No colonic distention. Appendix is normal. Stomach and small bowel are not abnormally distended. Vascular/Lymphatic: Aortic atherosclerosis. No enlarged abdominal or pelvic lymph nodes. Calcification and noncalcific plaque formation demonstrated in the origin of the superior mesenteric artery suggesting proximal stenosis. The distal vessel is patent. Reproductive: Prostate is unremarkable. Other: No free air or free fluid in the abdomen. No abdominal  or pelvic abscesses. Abdominal wall musculature appears intact. Musculoskeletal: Degenerative changes in the spine. No acute displaced fractures identified. Spondylolysis with mild spondylolisthesis at L5-S1. IMPRESSION: 1. Bladder wall thickening consistent with history of urinary tract infection. Bladder  diverticulum. 2. Rectal wall thickening with some asymmetry and peripheral stranding. This likely represents proctitis but rectal cancer is not excluded and follow-up is recommended after resolution of acute process. 3. Prominent aortic atherosclerosis. 4. 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 Electronically Signed   By: Lucienne Capers M.D.   On: 08/23/2017 23:00        Filed Weights   08/23/17 1907  Weight: 101.6 kg (224 lb)     Microbiology: Recent Results (from the past 240 hour(s))  Urine culture     Status: None   Collection Time: 08/23/17  7:52 PM  Result Value Ref Range Status   Specimen Description URINE, CLEAN CATCH  Final   Special Requests NONE  Final   Culture   Final    NO GROWTH Performed at [facility], [place] [address] [place], [place] [address]    Report Status 08/25/2017 FINAL  Final  Urine culture     Status: None   Collection Time: 08/24/17 12:39 AM  Result Value Ref Range Status   Specimen Description URINE, CLEAN CATCH  Final   Special Requests NONE  Final   Culture   Final    NO GROWTH Performed at [facility], [place] [address], [place], [place] [address]    Report Status 08/25/2017 FINAL  Final       Blood Culture    Component Value Date/Time   SDES URINE, CLEAN CATCH 08/24/2017 0039   SPECREQUEST NONE 08/24/2017 0039   CULT  08/24/2017 0039    NO GROWTH Performed at [facility], [place] [address] [place], [place] [address]    REPTSTATUS 08/25/2017 FINAL 08/24/2017 0039      Labs: Results for orders placed or performed during the hospital encounter of 08/23/17 (from the past 48 hour(s))  Comprehensive metabolic panel     Status: Abnormal   Collection Time: 08/28/17  5:56 AM  Result Value Ref Range   Sodium 128 (L) 135 - 145 mmol/L   Potassium 3.8 3.5 - 5.1 mmol/L   Chloride 94 (L) 101 - 111 mmol/L   CO2 25 22 - 32 mmol/L   Glucose, Bld 89 65 - 99 mg/dL    BUN 8 6 - 20 mg/dL   Creatinine, Ser 0.81 0.61 - 1.24 mg/dL   Calcium 9.0 8.9 - 10.3 mg/dL   Total Protein 6.3 (L) 6.5 - 8.1 g/dL   Albumin 3.4 (L) 3.5 - 5.0 g/dL   AST 20 15 - 41 U/L   ALT 19 17 - 63 U/L   Alkaline Phosphatase 57 38 - 126 U/L   Total Bilirubin 0.7 0.3 - 1.2 mg/dL   GFR calc non Af Amer >60 >60 mL/min   GFR calc Af Amer >60 >60 mL/min    Comment: (NOTE) The eGFR has been calculated using the CKD EPI equation. This calculation has not been validated in all clinical situations. eGFR's persistently <60 mL/min signify possible Chronic Kidney Disease.    Anion gap 9 5 - 15  Vitamin B12     Status: None   Collection Time: 08/28/17  7:54 PM  Result Value Ref Range   Vitamin B-12 204 180 - 914 pg/mL  Comment: (NOTE) This assay is not validated for testing neonatal or myeloproliferative syndrome specimens for Vitamin B12 levels. Performed at [facility], [place] [address] [place], [place] [address]   TSH     Status: None   Collection Time: 08/28/17  7:54 PM  Result Value Ref Range   TSH 1.695 0.350 - 4.500 uIU/mL    Comment: Performed by a 3rd Generation assay with a functional sensitivity of <=0.01 uIU/mL.  Basic metabolic panel     Status: Abnormal   Collection Time: 08/29/17  4:19 AM  Result Value Ref Range   Sodium 131 (L) 135 - 145 mmol/L   Potassium 3.9 3.5 - 5.1 mmol/L   Chloride 97 (L) 101 - 111 mmol/L   CO2 24 22 - 32 mmol/L   Glucose, Bld 76 65 - 99 mg/dL   BUN 9 6 - 20 mg/dL   Creatinine, Ser 0.85 0.61 - 1.24 mg/dL   Calcium 8.7 (L) 8.9 - 10.3 mg/dL   GFR calc non Af Amer >60 >60 mL/min   GFR calc Af Amer >60 >60 mL/min    Comment: (NOTE) The eGFR has been calculated using the CKD EPI equation. This calculation has not been validated in all clinical situations. eGFR's persistently <60 mL/min signify possible Chronic Kidney Disease.    Anion gap 10 5 - 15     Lipid Panel     Component Value Date/Time   CHOL 141 03/25/2017 1230    CHOL 172 04/13/2013 1247   TRIG 120 03/25/2017 1230   TRIG 70 04/18/2015 1148   TRIG 91 04/13/2013 1247   HDL 62 03/25/2017 1230   HDL 73 04/18/2015 1148   HDL 45 04/13/2013 1247   CHOLHDL 2.3 03/25/2017 1230   LDLCALC 55 03/25/2017 1230   LDLCALC 69 10/08/2014 1129   LDLCALC 109 (H) 04/13/2013 1247     No results found for: HGBA1C   Lab Results  Component Value Date   LDLCALC 55 03/25/2017   CREATININE 0.85 08/29/2017   ------------------------------------------------------------------- LV EF: 60% -   65%  ------------------------------------------------------------------- Indications:      Abnormal EKG 794.31.  ------------------------------------------------------------------- History:   PMH:  Acquired from the patient and from the patient&'s chart.  Risk factors:  Hyperlipidemia. Hypertension.  ------------------------------------------------------------------- Study Conclusions  - Left ventricle: The cavity size was normal. Wall thickness was   increased in a pattern of mild LVH. Systolic function was normal.   The estimated ejection fraction was in the range of 60% to 65%.   Wall motion was normal; there were no regional wall motion   abnormalities. Doppler parameters are consistent with abnormal   left ventricular relaxation (grade 1 diastolic dysfunction). - Aortic valve: Moderately to severely calcified annulus.   Trileaflet. There was no stenosis. - Mitral valve: Calcified annulus. ----------------------------------------------------------------------------------------------------------------------------  HPI   71 y.o. male with medical history significant of htn, hld, freq uti, constipation comes in from home with 6 months of having constipation and having trouble urinating.  c/o gradual onset and persistence of constant generalized abd "pain" for the past few days.  Has been associated with decreased PO intake. Pt states when he "stands up stool comes  running out of me" and he feels "lightheaded." Pt states he has been taking miralax for constipation for the past week. Pt states he was evaluated at [facility] ED x2, dx UTI and rx keflex. States he went back because he "didn't like the side effects of keflex" so it was changed to  macrobid.  Denied  N/V, no fevers, no back pain, no rash, no CP/SOB, no black or blood in stools, no testicular pain/swelling, no dysuria/hematuria, no penile discharge.    patient is extremely depressed and upset and also endorses a final ideation at the time of admission. CT abdomen pelvis showed Bladder wall thickening consistent with history of urinary tract infection. Bladder diverticulum,Rectal wall thickening with some asymmetry and peripheral stranding  HOSPITAL COURSE:    Proctitis -initially started on treatment with antibiotics, received Unasyn, ciprofloxacin, subsequently changed to Rocephin and Flagyl Also treated with hydrocortisone suppositories, some improvement with empiric treatment GI was consulted and patient underwent colonoscopy , did not see any proctitis or colitis, patient does have internal hemorrhoids, 3 polyps that were removed Hydrocortisone discontinued per GI recommendations HIV negative  Bilateral hand pain, likely has psoriatic arthritis improved after starting steroids Likely has psoriatic arthritis, exacerbated by his excessive alcohol use,   ESR 20, improved after patient started on prednisone 50 mg a   Arthropathy and rash both improved, decreased prednisone to 20 mg a day Discussed with daughter patient would need referral for dermatology and rheumatology  anti-CCP antibody negative  Hyponatremia - Dehydration /HCTZ HCTZ has been discontinued, continue lisinopril alone for hypertension  Acute kidney injury GFR < 60 at presentation - most consistent with prerenal azotemia - resolved w/ volume expansion   Abnormal troponin No chest pain or sob, echo wnl , results as above    Hypomagnesemia Replace and follow trend   Hypokalemia  Cont to replace to goal of 4.0   Alcohol dependence Patient's daughter clearly stated that the patient drinks at least 4 beers a day Start patient on folic acid and thiamine supplementation Alcohol cessation counseling done  HTN Blood pressure currently well controlled  Suicidal ideation  Reportedly told ER RN he wanted to die - some reports of actually attempting to shoot himself prior to coming to the ED -patient evaluated by psychiatry 9/10 , patient's daughter endorses that the patient is not suicidal, they are not concerned about patient being discharged home. Psychiatry recommended discharge home when medically stable Requested psychiatry to clarify 9/11, psychiatry again determined that the patient is not imminent risk to self-harm, does not meet criteria for inpatient psych   Discharge Exam:   Blood pressure (!) 146/76, pulse 72, temperature 98.5 F (36.9 C), temperature source Oral, resp. rate 16, height '6\' 1"'  (1.854 m), weight 101.6 kg (224 lb), SpO2 100 %.  Heart:  S1 S2 present without murmurs  Abdomen:  Soft, mild discomfort with palpation periumbilically and nondistended. No masses, hepatosplenomegaly or hernias noted. Normal bowel sounds, without guarding, and without rebound.   Rectal:  Deferred  Msk:  Symmetrical without gross deformities. Normal posture. Extremities:  Without clubbing or edema. Neurologic:  Alert and  oriented x4;  grossly normal neurologically. Psych:  Alert and cooperative. Normal mood and affect.    Follow-up Information    Chevis Pretty, FNP. Call.   Specialty:  Family Medicine Why:  Hospital follow-up in 3-5 days, check CMP, magnesium, follow-up on inpatient labs Contact information: [place] [address] [address] [place] Follow up.   Why:  home health RN, PT Contact information: [id number] [place]       Mahala Menghini, PA-C Follow up on 10/16/2017.   Specialty:  Gastroenterology Why:  at 10:30 am Contact information: [address]  [place] [address] [phone]           Signed: Reyne Dumas 08/29/2017, 2:31 PM        Time spent >1 hour

## 2017-08-29 NOTE — Addendum Note (Signed)
Addendum  created 08/29/17 0808 by Vista Deck, CRNA   Sign clinical note

## 2017-08-29 NOTE — Progress Notes (Signed)
Pt discharged in stable condition via wheelchair into the care of his neighbor via private vehicle.  PIV removed intact w/o S&S of complications.  Discharge instructions reviewed with pt.  Pt verbalized understanding.

## 2017-08-30 DIAGNOSIS — Z87891 Personal history of nicotine dependence: Secondary | ICD-10-CM | POA: Diagnosis not present

## 2017-08-30 DIAGNOSIS — Z87440 Personal history of urinary (tract) infections: Secondary | ICD-10-CM | POA: Diagnosis not present

## 2017-08-30 DIAGNOSIS — K6289 Other specified diseases of anus and rectum: Secondary | ICD-10-CM | POA: Diagnosis not present

## 2017-08-30 DIAGNOSIS — I10 Essential (primary) hypertension: Secondary | ICD-10-CM | POA: Diagnosis not present

## 2017-08-30 DIAGNOSIS — M6281 Muscle weakness (generalized): Secondary | ICD-10-CM | POA: Diagnosis not present

## 2017-08-30 DIAGNOSIS — K5900 Constipation, unspecified: Secondary | ICD-10-CM | POA: Diagnosis not present

## 2017-08-30 DIAGNOSIS — E871 Hypo-osmolality and hyponatremia: Secondary | ICD-10-CM | POA: Diagnosis not present

## 2017-08-31 ENCOUNTER — Encounter: Payer: Self-pay | Admitting: Internal Medicine

## 2017-08-31 DIAGNOSIS — R109 Unspecified abdominal pain: Secondary | ICD-10-CM | POA: Diagnosis not present

## 2017-08-31 DIAGNOSIS — F17200 Nicotine dependence, unspecified, uncomplicated: Secondary | ICD-10-CM | POA: Diagnosis not present

## 2017-08-31 DIAGNOSIS — E871 Hypo-osmolality and hyponatremia: Secondary | ICD-10-CM | POA: Diagnosis not present

## 2017-08-31 DIAGNOSIS — K5900 Constipation, unspecified: Secondary | ICD-10-CM | POA: Diagnosis not present

## 2017-08-31 DIAGNOSIS — R1084 Generalized abdominal pain: Secondary | ICD-10-CM | POA: Diagnosis not present

## 2017-08-31 DIAGNOSIS — R030 Elevated blood-pressure reading, without diagnosis of hypertension: Secondary | ICD-10-CM | POA: Diagnosis not present

## 2017-09-03 DIAGNOSIS — K6289 Other specified diseases of anus and rectum: Secondary | ICD-10-CM | POA: Diagnosis not present

## 2017-09-03 DIAGNOSIS — M6281 Muscle weakness (generalized): Secondary | ICD-10-CM | POA: Diagnosis not present

## 2017-09-03 DIAGNOSIS — Z87440 Personal history of urinary (tract) infections: Secondary | ICD-10-CM | POA: Diagnosis not present

## 2017-09-03 DIAGNOSIS — I10 Essential (primary) hypertension: Secondary | ICD-10-CM | POA: Diagnosis not present

## 2017-09-03 DIAGNOSIS — E871 Hypo-osmolality and hyponatremia: Secondary | ICD-10-CM | POA: Diagnosis not present

## 2017-09-03 DIAGNOSIS — Z87891 Personal history of nicotine dependence: Secondary | ICD-10-CM | POA: Diagnosis not present

## 2017-09-03 DIAGNOSIS — K5900 Constipation, unspecified: Secondary | ICD-10-CM | POA: Diagnosis not present

## 2017-09-05 DIAGNOSIS — K6289 Other specified diseases of anus and rectum: Secondary | ICD-10-CM | POA: Diagnosis not present

## 2017-09-05 DIAGNOSIS — E871 Hypo-osmolality and hyponatremia: Secondary | ICD-10-CM | POA: Diagnosis not present

## 2017-09-05 DIAGNOSIS — I10 Essential (primary) hypertension: Secondary | ICD-10-CM | POA: Diagnosis not present

## 2017-09-05 DIAGNOSIS — Z87891 Personal history of nicotine dependence: Secondary | ICD-10-CM | POA: Diagnosis not present

## 2017-09-05 DIAGNOSIS — M6281 Muscle weakness (generalized): Secondary | ICD-10-CM | POA: Diagnosis not present

## 2017-09-05 DIAGNOSIS — K5900 Constipation, unspecified: Secondary | ICD-10-CM | POA: Diagnosis not present

## 2017-09-05 DIAGNOSIS — Z87440 Personal history of urinary (tract) infections: Secondary | ICD-10-CM | POA: Diagnosis not present

## 2017-09-09 DIAGNOSIS — K5900 Constipation, unspecified: Secondary | ICD-10-CM | POA: Diagnosis not present

## 2017-09-09 DIAGNOSIS — I10 Essential (primary) hypertension: Secondary | ICD-10-CM | POA: Diagnosis not present

## 2017-09-09 DIAGNOSIS — Z87891 Personal history of nicotine dependence: Secondary | ICD-10-CM | POA: Diagnosis not present

## 2017-09-09 DIAGNOSIS — M6281 Muscle weakness (generalized): Secondary | ICD-10-CM | POA: Diagnosis not present

## 2017-09-09 DIAGNOSIS — K6289 Other specified diseases of anus and rectum: Secondary | ICD-10-CM | POA: Diagnosis not present

## 2017-09-09 DIAGNOSIS — E871 Hypo-osmolality and hyponatremia: Secondary | ICD-10-CM | POA: Diagnosis not present

## 2017-09-09 DIAGNOSIS — Z87440 Personal history of urinary (tract) infections: Secondary | ICD-10-CM | POA: Diagnosis not present

## 2017-09-10 DIAGNOSIS — M6281 Muscle weakness (generalized): Secondary | ICD-10-CM | POA: Diagnosis not present

## 2017-09-10 DIAGNOSIS — E871 Hypo-osmolality and hyponatremia: Secondary | ICD-10-CM | POA: Diagnosis not present

## 2017-09-10 DIAGNOSIS — Z87891 Personal history of nicotine dependence: Secondary | ICD-10-CM | POA: Diagnosis not present

## 2017-09-10 DIAGNOSIS — I10 Essential (primary) hypertension: Secondary | ICD-10-CM | POA: Diagnosis not present

## 2017-09-10 DIAGNOSIS — Z87440 Personal history of urinary (tract) infections: Secondary | ICD-10-CM | POA: Diagnosis not present

## 2017-09-10 DIAGNOSIS — K6289 Other specified diseases of anus and rectum: Secondary | ICD-10-CM | POA: Diagnosis not present

## 2017-09-10 DIAGNOSIS — K5900 Constipation, unspecified: Secondary | ICD-10-CM | POA: Diagnosis not present

## 2017-09-11 DIAGNOSIS — I10 Essential (primary) hypertension: Secondary | ICD-10-CM | POA: Diagnosis not present

## 2017-09-11 DIAGNOSIS — E871 Hypo-osmolality and hyponatremia: Secondary | ICD-10-CM | POA: Diagnosis not present

## 2017-09-11 DIAGNOSIS — Z87891 Personal history of nicotine dependence: Secondary | ICD-10-CM | POA: Diagnosis not present

## 2017-09-11 DIAGNOSIS — K6289 Other specified diseases of anus and rectum: Secondary | ICD-10-CM | POA: Diagnosis not present

## 2017-09-11 DIAGNOSIS — Z87440 Personal history of urinary (tract) infections: Secondary | ICD-10-CM | POA: Diagnosis not present

## 2017-09-11 DIAGNOSIS — K5900 Constipation, unspecified: Secondary | ICD-10-CM | POA: Diagnosis not present

## 2017-09-11 DIAGNOSIS — M6281 Muscle weakness (generalized): Secondary | ICD-10-CM | POA: Diagnosis not present

## 2017-09-17 DIAGNOSIS — I10 Essential (primary) hypertension: Secondary | ICD-10-CM | POA: Diagnosis not present

## 2017-09-17 DIAGNOSIS — M6281 Muscle weakness (generalized): Secondary | ICD-10-CM | POA: Diagnosis not present

## 2017-09-17 DIAGNOSIS — Z87440 Personal history of urinary (tract) infections: Secondary | ICD-10-CM | POA: Diagnosis not present

## 2017-09-17 DIAGNOSIS — Z87891 Personal history of nicotine dependence: Secondary | ICD-10-CM | POA: Diagnosis not present

## 2017-09-17 DIAGNOSIS — K6289 Other specified diseases of anus and rectum: Secondary | ICD-10-CM | POA: Diagnosis not present

## 2017-09-17 DIAGNOSIS — K5900 Constipation, unspecified: Secondary | ICD-10-CM | POA: Diagnosis not present

## 2017-09-17 DIAGNOSIS — E871 Hypo-osmolality and hyponatremia: Secondary | ICD-10-CM | POA: Diagnosis not present

## 2017-09-19 ENCOUNTER — Other Ambulatory Visit: Payer: Self-pay | Admitting: *Deleted

## 2017-09-19 DIAGNOSIS — K6289 Other specified diseases of anus and rectum: Secondary | ICD-10-CM | POA: Diagnosis not present

## 2017-09-19 DIAGNOSIS — E871 Hypo-osmolality and hyponatremia: Secondary | ICD-10-CM | POA: Diagnosis not present

## 2017-09-19 DIAGNOSIS — M6281 Muscle weakness (generalized): Secondary | ICD-10-CM | POA: Diagnosis not present

## 2017-09-19 DIAGNOSIS — G4700 Insomnia, unspecified: Secondary | ICD-10-CM

## 2017-09-19 DIAGNOSIS — K5900 Constipation, unspecified: Secondary | ICD-10-CM | POA: Diagnosis not present

## 2017-09-19 DIAGNOSIS — Z87891 Personal history of nicotine dependence: Secondary | ICD-10-CM | POA: Diagnosis not present

## 2017-09-19 DIAGNOSIS — I10 Essential (primary) hypertension: Secondary | ICD-10-CM | POA: Diagnosis not present

## 2017-09-19 DIAGNOSIS — Z87440 Personal history of urinary (tract) infections: Secondary | ICD-10-CM | POA: Diagnosis not present

## 2017-09-19 MED ORDER — ALPRAZOLAM 0.25 MG PO TABS: ORAL_TABLET | ORAL | 1 refills | Status: AC

## 2017-09-20 NOTE — Telephone Encounter (Signed)
Rx phoned into [facility] mail

## 2017-09-25 ENCOUNTER — Inpatient Hospital Stay (HOSPITAL_COMMUNITY)
Admission: EM | Admit: 2017-09-25 | Discharge: 2017-09-27 | DRG: 071 | Disposition: A | Discharge status: Home Health | Payer: Medicare HMO | Attending: Internal Medicine | Admitting: Internal Medicine

## 2017-09-25 ENCOUNTER — Emergency Department (HOSPITAL_COMMUNITY): Payer: Medicare HMO

## 2017-09-25 ENCOUNTER — Inpatient Hospital Stay (HOSPITAL_COMMUNITY): Payer: Medicare HMO

## 2017-09-25 ENCOUNTER — Encounter (HOSPITAL_COMMUNITY): Payer: Self-pay | Admitting: Emergency Medicine

## 2017-09-25 DIAGNOSIS — F449 Dissociative and conversion disorder, unspecified: Secondary | ICD-10-CM | POA: Diagnosis present

## 2017-09-25 DIAGNOSIS — Z23 Encounter for immunization: Secondary | ICD-10-CM

## 2017-09-25 DIAGNOSIS — Z66 Do not resuscitate: Secondary | ICD-10-CM | POA: Diagnosis present

## 2017-09-25 DIAGNOSIS — L409 Psoriasis, unspecified: Secondary | ICD-10-CM | POA: Diagnosis present

## 2017-09-25 DIAGNOSIS — F319 Bipolar disorder, unspecified: Secondary | ICD-10-CM | POA: Diagnosis not present

## 2017-09-25 DIAGNOSIS — K6289 Other specified diseases of anus and rectum: Secondary | ICD-10-CM | POA: Diagnosis not present

## 2017-09-25 DIAGNOSIS — R441 Visual hallucinations: Secondary | ICD-10-CM | POA: Diagnosis present

## 2017-09-25 DIAGNOSIS — Z86010 Personal history of colonic polyps: Secondary | ICD-10-CM

## 2017-09-25 DIAGNOSIS — R109 Unspecified abdominal pain: Secondary | ICD-10-CM | POA: Diagnosis present

## 2017-09-25 DIAGNOSIS — F17210 Nicotine dependence, cigarettes, uncomplicated: Secondary | ICD-10-CM | POA: Diagnosis present

## 2017-09-25 DIAGNOSIS — Z8673 Personal history of transient ischemic attack (TIA), and cerebral infarction without residual deficits: Secondary | ICD-10-CM

## 2017-09-25 DIAGNOSIS — R4182 Altered mental status, unspecified: Secondary | ICD-10-CM | POA: Diagnosis not present

## 2017-09-25 DIAGNOSIS — Z9119 Patient's noncompliance with other medical treatment and regimen: Secondary | ICD-10-CM | POA: Diagnosis not present

## 2017-09-25 DIAGNOSIS — R404 Transient alteration of awareness: Secondary | ICD-10-CM | POA: Diagnosis not present

## 2017-09-25 DIAGNOSIS — F0281 Dementia in other diseases classified elsewhere with behavioral disturbance: Secondary | ICD-10-CM | POA: Diagnosis not present

## 2017-09-25 DIAGNOSIS — G9340 Encephalopathy, unspecified: Principal | ICD-10-CM | POA: Diagnosis present

## 2017-09-25 DIAGNOSIS — Z87891 Personal history of nicotine dependence: Secondary | ICD-10-CM | POA: Diagnosis not present

## 2017-09-25 DIAGNOSIS — E538 Deficiency of other specified B group vitamins: Secondary | ICD-10-CM | POA: Diagnosis present

## 2017-09-25 DIAGNOSIS — G3183 Dementia with Lewy bodies: Secondary | ICD-10-CM | POA: Diagnosis present

## 2017-09-25 DIAGNOSIS — F419 Anxiety disorder, unspecified: Secondary | ICD-10-CM | POA: Diagnosis present

## 2017-09-25 DIAGNOSIS — I10 Essential (primary) hypertension: Secondary | ICD-10-CM | POA: Diagnosis not present

## 2017-09-25 DIAGNOSIS — R440 Auditory hallucinations: Secondary | ICD-10-CM

## 2017-09-25 DIAGNOSIS — M109 Gout, unspecified: Secondary | ICD-10-CM | POA: Diagnosis present

## 2017-09-25 DIAGNOSIS — M4856XA Collapsed vertebra, not elsewhere classified, lumbar region, initial encounter for fracture: Secondary | ICD-10-CM | POA: Diagnosis present

## 2017-09-25 DIAGNOSIS — R269 Unspecified abnormalities of gait and mobility: Secondary | ICD-10-CM | POA: Diagnosis not present

## 2017-09-25 DIAGNOSIS — G20 Parkinson's disease: Secondary | ICD-10-CM | POA: Diagnosis not present

## 2017-09-25 DIAGNOSIS — R410 Disorientation, unspecified: Secondary | ICD-10-CM | POA: Diagnosis not present

## 2017-09-25 DIAGNOSIS — R7989 Other specified abnormal findings of blood chemistry: Secondary | ICD-10-CM

## 2017-09-25 DIAGNOSIS — R778 Other specified abnormalities of plasma proteins: Secondary | ICD-10-CM

## 2017-09-25 DIAGNOSIS — E871 Hypo-osmolality and hyponatremia: Secondary | ICD-10-CM | POA: Diagnosis present

## 2017-09-25 DIAGNOSIS — E785 Hyperlipidemia, unspecified: Secondary | ICD-10-CM | POA: Diagnosis present

## 2017-09-25 DIAGNOSIS — Z881 Allergy status to other antibiotic agents status: Secondary | ICD-10-CM | POA: Diagnosis not present

## 2017-09-25 DIAGNOSIS — M6281 Muscle weakness (generalized): Secondary | ICD-10-CM | POA: Diagnosis not present

## 2017-09-25 DIAGNOSIS — R531 Weakness: Secondary | ICD-10-CM | POA: Diagnosis not present

## 2017-09-25 DIAGNOSIS — R748 Abnormal levels of other serum enzymes: Secondary | ICD-10-CM | POA: Diagnosis not present

## 2017-09-25 DIAGNOSIS — K5900 Constipation, unspecified: Secondary | ICD-10-CM | POA: Diagnosis not present

## 2017-09-25 DIAGNOSIS — I248 Other forms of acute ischemic heart disease: Secondary | ICD-10-CM | POA: Diagnosis present

## 2017-09-25 DIAGNOSIS — Z87440 Personal history of urinary (tract) infections: Secondary | ICD-10-CM | POA: Diagnosis not present

## 2017-09-25 LAB — CBC WITH DIFFERENTIAL/PLATELET
BASOS PCT: 0 %
Basophils Absolute: 0 10*3/uL (ref 0.0–0.1)
Eosinophils Absolute: 0.1 10*3/uL (ref 0.0–0.7)
Eosinophils Relative: 1 %
HEMATOCRIT: 43.2 % (ref 39.0–52.0)
Hemoglobin: 15.5 g/dL (ref 13.0–17.0)
Lymphocytes Relative: 13 %
Lymphs Abs: 1.1 10*3/uL (ref 0.7–4.0)
MCH: 32.7 pg (ref 26.0–34.0)
MCHC: 35.9 g/dL (ref 30.0–36.0)
MCV: 91.1 fL (ref 78.0–100.0)
MONO ABS: 0.7 10*3/uL (ref 0.1–1.0)
MONOS PCT: 9 %
NEUTROS ABS: 6 10*3/uL (ref 1.7–7.7)
Neutrophils Relative %: 77 %
Platelets: 217 10*3/uL (ref 150–400)
RBC: 4.74 MIL/uL (ref 4.22–5.81)
RDW: 11.6 % (ref 11.5–15.5)
WBC: 7.9 10*3/uL (ref 4.0–10.5)

## 2017-09-25 LAB — COMPREHENSIVE METABOLIC PANEL
ALBUMIN: 4 g/dL (ref 3.5–5.0)
ALT: 31 U/L (ref 17–63)
ANION GAP: 12 (ref 5–15)
AST: 33 U/L (ref 15–41)
Alkaline Phosphatase: 64 U/L (ref 38–126)
BUN: 35 mg/dL — ABNORMAL HIGH (ref 6–20)
CHLORIDE: 93 mmol/L — AB (ref 101–111)
CO2: 26 mmol/L (ref 22–32)
CREATININE: 1.11 mg/dL (ref 0.61–1.24)
Calcium: 9.5 mg/dL (ref 8.9–10.3)
GFR calc non Af Amer: >60 mL/min (ref >60)
GLUCOSE: 103 mg/dL — AB (ref 65–99)
Potassium: 3.6 mmol/L (ref 3.5–5.1)
SODIUM: 131 mmol/L — AB (ref 135–145)
Total Bilirubin: 1.4 mg/dL — ABNORMAL HIGH (ref 0.3–1.2)
Total Protein: 7.5 g/dL (ref 6.5–8.1)

## 2017-09-25 LAB — ETHANOL

## 2017-09-25 LAB — URINALYSIS, ROUTINE W REFLEX MICROSCOPIC
Bilirubin Urine: NEGATIVE
GLUCOSE, UA: NEGATIVE mg/dL
HGB URINE DIPSTICK: NEGATIVE
KETONES UR: 5 mg/dL — AB
Leukocytes, UA: NEGATIVE
Nitrite: NEGATIVE
Protein, ur: NEGATIVE mg/dL
Specific Gravity, Urine: 1.018 (ref 1.005–1.030)
pH: 5 (ref 5.0–8.0)

## 2017-09-25 LAB — TROPONIN I
Troponin I: 0.04 ng/mL (ref <0.03)
Troponin I: 0.04 ng/mL (ref <0.03)

## 2017-09-25 LAB — RAPID URINE DRUG SCREEN, HOSP PERFORMED
Amphetamines: NOT DETECTED
BARBITURATES: NOT DETECTED
BENZODIAZEPINES: NOT DETECTED
COCAINE: NOT DETECTED
Opiates: NOT DETECTED
TETRAHYDROCANNABINOL: NOT DETECTED

## 2017-09-25 LAB — APTT: APTT: 32 s (ref 24–36)

## 2017-09-25 LAB — AMMONIA: Ammonia: 10 umol/L (ref 9–35)

## 2017-09-25 LAB — PROTIME-INR
INR: 1
Prothrombin Time: 13.1 seconds (ref 11.4–15.2)

## 2017-09-25 MED ORDER — SENNOSIDES-DOCUSATE SODIUM 8.6-50 MG PO TABS: 1.0000 | ORAL_TABLET | Freq: Every evening | ORAL | Status: DC | PRN

## 2017-09-25 MED ORDER — ACETAMINOPHEN 650 MG RE SUPP: 650.0000 mg | RECTAL | Status: DC | PRN

## 2017-09-25 MED ORDER — ALPRAZOLAM 0.25 MG PO TABS
0.2500 mg | ORAL_TABLET | Freq: Every day | ORAL | Status: DC
  Administered 2017-09-25 – 2017-09-26 (×2): 0.25 mg via ORAL
  Filled 2017-09-25 (×2): qty 1

## 2017-09-25 MED ORDER — INFLUENZA VAC SPLIT HIGH-DOSE 0.5 ML IM SUSY
0.5000 mL | PREFILLED_SYRINGE | INTRAMUSCULAR | Status: AC
  Administered 2017-09-26: 0.5 mL via INTRAMUSCULAR
  Filled 2017-09-25: qty 0.5

## 2017-09-25 MED ORDER — ENOXAPARIN SODIUM 40 MG/0.4ML ~~LOC~~ SOLN
40.0000 mg | SUBCUTANEOUS | Status: DC
  Administered 2017-09-25 – 2017-09-26 (×2): 40 mg via SUBCUTANEOUS
  Filled 2017-09-25 (×3): qty 0.4

## 2017-09-25 MED ORDER — FOLIC ACID 1 MG PO TABS
1.0000 mg | ORAL_TABLET | Freq: Every day | ORAL | Status: DC
  Administered 2017-09-26 – 2017-09-27 (×2): 1 mg via ORAL
  Filled 2017-09-25 (×2): qty 1

## 2017-09-25 MED ORDER — SODIUM CHLORIDE 0.9 % IV SOLN
100.0000 mL/h | INTRAVENOUS | Status: DC
  Administered 2017-09-25: 100 mL/h via INTRAVENOUS

## 2017-09-25 MED ORDER — SODIUM CHLORIDE 0.9 % IV BOLUS (SEPSIS)
1000.0000 mL | Freq: Once | INTRAVENOUS | Status: AC
  Administered 2017-09-25: 1000 mL via INTRAVENOUS

## 2017-09-25 MED ORDER — SODIUM CHLORIDE 0.9 % IV BOLUS (SEPSIS)
500.0000 mL | Freq: Once | INTRAVENOUS | Status: AC
  Administered 2017-09-25: 500 mL via INTRAVENOUS

## 2017-09-25 MED ORDER — LISINOPRIL 10 MG PO TABS
20.0000 mg | ORAL_TABLET | Freq: Every day | ORAL | Status: DC
  Filled 2017-09-25: qty 2

## 2017-09-25 MED ORDER — ACETAMINOPHEN 325 MG PO TABS: 650.0000 mg | ORAL_TABLET | ORAL | Status: DC | PRN

## 2017-09-25 MED ORDER — MAGNESIUM OXIDE 400 (241.3 MG) MG PO TABS
400.0000 mg | ORAL_TABLET | Freq: Every day | ORAL | Status: DC
  Administered 2017-09-26 – 2017-09-27 (×2): 400 mg via ORAL
  Filled 2017-09-25 (×2): qty 1

## 2017-09-25 MED ORDER — ACETAMINOPHEN 160 MG/5ML PO SOLN
650.0000 mg | ORAL | Status: DC | PRN
  Filled 2017-09-25: qty 20.3

## 2017-09-25 MED ORDER — PRAVASTATIN SODIUM 10 MG PO TABS
20.0000 mg | ORAL_TABLET | Freq: Every day | ORAL | Status: DC
  Administered 2017-09-25 – 2017-09-26 (×2): 20 mg via ORAL
  Filled 2017-09-25 (×2): qty 2

## 2017-09-25 MED ORDER — VITAMIN B-1 100 MG PO TABS
100.0000 mg | ORAL_TABLET | Freq: Every day | ORAL | Status: DC
  Administered 2017-09-26 – 2017-09-27 (×2): 100 mg via ORAL
  Filled 2017-09-25 (×2): qty 1

## 2017-09-25 MED ORDER — METOPROLOL SUCCINATE ER 50 MG PO TB24
50.0000 mg | ORAL_TABLET | Freq: Every day | ORAL | Status: DC
  Administered 2017-09-25: 50 mg via ORAL
  Filled 2017-09-25 (×2): qty 1

## 2017-09-25 MED ORDER — IOPAMIDOL (ISOVUE-300) INJECTION 61%
100.0000 mL | Freq: Once | INTRAVENOUS | Status: AC | PRN
  Administered 2017-09-25: 100 mL via INTRAVENOUS

## 2017-09-25 MED ORDER — ALLOPURINOL 100 MG PO TABS
100.0000 mg | ORAL_TABLET | Freq: Every day | ORAL | Status: DC
  Administered 2017-09-26 – 2017-09-27 (×2): 100 mg via ORAL
  Filled 2017-09-25 (×2): qty 1

## 2017-09-25 MED ORDER — ASPIRIN 325 MG PO TABS
325.0000 mg | ORAL_TABLET | Freq: Every day | ORAL | Status: DC
  Administered 2017-09-25 – 2017-09-27 (×3): 325 mg via ORAL
  Filled 2017-09-25 (×3): qty 1

## 2017-09-25 MED ORDER — STROKE: EARLY STAGES OF RECOVERY BOOK
Freq: Once | Status: AC
  Administered 2017-09-26: 08:00:00
  Filled 2017-09-25: qty 1

## 2017-09-25 MED ORDER — SODIUM CHLORIDE 0.9 % IV SOLN
INTRAVENOUS | Status: DC
  Administered 2017-09-25: 21:00:00 via INTRAVENOUS

## 2017-09-25 MED ORDER — ASPIRIN 300 MG RE SUPP: 300.0000 mg | Freq: Every day | RECTAL | Status: DC

## 2017-09-25 MED ORDER — PANTOPRAZOLE SODIUM 40 MG PO TBEC
40.0000 mg | DELAYED_RELEASE_TABLET | Freq: Every day | ORAL | Status: DC
  Administered 2017-09-26 – 2017-09-27 (×2): 40 mg via ORAL
  Filled 2017-09-25 (×2): qty 1

## 2017-09-25 MED ORDER — LORAZEPAM 2 MG/ML IJ SOLN
1.0000 mg | Freq: Once | INTRAMUSCULAR | Status: AC
  Administered 2017-09-25: 1 mg via INTRAVENOUS
  Filled 2017-09-25: qty 1

## 2017-09-25 NOTE — ED Notes (Signed)
CRITICAL VALUE ALERT  Critical Value:  Troponin 0.04  Date & Time Notied:  09/25/17 1617  Provider Notified:Dr. Tomi Bamberger  Orders Received/Actions taken:

## 2017-09-25 NOTE — H&P (Signed)
History and Physical    Daniel Barnett KGU:[id number] DOB: 1946/11/30 DOA: 09/25/2017  PCP: Chevis Pretty, FNP Consultants:  None Patient coming from:  Home - lives with a friend; NOK:  Daughter, [phone]  Chief Complaint: Encephalopathy  HPI: Daniel Barnett is a 71 y.o. male with medical history significant of HTN, HLD, bipolar and recent admission for proctitis wtih prolonged admission from 9/7-13 due to psychiatric overlay presenting with AMS.  Initially the patient was unaccompanied and was able to contribute very little history in one word answers to questions.  "Sick."  Head spinning.  All night.  A little bit normal yesterday.  +headache.  +visual changes.  +dysphagia.   His daughter and friend then returned and were able to provide a much more substantial history.  He went to the ER a few weeks ago, admitted to 3rd floor.  Stayed for about a week, Dr. Allyson Sabal was caring for him.  He was on suicide watch, h/o SI.  They were looking for release to a geriatric psych unit.  He eventually convinced [facility] to let him go home.  Had a friend move in with him.  Complaining of being swimmy-headed and dizzy.  He was mad about the suicide watch and he got mad about plastic cutlery.  He became irate.  They rescinded the suicide watch and he got mad that he didn't have the sitter to keep him company.  He was complaining about pus coming out of his penis - he had a very bad intestinal infection and the pus was coming out of his anus instead.  "Severe UTI making him crazy and out of his head."  Since he got home, "he has run amok".  He is choosing which medications to take.  Started refusing some of his medications.  He was completely clear headed on Friday.  Saturday he was talking out of his head.  By Sunday night, he called his daughter (she lives out of state) and sounded like a stroke victim.  His daughter couldn't even understand him.  The neighbor said there was something clearly wrong -  mumbling.  He refused to go home, "It's not safe.  People are trying to get me."  At 10 pm, he was laying in the laundry room floor holding the door closed to keep imagined people out.  His daughter came to check on him.  20-22 years ago, he was psychotic.  He was sent to [facility] for 30-45 days.  He discontinued his medicines after 4-5 months but he was never the same man.  He is a hermit, no tolerance for noise, no kids, no dog, no talking.  He took Xanax for years.  Xanax was stopped during his last hospitalization. He ran out about 3-4 days ago.  ?withdrawal.  He was worried last night he couldn't go to sleep.  Family is now concerned about whether they made a right choice in letting the hospital send him home.  He has a friend who waits on him hand and foot and he barely moves.     Yesterday he was able to get up, sit in the chair, talk, told daughter "don't holler at me, well you talk too loud."  She asked him questions, "you're not gonna question me."  He was clearly paranoid.  Daughter was threatening last night that he would either need to take medicine for his bipolar or come back to the hospital.  When the PT called yesterday to say she was coming, "I'm not  going to feel like her coming.  Well I'm not doing it."  Today, refused to get out of bed.  Wouldn't get up, wouldn't get dressed.  When PT arrived he was flickering his eyes, refusing to talk, also urinated on himself.  When EMS arrived, they thought it was factitious.  It is very coincidental that he woulnd't do anything with PT or make it to any appts today when he said yesterday that he wouldn't.  He has been increasingly non-compliant since he came home from the hospital.  Refusing to do more and more.  Shortly after coming home, he was taken to [facility] after refusing to [place].  He stayed overnight and was sent home again.  Last week, he had a manic episode (previously he spent $120,000 in a week with mania); he bought a treadmill  (despite not even being able to talk with a walker) and was talking about a new recliner (his is 30 months old), etc.  ED Course:  1L bolus  Review of Systems: Unable to perform   Ambulatory Status:   Ambulates without assistance  Past Medical History:  Diagnosis Date  . Anxiety   . Gout   . Hyperlipidemia   . Hypertension   . Psoriasis     Past Surgical History:  Procedure Laterality Date  . COLONOSCOPY WITH PROPOFOL N/A 08/28/2017   Procedure: COLONOSCOPY WITH PROPOFOL;  Surgeon: Daneil Dolin, MD;  Location: [facility];  Service: Gastroenterology;  Laterality: N/A;  . POLYPECTOMY  08/28/2017   Procedure: POLYPECTOMY;  Surgeon: Daneil Dolin, MD;  Location: [facility];  Service: Gastroenterology;;  polyp at ascending colon x2, descending colon polyp  . TONSILLECTOMY    . VASECTOMY      Social History   Social History  . Marital status: Divorced    Spouse name: N/A  . Number of children: N/A  . Years of education: N/A   Occupational History  . Not on file.   Social History Main Topics  . Smoking status: Current Every Day Smoker    Types: Cigarettes  . Smokeless tobacco: Never Used     Comment: 1/2 to 1 pack a day   . Alcohol use Yes     Comment: Four 12 ounces of beer a day  . Drug use: No  . Sexual activity: Not on file   Other Topics Concern  . Not on file   Social History Narrative  . No narrative on file    Allergies  Allergen Reactions  . Ciprofloxacin     Family History  Problem Relation Age of Onset  . Colon cancer Neg Hx   . Colon polyps Neg Hx     Prior to Admission medications   Medication Sig Start Date End Date Taking? Authorizing Provider  allopurinol (ZYLOPRIM) 100 MG tablet Take 1 tablet (100 mg total) by mouth daily. 12/25/16   Chevis Pretty, FNP  ALPRAZolam Duanne Moron) 0.25 MG tablet TAKE 1 TABLET AT BEDTIME  FOR  ANXIETY 09/19/17   Evelina Dun A, FNP  folic acid (FOLVITE) 614 MCG tablet Take 1 tablet (400 mcg total)  by mouth daily. 08/29/17 08/29/18  Reyne Dumas, MD  lisinopril (PRINIVIL,ZESTRIL) 20 MG tablet Take 1 tablet (20 mg total) by mouth daily. 08/28/17 08/28/18  Reyne Dumas, MD  magnesium oxide (MAG-OX) 400 (241.3 Mg) MG tablet Take 1 tablet (400 mg total) by mouth daily. 08/28/17   Reyne Dumas, MD  metoprolol succinate (TOPROL-XL) 50 MG 24 hr tablet TAKE  1 TABLET DAILY WITH OR IMMEDIATELY FOLLOWING A MEAL 05/22/17   Chipper Herb, MD  nitrofurantoin, macrocrystal-monohydrate, (MACROBID) 100 MG capsule Take 1 capsule by mouth 2 (two) times daily. 08/21/17   [provider]  pantoprazole (PROTONIX) 40 MG tablet Take 1 tablet (40 mg total) by mouth daily. 08/28/17   Reyne Dumas, MD  polyethylene glycol (MIRALAX / GLYCOLAX) packet Take 17 g by mouth daily. 08/30/17   Reyne Dumas, MD  pravastatin (PRAVACHOL) 20 MG tablet TAKE 1 TABLET EVERY DAY 05/22/17   Chipper Herb, MD  thiamine 100 MG tablet Take 1 tablet (100 mg total) by mouth daily. 08/29/17   Reyne Dumas, MD    Physical Exam: Vitals:   09/25/17 1834 09/25/17 1900 09/25/17 2032 09/25/17 2100  BP: (!) 154/98 (!) 156/90  (!) 141/80  Pulse: 91 95  94  Resp: 18 (!) 34  (!) 28  Temp:    98.4 F (36.9 C)  TempSrc:   Oral Oral  SpO2: 98% 98%  100%  Weight:   88.9 kg (195 lb 14.4 oz)   Height:   6\' 2"  (1.88 m)      General: Clearly abnormal.  He is lying still on his back with his eyes rolled back.  He is slow to speak and interact.  Yet when I mentioned conversion d/o and a psychiatric overlay, he immediately looked at me and very clearly asked me "What?" Eyes:  PERRL, EOMI, normal lids, iris; mildly injected conjunctivae ENT:  grossly normal hearing, lips & tongue, somewhat dry mm; poordentition Neck:  no LAD, masses or thyromegaly; no carotid bruits Cardiovascular:  RRR, no m/r/g. No LE edema.  Respiratory:   CTA bilaterally with no wheezes/rales/rhonchi.  Normal respiratory effort. Abdomen:  soft, diffusely TTP, ND,  NABS Skin:  no rash or induration seen on limited exam Musculoskeletal:  grossly normal tone BUE/BLE, good ROM, no bony abnormality Psychiatric:  As above - clearly altered.  Poor eye contact, eyes are rolled back throughout with very sporadic good eye contact.  Halting speech. Neurologic: Unable to perform    Radiological Exams on Admission: Ct Head Wo Contrast  Result Date: 09/25/2017 CLINICAL DATA:  Altered mental status today. EXAM: CT HEAD WITHOUT CONTRAST TECHNIQUE: Contiguous axial images were obtained from the base of the skull through the vertex without intravenous contrast. COMPARISON:  None. FINDINGS: Brain: Age related cerebral atrophy, ventriculomegaly and periventricular white matter disease. No CT findings for acute hemispheric infarction or intracranial hemorrhage. No extra-axial fluid collections. No mass lesions. The brainstem and cerebellum are grossly normal. Vascular: No hyperdense vessel or unexpected calcification. Skull: No skull fracture or bone lesions. Sinuses/Orbits: The paranasal sinuses and mastoid air cells are clear. The globes are intact. Other: No scalp lesions or hematoma. IMPRESSION: Mild age related changes but no acute process or mass lesion. Electronically Signed   By: Marijo Sanes M.D.   On: 09/25/2017 15:24   Ct Abdomen Pelvis W Contrast  Result Date: 09/25/2017 CLINICAL DATA:  Encephalopathy. The patient grimaced with abdominal palpation. EXAM: CT ABDOMEN AND PELVIS WITH CONTRAST TECHNIQUE: Multidetector CT imaging of the abdomen and pelvis was performed using the standard protocol following bolus administration of intravenous contrast. CONTRAST:  165mL ISOVUE-300 IOPAMIDOL (ISOVUE-300) INJECTION 61% COMPARISON:  CT of the abdomen and pelvis 08/31/2017 FINDINGS: Lower chest: The lung bases are clear without focal nodule, mass, or airspace disease. The heart size is normal. Coronary artery calcifications are again seen. No significant pleural or pericardial  effusion  is present. Hepatobiliary: No focal liver abnormality is seen. No gallstones, gallbladder wall thickening, or biliary dilatation. Pancreas: Unremarkable. No pancreatic ductal dilatation or surrounding inflammatory changes. Spleen: Normal in size without focal abnormality. Adrenals/Urinary Tract: Adrenal glands are normal bilaterally. The kidneys and ureters are within normal limits bilaterally. A left-sided bladder diverticulum is noted without focal stone or inflammatory change. The urinary bladder is otherwise normal. Stomach/Bowel: The stomach and duodenum are within normal limits. Small bowel is unremarkable. The appendix is visualized and normal. The ascending and transverse colon are within normal limits. The descending and sigmoid colon are normal. Vascular/Lymphatic: Atherosclerotic calcifications are present in the aorta and branch vessels without aneurysm. No significant adenopathy is present. Reproductive: Prostate is mildly enlarged, measuring 5 cm in transverse diameter. Penile calcifications are again seen. Other: No abdominal wall hernia or abnormality. No abdominopelvic ascites. Musculoskeletal: Remote L1 superior endplate fracture is present. Bilateral L5 pars defects are noted. Grade 1 anterolisthesis is present at L5-S1. The pelvis is intact. The hips are located and within normal limits bilaterally. IMPRESSION: 1. No acute or focal abnormality to explain abdominal pain. 2.  Aortic Atherosclerosis (ICD10-I70.0). 3. Remote L1 compression fracture. 4. Bilateral L5 pars defects. Electronically Signed   By: San Morelle M.D.   On: 09/25/2017 20:52   Dg Chest Portable 1 View  Result Date: 09/25/2017 CLINICAL DATA:  71 year old male with history of weakness. EXAM: PORTABLE CHEST 1 VIEW COMPARISON:  Chest x-ray 08/23/2017. FINDINGS: Study is limited by lack of visualization of the lung bases. With this limitation in mind lung volumes are normal. No consolidative airspace disease. No  definite pleural effusions (small pleural effusions cannot be excluded secondary to exclusion of the lung bases). No pneumothorax. No pulmonary nodule or mass noted. Pulmonary vasculature and the cardiomediastinal silhouette are within normal limits. IMPRESSION: 1. No definite radiographic evidence of acute cardiopulmonary disease. Electronically Signed   By: Vinnie Langton M.D.   On: 09/25/2017 15:01    EKG: Independently reviewed.  Sinus tachycardia with rate 100; nonspecific ST changes with no evidence of acute ischemia   Labs on Admission: I have personally reviewed the available labs and imaging studies at the time of the admission.  Pertinent labs:   Na++ 131 - stable Chl 93 Glucose 103 BUN 35/Creatinine 1.11 Troponin 0.04 x 2 INR 1 ETOH negative UA: 5 ketones UDS negative    Assessment/Plan Principal Problem:   Encephalopathy Active Problems:   Essential hypertension, benign   Proctitis   Abdominal pain   Encephalopathy -While there are a number of possibilities here, underlying psychiatric illness with conversion d/o is at the very top of the list -Patient has a h/o psychiatric illness requiring long-term hospitalization -During his recent hospitalization, the plan was for discharge to geriatric psych facility but this changed -Patient with unusual neurologic presentation that does not seem wholly consistent with organic disease -He has become more withdrawn and less interactive as his friend has asked him to do more for himself -He reported yesterday that he would not perform PT today and then seemed to suddenly develop these symptoms when PT arrived -His symptoms are somewhat inconsistent -All that said, will order psych consult and sitter -Meanwhile, will also order neuro consult and MRI of the brain.  However, since suspicion for acute CVA is lower, will not actively transfer to [facility]. -Continue qhs Xanax. -Denies SI.  Abdominal pain -CT performed to assess for  alternative cause for symptoms -CT negative, essentially  Proctitis -Recent infection -Daughter  pulled me aside outside of the room to point out that his male roommate/friend/helper is not sexually involved with her father -Recent HIV test was negative -Infection appears to be healing, as the patient does not appear to be septic  HTN -Continue lisinopril and Toprol  DVT prophylaxis: Lovenox Code Status: DNR - confirmed with family Family Communication: Daughter and friend present throughout evaluation  Disposition Plan:  Home once clinically improved Consults called: Neurology; Psychiatry; SW; Nutrition; PT/OT Admission status: Admit - It is my clinical opinion that admission to INPATIENT is reasonable and necessary because this patient will require at least 2 midnights in the hospital to treat this condition based on the medical complexity of the problems presented.  Given the aforementioned information, the predictability of an adverse outcome is felt to be significant.    Karmen Bongo MD [facility]  If note is complete, please contact covering daytime or nighttime physician. [email or web address] Password [email or web address]  09/25/2017, 10:44 PM

## 2017-09-25 NOTE — ED Notes (Signed)
Refused to eat cracker

## 2017-09-25 NOTE — ED Triage Notes (Signed)
Pt has complaint of weakness for a couple of weeks. Pt reports having a headache. CBG per EMS 101 Family concerned about pt having possible stroke. Alert and oriented x4

## 2017-09-26 ENCOUNTER — Encounter (HOSPITAL_COMMUNITY): Payer: Self-pay | Admitting: Registered Nurse

## 2017-09-26 ENCOUNTER — Inpatient Hospital Stay (HOSPITAL_COMMUNITY): Payer: Medicare HMO

## 2017-09-26 ENCOUNTER — Inpatient Hospital Stay (HOSPITAL_COMMUNITY)
Admit: 2017-09-26 | Discharge: 2017-09-26 | Disposition: A | Discharge status: Home / Self Care | Payer: Medicare HMO | Attending: Neurology | Admitting: Neurology

## 2017-09-26 DIAGNOSIS — R748 Abnormal levels of other serum enzymes: Secondary | ICD-10-CM

## 2017-09-26 DIAGNOSIS — I10 Essential (primary) hypertension: Secondary | ICD-10-CM

## 2017-09-26 DIAGNOSIS — R109 Unspecified abdominal pain: Secondary | ICD-10-CM

## 2017-09-26 DIAGNOSIS — R7989 Other specified abnormal findings of blood chemistry: Secondary | ICD-10-CM

## 2017-09-26 DIAGNOSIS — R410 Disorientation, unspecified: Secondary | ICD-10-CM

## 2017-09-26 DIAGNOSIS — E871 Hypo-osmolality and hyponatremia: Secondary | ICD-10-CM

## 2017-09-26 DIAGNOSIS — R440 Auditory hallucinations: Secondary | ICD-10-CM

## 2017-09-26 DIAGNOSIS — R778 Other specified abnormalities of plasma proteins: Secondary | ICD-10-CM

## 2017-09-26 LAB — BASIC METABOLIC PANEL
ANION GAP: 9 (ref 5–15)
BUN: 29 mg/dL — ABNORMAL HIGH (ref 6–20)
CHLORIDE: 94 mmol/L — AB (ref 101–111)
CO2: 27 mmol/L (ref 22–32)
Calcium: 8.9 mg/dL (ref 8.9–10.3)
Creatinine, Ser: 1.05 mg/dL (ref 0.61–1.24)
GFR calc non Af Amer: >60 mL/min (ref >60)
Glucose, Bld: 83 mg/dL (ref 65–99)
POTASSIUM: 3.4 mmol/L — AB (ref 3.5–5.1)
SODIUM: 130 mmol/L — AB (ref 135–145)

## 2017-09-26 LAB — LIPID PANEL
Cholesterol: 153 mg/dL (ref 0–200)
HDL: 39 mg/dL — ABNORMAL LOW (ref >40)
LDL Cholesterol: 101 mg/dL — ABNORMAL HIGH (ref 0–99)
TRIGLYCERIDES: 67 mg/dL (ref <150)
Total CHOL/HDL Ratio: 3.9 RATIO
VLDL: 13 mg/dL (ref 0–40)

## 2017-09-26 LAB — TSH: TSH: 0.737 u[IU]/mL (ref 0.350–4.500)

## 2017-09-26 LAB — SEDIMENTATION RATE: Sed Rate: 32 mm/hr — ABNORMAL HIGH (ref 0–16)

## 2017-09-26 LAB — HEMOGLOBIN A1C
HEMOGLOBIN A1C: 5.2 % (ref 4.8–5.6)
MEAN PLASMA GLUCOSE: 102.54 mg/dL

## 2017-09-26 LAB — TROPONIN I
Troponin I: 0.03 ng/mL (ref <0.03)
Troponin I: 0.03 ng/mL (ref <0.03)

## 2017-09-26 LAB — C-REACTIVE PROTEIN: CRP: 0.9 mg/dL (ref <1.0)

## 2017-09-26 LAB — MAGNESIUM: Magnesium: 1.8 mg/dL (ref 1.7–2.4)

## 2017-09-26 MED ORDER — METOPROLOL SUCCINATE ER 25 MG PO TB24
25.0000 mg | ORAL_TABLET | Freq: Every day | ORAL | Status: DC
  Administered 2017-09-26 – 2017-09-27 (×2): 25 mg via ORAL
  Filled 2017-09-26: qty 1

## 2017-09-26 MED ORDER — CARBIDOPA-LEVODOPA 25-100 MG PO TABS
1.0000 | ORAL_TABLET | Freq: Two times a day (BID) | ORAL | Status: DC
  Administered 2017-09-26 – 2017-09-27 (×2): 1 via ORAL
  Filled 2017-09-26 (×2): qty 1

## 2017-09-26 MED ORDER — CYANOCOBALAMIN 1000 MCG/ML IJ SOLN: 1000.0000 ug | INTRAMUSCULAR | Status: DC

## 2017-09-26 MED ORDER — CYANOCOBALAMIN 1000 MCG/ML IJ SOLN
1000.0000 ug | Freq: Every day | INTRAMUSCULAR | Status: DC
  Administered 2017-09-26 – 2017-09-27 (×2): 1000 ug via INTRAMUSCULAR
  Filled 2017-09-26 (×2): qty 1

## 2017-09-26 NOTE — Progress Notes (Signed)
Offsite EEG completed at [facility].  Results pending. 

## 2017-09-26 NOTE — Evaluation (Signed)
Physical Therapy Evaluation Patient Details Name: Daniel Barnett MRN: [id number] DOB: April 05, 1946 Today's Date: 09/26/2017   History of Present Illness   Daniel Barnett is a 71 y.o. male with medical history significant of HTN, HLD, bipolar and recent admission for proctitis wtih prolonged admission from 9/7-13 due to psychiatric overlay presenting with AMS.  Initially the patient was unaccompanied and was able to contribute very little history in one word answers to questions.  "Sick."  Head spinning.  All night.  A little bit normal yesterday.  +headache.  +visual changes.  +dysphagia.     Clinical Impression  Patient limited mostly due to c/o fatigue and stomach discomfort demonstrating slow labored functional mobility and gait.  Patient will benefit from continued physical therapy in hospital and recommended venue below to increase strength, balance, endurance for safe ADLs and gait.    Follow Up Recommendations Home health PT;Supervision/Assistance - 24 hour    Equipment Recommendations  None recommended by PT    Recommendations for Other Services       Precautions / Restrictions Precautions Precautions: Fall Precaution Comments: fell on Sunday Restrictions Weight Bearing Restrictions: No      Mobility  Bed Mobility Overal bed mobility: Needs Assistance Bed Mobility: Supine to Sit;Sit to Supine     Supine to sit: Min guard Sit to supine: Min guard   General bed mobility comments: Defer to PT note  Transfers Overall transfer level: Needs assistance Equipment used: None;Rolling walker (2 wheeled) Transfers: Sit to/from Stand Sit to Stand: Min guard         General transfer comment: Defer to PT note  Ambulation/Gait Ambulation/Gait assistance: Min guard Ambulation Distance (Feet): 35 Feet Assistive device: Rolling walker (2 wheeled);None     Gait velocity interpretation: Below normal speed for age/gender General Gait Details: Demonstrates slightly labored  cadence with frequent leaning on walls for support without use of RW, improved balance using RW, but limited secondary to c/o fatigue  Stairs            Wheelchair Mobility    Modified Rankin (Stroke Patients Only)       Balance Overall balance assessment: Needs assistance Sitting-balance support: Feet supported;No upper extremity supported Sitting balance-Leahy Scale: Good     Standing balance support: No upper extremity supported;During functional activity Standing balance-Leahy Scale: Fair Standing balance comment: tends to lean on nearby ojects for support when not using RW                             Pertinent Vitals/Pain Pain Assessment: 0-10 Pain Score: 5  Pain Location: stomach, c/o numbness in bilateral feet Pain Intervention(s): Limited activity within patient's tolerance;Monitored during session    [place] expects to be discharged to:: Private residence Living Arrangements: Non-relatives/Friends Available Help at Discharge: Friend(s);Available 24 hours/day;Home health Type of Home: House Home Access: Stairs to enter;Level entry Entrance Stairs-Rails: Left Entrance Stairs-Number of Steps: 4 Home Layout: One level Home Equipment: Walker - 2 wheels;Bedside commode;Grab bars - tub/shower;Hand held shower head;Electric scooter      Prior Function Level of Independence: Independent   Gait / Transfers Assistance Needed: Has RW but does not use  ADL's / Homemaking Assistance Needed: Friend who lives with him assists with dressing, bathing, and household tasks on "bad days"         Hand Dominance   Dominant Hand: Right    Extremity/Trunk Assessment   Upper Extremity Assessment Upper  Extremity Assessment: Defer to OT evaluation    Lower Extremity Assessment Lower Extremity Assessment: Generalized weakness    Cervical / Trunk Assessment Cervical / Trunk Assessment: Normal  Communication   Communication: No  difficulties  Cognition Arousal/Alertness: Awake/alert Behavior During Therapy: WFL for tasks assessed/performed Overall Cognitive Status: Within Functional Limits for tasks assessed                                        General Comments      Exercises     Assessment/Plan    PT Assessment Patient needs continued PT services  PT Problem List Decreased strength;Decreased balance;Decreased activity tolerance;Decreased mobility       PT Treatment Interventions Gait training;Stair training;Functional mobility training;Therapeutic activities;Therapeutic exercise;Patient/family education    PT Goals (Current goals can be found in the Care Plan section)  Acute Rehab PT Goals Patient Stated Goal: return home Time For Goal Achievement: 09/26/17 Potential to Achieve Goals: Good    Frequency Min 3X/week   Barriers to discharge        Co-evaluation               AM-PAC PT "6 Clicks" Daily Activity  Outcome Measure Difficulty turning over in bed (including adjusting bedclothes, sheets and blankets)?: A Little Difficulty moving from lying on back to sitting on the side of the bed? : A Little Difficulty sitting down on and standing up from a chair with arms (e.g., wheelchair, bedside commode, etc,.)?: A Little Help needed moving to and from a bed to chair (including a wheelchair)?: A Little Help needed walking in hospital room?: A Little Help needed climbing 3-5 steps with a railing? : A Little 6 Click Score: 18    End of Session Equipment Utilized During Treatment: Gait belt Activity Tolerance: Patient limited by fatigue Patient left: in bed;with call bell/phone within reach;with bed alarm set Nurse Communication: Mobility status PT Visit Diagnosis: Unsteadiness on feet (R26.81);Other abnormalities of gait and mobility (R26.89);Muscle weakness (generalized) (M62.81)    Time: 9892-1194 PT Time Calculation (min) (ACUTE ONLY): 26 min   Charges:   PT  Evaluation $PT Eval Low Complexity: 1 Low PT Treatments $Therapeutic Activity: 23-37 mins   PT G Codes:   PT G-Codes **NOT FOR INPATIENT CLASS** Functional Assessment Tool Used: AM-PAC 6 Clicks Basic Mobility Functional Limitation: Mobility: Walking and moving around Mobility: Walking and Moving Around Current Status (R7408): At least 40 percent but less than 60 percent impaired, limited or restricted Mobility: Walking and Moving Around Goal Status [phone]): At least 40 percent but less than 60 percent impaired, limited or restricted Mobility: Walking and Moving Around Discharge Status [phone]): At least 40 percent but less than 60 percent impaired, limited or restricted    3:44 PM, 09/26/17 Lonell Grandchild, MPT Physical Therapist with [facility] [phone] office [phone] mobile phone

## 2017-09-26 NOTE — Consult Note (Signed)
Medication and Chart reviewed by this provider and consulted with Dr. Dwyane Dee.     Daniel Barnett, 71 y.o., male patient recently treated for proctitis after presenting to hospital ED with complaints of abdominal pain, rectal discharge.  Patient also complained of hot flashes that increased his confusion and made him feel like he had a fever.  Patient had made statement of suicidal ideation and that he had gun in his home with his stressor being that he was unable to tolerate the hot flashes.  Patient had also stated during telepsych assessment that he did not have the courage to shoot himself "he loves his life and would never do anything to hurt himself."  After careful review of patient chart for the last 3 years there was no mention of any psychiatric illness or diagnosis.  There was no psychotropic medications other than Xanax 0.25 mg Q hs that was prescribed by his primary care provider for insomnia.  Patient's major complaints have been medical with the worsening confusion, hot flashes.  The daughters' complaints of his decrease in care, confusion, paranoia could be a result of infection or other medical condition.   There is no record of past mental illness recorded and no prior psychotropic medication for past 3 years.  At this time there is no recommendation to start any psychotropic medication.  Shuvon B. Rankin, NP

## 2017-09-26 NOTE — Evaluation (Signed)
Speech Language Pathology Evaluation Patient Details Name: Daniel Barnett MRN: [id number] DOB: 01/22/1946 Today's Date: 09/26/2017 Time: 9485-4627 SLP Time Calculation (min) (ACUTE ONLY): 27 min  Problem List:  Patient Active Problem List   Diagnosis Date Noted  . Delirium 09/26/2017  . Auditory hallucination 09/26/2017  . Elevated troponin 09/26/2017  . Encephalopathy 09/25/2017  . Abdominal pain   . Hyponatremia   . Proctitis 08/23/2017  . CN (constipation) 04/18/2015  . Insomnia 04/02/2014  . BMI 30.0-30.9,adult 04/02/2014  . Essential hypertension, benign 04/13/2013  . Hyperlipidemia with target LDL less than 100 04/13/2013   Past Medical History:  Past Medical History:  Diagnosis Date  . Anxiety   . Gout   . Hyperlipidemia   . Hypertension   . Psoriasis    Past Surgical History:  Past Surgical History:  Procedure Laterality Date  . COLONOSCOPY WITH PROPOFOL N/A 08/28/2017   Procedure: COLONOSCOPY WITH PROPOFOL;  Surgeon: Daneil Dolin, MD;  [facility];  Service: Gastroenterology;  Laterality: N/A;  . POLYPECTOMY  08/28/2017   Procedure: POLYPECTOMY;  Surgeon: Daneil Dolin, MD;  Location: [facility];  Service: Gastroenterology;;  polyp at ascending colon x2, descending colon polyp  . TONSILLECTOMY    . VASECTOMY     HPI:  71 year old male with a history of hypertension, hyperlipidemia, constipation presented with altered mental status. The patient was recently discharged from the hospital after a stay from 08/23/2017 through 08/29/2017 during which the patient was treated for proctitis as well as suicidal ideation. During his hospitalization in September 2018, the patient had a number of psychiatric issues and agitation and belligerence with the medical staff. Ultimately, the patient was cleared by psychiatry to go home. Since discharge from the hospital on 08/29/2017, the patient has been poorly compliant with his medications according to his  family. The patient's daughter stated that patient has become threatening and paranoid at home. The patient has been refusing home health care, and has not been maintaining his own personal hygiene. Apparently, the patient had been refusing to go to bed, refusing PT, and refusing to speak and urinated on himself. Since discharge from the hospital on 08/29/2017, the patient had a short hospitalization at [facility] for refusing to eat or drink. He was discharged after 24 hours. On the morning of 09/26/2017, the patient was more conversive and cooperative. He states that for the better part of the week he has been hearing voices and seeing people outside his window that are not existent. In addition, the patient states that he has been having generalized weakness for the past "few days" and having difficulty getting around. He denies any headache, chest pain, sore staff, nausea, vomiting, diarrhea, abdominal pain. He stated that he had some dysuria, but was recently treated with antibiotics and this is improved. SLE ordered.   Assessment / Plan / Recommendation Clinical Impression  SLE completed at bedside and MoCA administered with Pt score of 26/30 (within normal limits). Pt was able to recall 4/5 words independently after 10 minute delay and retrieved the 5th word when provided category cue. Pt with errors on visuospatial portion, however suspect limited by poor lighting (power out currently). Pt's speech is characterized as mildly flat with imprecise articulations. Pt with mild gross oral motor weakness without asymmetry. Pt reports no change in speech skills. He does endorse "hallucinations" that "terrify" him. He is hopeful that he can be helped with this. No further acute SLP services indicated at this time. SLP will  sign off.     SLP Assessment  SLP Recommendation/Assessment: Patient does not need any further Speech Lanaguage Pathology Services SLP Visit Diagnosis: Cognitive communication deficit  (R41.841)    Follow Up Recommendations  None    Frequency and Duration           SLP Evaluation Cognition  Overall Cognitive Status: Within Functional Limits for tasks assessed Arousal/Alertness: Awake/alert Orientation Level: Oriented X4 Memory: Appears intact (recalled 4/5 words after 10 minute delay) Awareness: Appears intact Problem Solving: Appears intact Safety/Judgment: Appears intact       Comprehension  Auditory Comprehension Overall Auditory Comprehension: Appears within functional limits for tasks assessed Yes/No Questions: Within Functional Limits Commands: Within Functional Limits Conversation: Complex Visual Recognition/Discrimination Discrimination: Within Function Limits Reading Comprehension Reading Status: Not tested    Expression Expression Primary Mode of Expression: Verbal Verbal Expression Overall Verbal Expression: Appears within functional limits for tasks assessed Initiation: No impairment Automatic Speech: Name;Social Response;Month of year Level of Generative/Spontaneous Verbalization: Conversation Repetition: No impairment Naming: No impairment Pragmatics: No impairment Interfering Components: Speech intelligibility Non-Verbal Means of Communication: Not applicable Written Expression Dominant Hand: Right Written Expression: Not tested   Oral / Motor  Oral Motor/Sensory Function Overall Oral Motor/Sensory Function: Generalized oral weakness (mild) Motor Speech Overall Motor Speech: Impaired Respiration: Impaired Phonation: Normal Resonance: Within functional limits Articulation:  (mild imprecise articulation) Intelligibility: Intelligibility reduced Word: 75-100% accurate Phrase: 75-100% accurate Sentence: 75-100% accurate Conversation: 75-100% accurate Motor Planning: Witnin functional limits Interfering Components: Inadequate dentition Effective Techniques: Over-articulate   Thank you,  Genene Churn,  [place]                     Geni Skorupski 09/26/2017, 5:27 PM

## 2017-09-26 NOTE — Consult Note (Signed)
Daniel Quivira A. Merlene Laughter, MD     [email or web address]          Daniel Barnett is an 71 y.o. male.   ASSESSMENT/PLAN: 1. Encephalopathy of unclear etiology but given the associated parkinsonism, Lewy body dementia is a possibility. Psychopathology which he does have a long-standing history is also a concern. An EEG will be obtained. Additional labs will also be obtained including RPR, ANA, C-reactive protein and sedimentation rate.  2. Parkinsonism: The asymmetric nature suggest idiopathic Parkinson disease but the associated cognitive changes again possibility of Lewy body dementia. I will try a gradual and slow titration of Sinemet. Consider physical and occupational therapies.  3. Vitamin B12 deficiency: The patient will be replaced on a daily basis for now given his impairment.  4. Lacunar infarct chronic: Agree with aspirin antiplatelet agent.    The patient is 71 year old white male who has a long-standing history of significant psychiatric illness including bipolar disorder. He has had multiple psychiatric hospitalizations. The family reports that he apparently has been withdrawn from the family. He presents with confusion and altered mental status. The patient himself does admits that he has had auditory hallucinations. He apparently was supposed to be on medication for psychiatric problems but it appears that he has not been taking his medications. His current medication list does not include neuroleptics. Patient complains of having pain involving multiple joints especially the left hand. He reports that his energy level has been gradually getting worse and he is quite fatigued. He is noted to have a subtle tremor left upper extremity but seems not to be aware of this. The review systems otherwise negative.    GENERAL:  The patient is in no acute distress but has a classic mask face appearance.  HEENT: Neck is supple head is normocephalic atraumatic.  ABDOMEN:  soft  EXTREMITIES: No edema; he has multiple areas of ecchymosis involving the upper extremities; there is significant arthritic changes of the left hand.   BACK: This is unremarkable.  SKIN: Normal by inspection.    MENTAL STATUS: Alert and oriented. Speech, language and cognition are generally intact. Judgment and insight seem somewhat limited. He does seem to have evidence of significant bradyphrenia.  CRANIAL NERVES: Pupils are equal, round and reactive to light and accomodation; extra ocular movements are full, there is no significant nystagmus; visual fields are full; upper and lower facial muscles are normal in strength and symmetric, there is no flattening of the nasolabial folds; tongue is midline; uvula is midline; shoulder elevation is normal.  MOTOR: Left lower extremity is weak with hip flexion being 4/5. Otherwise, normal tone, bulk and strength; no pronator drift.  COORDINATION: Left finger to nose is normal, right finger to nose is normal, there is interrupted but there continuous low and high frequency tremor at rest involving the left upper extremity. This associated with significant cogwheeling. This is also associated with marked impairment of hand dexterity as measured by finger tapping and rapid hand opening closed maneuver. The right hand is mostly unaffected. No postural action tremor appreciated.  REFLEXES: Deep tendon reflexes are symmetrical and normal. Babinski reflexes are flexor bilaterally.   SENSATION: Normal to light touch, temperature, and pinprick.         Blood pressure (!) 92/42, pulse 63, temperature 98.3 F (36.8 C), temperature source Oral, resp. rate 17, height '6\' 2"'  (1.88 m), weight 195 lb 14.4 oz (88.9 kg), SpO2 99 %.  Past Medical History:  Diagnosis Date  . Anxiety   .  Gout   . Hyperlipidemia   . Hypertension   . Psoriasis     Past Surgical History:  Procedure Laterality Date  . COLONOSCOPY WITH PROPOFOL N/A 08/28/2017   Procedure:  COLONOSCOPY WITH PROPOFOL;  Surgeon: Daneil Dolin, MD;  [facility]: [facility];  Service: Gastroenterology;  Laterality: N/A;  . POLYPECTOMY  08/28/2017   Procedure: POLYPECTOMY;  Surgeon: Daneil Dolin, MD;  Location: [facility];  Service: Gastroenterology;;  polyp at ascending colon x2, descending colon polyp  . TONSILLECTOMY    . VASECTOMY      Family History  Problem Relation Age of Onset  . Colon cancer Neg Hx   . Colon polyps Neg Hx     Social History:  reports that he has been smoking Cigarettes.  He has never used smokeless tobacco. He reports that he drinks alcohol. He reports that he does not use drugs.  Allergies:  Allergies  Allergen Reactions  . Ciprofloxacin     Medications: Prior to Admission medications   Medication Sig Start Date End Date Taking? Authorizing Provider  allopurinol (ZYLOPRIM) 100 MG tablet Take 1 tablet (100 mg total) by mouth daily. 12/25/16  Yes Hassell Done, Mary-Margaret, FNP  ALPRAZolam Duanne Moron) 0.25 MG tablet TAKE 1 TABLET AT BEDTIME  FOR  ANXIETY 09/19/17  Yes Hawks, Christy A, FNP  Cyanocobalamin (B-12 PO) Take 1 tablet by mouth daily.   Yes [provider]  folic acid (FOLVITE) 470 MCG tablet Take 1 tablet (400 mcg total) by mouth daily. 08/29/17 08/29/18 Yes Reyne Dumas, MD  lisinopril (PRINIVIL,ZESTRIL) 20 MG tablet Take 1 tablet (20 mg total) by mouth daily. 08/28/17 08/28/18 Yes Reyne Dumas, MD  magnesium oxide (MAG-OX) 400 (241.3 Mg) MG tablet Take 1 tablet (400 mg total) by mouth daily. 08/28/17  Yes Reyne Dumas, MD  metoprolol succinate (TOPROL-XL) 50 MG 24 hr tablet TAKE 1 TABLET DAILY WITH OR IMMEDIATELY FOLLOWING A MEAL 05/22/17  Yes Chipper Herb, MD  pantoprazole (PROTONIX) 40 MG tablet Take 1 tablet (40 mg total) by mouth daily. 08/28/17  Yes Reyne Dumas, MD  polyethylene glycol (MIRALAX / GLYCOLAX) packet Take 17 g by mouth daily. 08/30/17  Yes Reyne Dumas, MD  pravastatin (PRAVACHOL) 20 MG tablet TAKE 1 TABLET EVERY  DAY 05/22/17  Yes Chipper Herb, MD  thiamine 100 MG tablet Take 1 tablet (100 mg total) by mouth daily. 08/29/17  Yes Reyne Dumas, MD    Scheduled Meds: .  stroke: mapping our early stages of recovery book   Does not apply Once  . allopurinol  100 mg Oral Daily  . ALPRAZolam  0.25 mg Oral QHS  . aspirin  300 mg Rectal Daily   Or  . aspirin  325 mg Oral Daily  . enoxaparin (LOVENOX) injection  40 mg Subcutaneous Q24H  . folic acid  1 mg Oral Daily  . Influenza vac split quadrivalent PF  0.5 mL Intramuscular Tomorrow-1000  . lisinopril  20 mg Oral Daily  . magnesium oxide  400 mg Oral Daily  . metoprolol succinate  50 mg Oral Daily  . pantoprazole  40 mg Oral Daily  . pravastatin  20 mg Oral q1800  . thiamine  100 mg Oral Daily   Continuous Infusions: . sodium chloride 100 mL/hr (09/25/17 1500)  . sodium chloride 50 mL/hr at 09/25/17 2115   PRN Meds:.acetaminophen **OR** acetaminophen (TYLENOL) oral liquid 160 mg/5 mL **OR** acetaminophen, senna-docusate     Results for orders placed or performed during  the hospital encounter of 09/25/17 (from the past 48 hour(s))  Urine rapid drug screen (hosp performed)not at [facility]     Status: None   Collection Time: 09/25/17  2:30 PM  Result Value Ref Range   Opiates NONE DETECTED NONE DETECTED   Cocaine NONE DETECTED NONE DETECTED   Benzodiazepines NONE DETECTED NONE DETECTED   Amphetamines NONE DETECTED NONE DETECTED   Tetrahydrocannabinol NONE DETECTED NONE DETECTED   Barbiturates NONE DETECTED NONE DETECTED    Comment:        DRUG SCREEN FOR MEDICAL PURPOSES ONLY.  IF CONFIRMATION IS NEEDED FOR ANY PURPOSE, NOTIFY LAB WITHIN 5 DAYS.        LOWEST DETECTABLE LIMITS FOR URINE DRUG SCREEN Drug Class       Cutoff (ng/mL) Amphetamine      1000 Barbiturate      200 Benzodiazepine   702 Tricyclics       637 Opiates          300 Cocaine          300 THC              50   Urinalysis, Routine w reflex microscopic (not at [facility])      Status: Abnormal   Collection Time: 09/25/17  2:30 PM  Result Value Ref Range   Color, Urine YELLOW YELLOW   APPearance CLEAR CLEAR   Specific Gravity, Urine 1.018 1.005 - 1.030   pH 5.0 5.0 - 8.0   Glucose, UA NEGATIVE NEGATIVE mg/dL   Hgb urine dipstick NEGATIVE NEGATIVE   Bilirubin Urine NEGATIVE NEGATIVE   Ketones, ur 5 (A) NEGATIVE mg/dL   Protein, ur NEGATIVE NEGATIVE mg/dL   Nitrite NEGATIVE NEGATIVE   Leukocytes, UA NEGATIVE NEGATIVE  CBC with Differential     Status: None   Collection Time: 09/25/17  2:40 PM  Result Value Ref Range   WBC 7.9 4.0 - 10.5 K/uL   RBC 4.74 4.22 - 5.81 MIL/uL   Hemoglobin 15.5 13.0 - 17.0 g/dL   HCT 43.2 39.0 - 52.0 %   MCV 91.1 78.0 - 100.0 fL   MCH 32.7 26.0 - 34.0 pg   MCHC 35.9 30.0 - 36.0 g/dL   RDW 11.6 11.5 - 15.5 %   Platelets 217 150 - 400 K/uL   Neutrophils Relative % 77 %   Neutro Abs 6.0 1.7 - 7.7 K/uL   Lymphocytes Relative 13 %   Lymphs Abs 1.1 0.7 - 4.0 K/uL   Monocytes Relative 9 %   Monocytes Absolute 0.7 0.1 - 1.0 K/uL   Eosinophils Relative 1 %   Eosinophils Absolute 0.1 0.0 - 0.7 K/uL   Basophils Relative 0 %   Basophils Absolute 0.0 0.0 - 0.1 K/uL  Troponin I     Status: Abnormal   Collection Time: 09/25/17  2:40 PM  Result Value Ref Range   Troponin I 0.04 (HH) <0.03 ng/mL    Comment: CRITICAL RESULT CALLED TO, READ BACK BY AND VERIFIED WITH: EDWARDS,C AT 1620 ON 10.10.2018 BY ISLEY,B   Ethanol     Status: None   Collection Time: 09/25/17  2:43 PM  Result Value Ref Range   Alcohol, Ethyl (B) <10 <10 mg/dL    Comment:        LOWEST DETECTABLE LIMIT FOR SERUM ALCOHOL IS 10 mg/dL FOR MEDICAL PURPOSES ONLY   Protime-INR     Status: None   Collection Time: 09/25/17  2:44 PM  Result Value Ref Range  Prothrombin Time 13.1 11.4 - 15.2 seconds   INR 1.00   APTT     Status: None   Collection Time: 09/25/17  2:44 PM  Result Value Ref Range   aPTT 32 24 - 36 seconds  Comprehensive metabolic panel     Status:  Abnormal   Collection Time: 09/25/17  2:44 PM  Result Value Ref Range   Sodium 131 (L) 135 - 145 mmol/L   Potassium 3.6 3.5 - 5.1 mmol/L   Chloride 93 (L) 101 - 111 mmol/L   CO2 26 22 - 32 mmol/L   Glucose, Bld 103 (H) 65 - 99 mg/dL   BUN 35 (H) 6 - 20 mg/dL   Creatinine, Ser 1.11 0.61 - 1.24 mg/dL   Calcium 9.5 8.9 - 10.3 mg/dL   Total Protein 7.5 6.5 - 8.1 g/dL   Albumin 4.0 3.5 - 5.0 g/dL   AST 33 15 - 41 U/L   ALT 31 17 - 63 U/L   Alkaline Phosphatase 64 38 - 126 U/L   Total Bilirubin 1.4 (H) 0.3 - 1.2 mg/dL   GFR calc non Af Amer >60 >60 mL/min   GFR calc Af Amer >60 >60 mL/min    Comment: (NOTE) The eGFR has been calculated using the CKD EPI equation. This calculation has not been validated in all clinical situations. eGFR's persistently <60 mL/min signify possible Chronic Kidney Disease.    Anion gap 12 5 - 15  Ammonia     Status: None   Collection Time: 09/25/17  2:44 PM  Result Value Ref Range   Ammonia 10 9 - 35 umol/L  Troponin I     Status: Abnormal   Collection Time: 09/25/17  8:55 PM  Result Value Ref Range   Troponin I 0.04 (HH) <0.03 ng/mL    Comment: CRITICAL VALUE NOTED.  VALUE IS CONSISTENT WITH PREVIOUSLY REPORTED AND CALLED VALUE.  Troponin I     Status: Abnormal   Collection Time: 09/26/17  2:53 AM  Result Value Ref Range   Troponin I 0.03 (HH) <0.03 ng/mL    Comment: CRITICAL VALUE NOTED.  VALUE IS CONSISTENT WITH PREVIOUSLY REPORTED AND CALLED VALUE.  Lipid panel     Status: Abnormal   Collection Time: 09/26/17  2:53 AM  Result Value Ref Range   Cholesterol 153 0 - 200 mg/dL   Triglycerides 67 <150 mg/dL   HDL 39 (L) >40 mg/dL   Total CHOL/HDL Ratio 3.9 RATIO   VLDL 13 0 - 40 mg/dL   LDL Cholesterol 101 (H) 0 - 99 mg/dL    Comment:        Total Cholesterol/HDL:CHD Risk Coronary Heart Disease Risk Table                     Men   Women  1/2 Average Risk   3.4   3.3  Average Risk       5.0   4.4  2 X Average Risk   9.6   7.1  3 X Average  Risk  23.4   11.0        Use the calculated Patient Ratio above and the CHD Risk Table to determine the patient's CHD Risk.        ATP III CLASSIFICATION (LDL):  <100     mg/dL   Optimal  100-129  mg/dL   Near or Above  Optimal  130-159  mg/dL   Borderline  160-189  mg/dL   High  >190     mg/dL   Very High     Prior labs done September 2018: TSH 1.69, vitamin B12 low at 204 and HIV negative.      Studies/Results:  HEAD CT FINDINGS: Brain: No acute infarction, hemorrhage, hydrocephalus, extra-axial collection or mass lesion. Remote lacunar infarct in the left caudate. Overall mild microvascular ischemic type gliosis in the cerebral white matter. Age normal brain volume.  Vascular: Major flow voids are preserved.  Skull and upper cervical spine: Negative for marrow lesion  Sinuses/Orbits: Negative  Other: Intermittent motion degradation, overall mild.  IMPRESSION: 1. No acute finding. 2. Remote lacunar infarct of the left caudate.     The brain MRI is reviewed in person. There is a lacunar infarct seen on T1 and FLAIR with encephalomalacia involving the left cord and nuclide. There are about 5 small deep white matter increased signal seen on FLAIR imaging. One of concern however involves the left frontal region which involves the cortex and associated with encephalomalacia. This is seen about 4 cuts.    Zaccary Creech A. Merlene Barnett, M.D.  Diplomate, Tax adviser of Psychiatry and Neurology ( Neurology). 09/26/2017, 9:18 AM

## 2017-09-26 NOTE — Procedures (Signed)
  [place] A. Merlene Laughter, MD     [email or web address]           HISTORY: This is a 71 year old man who presents with episodic confusion.  This study is being done to evaluate for seizures as the cause of the confusion.  MEDICATIONS: Scheduled Meds: .  stroke: mapping our early stages of recovery book   Does not apply Once  . allopurinol  100 mg Oral Daily  . ALPRAZolam  0.25 mg Oral QHS  . aspirin  300 mg Rectal Daily   Or  . aspirin  325 mg Oral Daily  . carbidopa-levodopa  1 tablet Oral BID  . cyanocobalamin  1,000 mcg Intramuscular Daily  . enoxaparin (LOVENOX) injection  40 mg Subcutaneous Q24H  . folic acid  1 mg Oral Daily  . magnesium oxide  400 mg Oral Daily  . metoprolol succinate  25 mg Oral Daily  . pantoprazole  40 mg Oral Daily  . pravastatin  20 mg Oral q1800  . thiamine  100 mg Oral Daily   Continuous Infusions: . sodium chloride 100 mL/hr (09/25/17 1500)  . sodium chloride 50 mL/hr at 09/25/17 2115   PRN Meds:.acetaminophen **OR** acetaminophen (TYLENOL) oral liquid 160 mg/5 mL **OR** acetaminophen, senna-docusate  Prior to Admission medications   Medication Sig Start Date End Date Taking? Authorizing Provider  allopurinol (ZYLOPRIM) 100 MG tablet Take 1 tablet (100 mg total) by mouth daily. 12/25/16  Yes Hassell Done, Mary-Margaret, FNP  ALPRAZolam Duanne Moron) 0.25 MG tablet TAKE 1 TABLET AT BEDTIME  FOR  ANXIETY 09/19/17  Yes Hawks, Christy A, FNP  Cyanocobalamin (B-12 PO) Take 1 tablet by mouth daily.   Yes [provider]  folic acid (FOLVITE) 973 MCG tablet Take 1 tablet (400 mcg total) by mouth daily. 08/29/17 08/29/18 Yes Reyne Dumas, MD  lisinopril (PRINIVIL,ZESTRIL) 20 MG tablet Take 1 tablet (20 mg total) by mouth daily. 08/28/17 08/28/18 Yes Reyne Dumas, MD  magnesium oxide (MAG-OX) 400 (241.3 Mg) MG tablet Take 1 tablet (400 mg total) by mouth daily. 08/28/17  Yes Reyne Dumas, MD  metoprolol succinate (TOPROL-XL) 50 MG 24 hr tablet  TAKE 1 TABLET DAILY WITH OR IMMEDIATELY FOLLOWING A MEAL 05/22/17  Yes Chipper Herb, MD  pantoprazole (PROTONIX) 40 MG tablet Take 1 tablet (40 mg total) by mouth daily. 08/28/17  Yes Reyne Dumas, MD  polyethylene glycol (MIRALAX / GLYCOLAX) packet Take 17 g by mouth daily. 08/30/17  Yes Reyne Dumas, MD  pravastatin (PRAVACHOL) 20 MG tablet TAKE 1 TABLET EVERY DAY 05/22/17  Yes Chipper Herb, MD  thiamine 100 MG tablet Take 1 tablet (100 mg total) by mouth daily. 08/29/17  Yes Reyne Dumas, MD      ANALYSIS: A 16 channel recording using standard 10 20 measurements is conducted for 21 minutes. There is a dominant posterior rhythm of 10 Hz which attenuates with eye opening. There is beta activity seen over the frontal fields. Awake and drowsy activities are seen. There is significant myogenic artifact seen throughout the recording.  Brief transient bitemporal sharp wave activities are observed during drowsiness. Rudimentary sleep with K complexes and spindles is seen. Photic stimulation and hyperventilation were not done. There is no focal slowing, lateralized slowing or epileptiform activity.   IMPRESSION: 1.  This is a normal recording of awake and drowsy states.      Caylie Sandquist A. Merlene Laughter, M.D.  Diplomate, Tax adviser of Psychiatry and Neurology ( Neurology).

## 2017-09-26 NOTE — Evaluation (Signed)
Occupational Therapy Evaluation Patient Details Name: Daniel Barnett MRN: [id number] DOB: 07-29-1946 Today's Date: 09/26/2017    History of Present Illness  Daniel Barnett is a 71 y.o. male with medical history significant of HTN, HLD, bipolar and recent admission for proctitis wtih prolonged admission from 9/7-13 due to psychiatric overlay presenting with AMS.  Initially the patient was unaccompanied and was able to contribute very little history in one word answers to questions.  "Sick."  Head spinning.  All night.  A little bit normal yesterday.  +headache.  +visual changes.  +dysphagia.    Clinical Impression   Pt received supine in bed, marginally agreeable to OT evaluation. PT entered during evaluation and co-eval completed. Pt reports feeling weak, while in bed noted to reach for items with BUE, coordination intact with functional tasks. Pt requiring increased time for task completion, supervision for standing tasks. Has friend with him 24/7 at home for supervision, assists with ADLs as needed. Will continue to see pt while in acute care to further assess safety with standing tasks. No follow up OT recommended.     Follow Up Recommendations  No OT follow up;Supervision/Assistance - 24 hour    Equipment Recommendations  None recommended by OT       Precautions / Restrictions Precautions Precautions: Fall Precaution Comments: fell on Sunday Restrictions Weight Bearing Restrictions: No      Mobility Bed Mobility               General bed mobility comments: Defer to PT note  Transfers                 General transfer comment: Defer to PT note        ADL either performed or assessed with clinical judgement   ADL Overall ADL's : Needs assistance/impaired                                     Functional mobility during ADLs: Supervision/safety;Min guard;Rolling walker General ADL Comments: Pt currently requiring supervision and increased time  for ADL completion, verbal cuing for sequencing and follow through.                   Pertinent Vitals/Pain Pain Assessment: No/denies pain     Hand Dominance Right   Extremity/Trunk Assessment Upper Extremity Assessment Upper Extremity Assessment: Generalized weakness   Lower Extremity Assessment Lower Extremity Assessment: Defer to PT evaluation   Cervical / Trunk Assessment Cervical / Trunk Assessment: Normal   Communication Communication Communication: No difficulties   Cognition Arousal/Alertness: Awake/alert Behavior During Therapy: WFL for tasks assessed/performed Overall Cognitive Status: Within Functional Limits for tasks assessed                                                Home Living Family/patient expects to be discharged to:: Private residence Living Arrangements: Non-relatives/Friends (friend stays with him) Available Help at Discharge: Friend(s);Available 24 hours/day;Home health (HHPT) Type of Home: House Home Access: Stairs to enter;Level entry (at the back; level at front) Technical brewer of Steps: 4 Entrance Stairs-Rails: Left Home Layout: One level     Bathroom Shower/Tub: Occupational psychologist: Standard     Home Equipment: Environmental consultant - 2 wheels;Bedside commode;Grab bars - tub/shower;Hand held shower  head          Prior Functioning/Environment Level of Independence: Needs assistance  Gait / Transfers Assistance Needed: Has RW but does not use ADL's / Homemaking Assistance Needed: Friend who lives with him assists with dressing, bathing, and household tasks on "bad days"             OT Problem List: Decreased strength;Decreased activity tolerance;Impaired balance (sitting and/or standing);Decreased safety awareness;Decreased cognition      OT Treatment/Interventions: Self-care/ADL training;Therapeutic exercise;Therapeutic activities;Patient/family education;DME and/or AE instruction    OT  Goals(Current goals can be found in the care plan section) Acute Rehab OT Goals Patient Stated Goal: none stated  OT Frequency: Min 2X/week    End of Session Equipment Utilized During Treatment: Gait belt;Rolling walker Nurse Communication: Mobility status  Activity Tolerance: Patient tolerated treatment well Patient left: in bed;with call bell/phone within reach;with bed alarm set  OT Visit Diagnosis: Muscle weakness (generalized) (M62.81)                Time: 5631-4970 OT Time Calculation (min): 22 min Charges:  OT General Charges $OT Visit: 1 Visit OT Evaluation $OT Eval Low Complexity: Murray, OTR/L  [phone] 09/26/2017, 3:33 PM

## 2017-09-26 NOTE — Progress Notes (Signed)
PROGRESS NOTE  Daniel Barnett PXT:[id number] DOB: 1946-06-23 DOA: 09/25/2017 PCP: Chevis Pretty, FNP  Brief History:  71 year old male with a history of hypertension, hyperlipidemia, constipation presented with altered mental status. The patient was recently discharged from the hospital after a stay from 08/23/2017 through 08/29/2017 during which the patient was treated for proctitis as well as suicidal ideation. During his hospitalization in September 2018, the patient had a number of psychiatric issues and agitation and belligerence with the medical staff. Ultimately, the patient was cleared by psychiatry to go home.  Since discharge from the hospital on 08/29/2017, the patient has been poorly compliant with his medications according to his family. The patient's daughter stated that patient has become threatening and paranoid at home. The patient has been refusing home health care, and has not been maintaining his own personal hygiene. Apparently, the patient had been refusing to go to bed, refusing PT, and refusing to speak and urinated on himself.  Since discharge from the hospital on 08/29/2017, the patient had a short hospitalization at [facility] for refusing to eat or drink. He was discharged after 24 hours.  On the morning of 09/26/2017, the patient was more conversive and cooperative. He states that for the better part of the week he has been hearing voices and seeing people outside his window that are not existent. In addition, the patient states that he has been having generalized weakness for the past "few days" and having difficulty getting around. He denies any headache, chest pain, sore staff, nausea, vomiting, diarrhea, abdominal pain. He stated that he had some dysuria, but was recently treated with antibiotics and this is improved.  Assessment/Plan: Delirium/Acute Encephalopathy -I question whether the patient truly has delirium or mental status change -There appears  to be significant psychiatric overlay -09/26/17--much improved--A&Ox 3 -08/28/2017 TSH 1.695 -08/28/2017 B12--204 -09/26/2017 MR brain--negative for acute findings, remote lacunar infarct left caudate  -Although my suspicion is low for his low normal B12 level partly contributing to his mental status and weakness, will treat empirically  -Check RPR -HIV negative on 08/26/2017 -Urinalysis negative for pyuria -Check ammonia--10 -Check magnesium  -consult psychiatry -consult neurology -09/25/2017 CT brain negative -Check thiamine level -B12 injection 1050mcg  Auditory and visual hallucinations -consult psychiatry  Abdominal pain -09/25/2017 CT abdomen and pelvis--remote L1 compression fracture, no acute findings -tolerating diet presently -08/28/2017 colonoscopy--no endoscopic evidence of proctitis or colitis, 3 polyps removed  Essential hypertension -Holding lisinopril secondary to soft blood pressure  -decrease dose of metoprolol succinate  Hyperlipidemia -Continue statin  Elevated troponin -no chest pain reported -likely demand ischemia -trend is flat -08/29/2017 echo EF 60-65%, grade 1 DD, no WMA  Hyponatremia -this has been chronic with range of 126-131 -stable   Disposition Plan:   Home vs SNF vs [facility] --He is medically stable for d/c pending psychiatry eval  Family Communication:   No Family at bedside  Consultants:  Neurology; psychiatry  Code Status: DNR  DVT Prophylaxis:[place] Lovenox Total time spent 35 minutes.  Greater than 50% spent face to face counseling and coordinating care.   Procedures: As Listed in Progress Note Above  Antibiotics: None    Subjective: Patient denies fevers, chills, headache, chest pain, dyspnea, nausea, vomiting, diarrhea, abdominal pain, dysuria, hematuria, hematochezia, and melena.   Objective: Vitals:   09/25/17 2300 09/26/17 0100 09/26/17 0300 09/26/17 0500  BP: (!) 84/62 92/60 (!) 100/58 (!) 92/42  Pulse: 68 65  68 63  Resp:  16 16 15 17   Temp: (!) 97.5 F (36.4 C) 97.8 F (36.6 C) 97.8 F (36.6 C) 98.3 F (36.8 C)  TempSrc: Oral Oral Oral Oral  SpO2: 99% 98% 99% 99%  Weight:      Height:        Intake/Output Summary (Last 24 hours) at 09/26/17 0848 Last data filed at 09/26/17 0500  Gross per 24 hour  Intake           1997.5 ml  Output              400 ml  Net           1597.5 ml   Weight change:  Exam:   General:  Pt is alert, follows commands appropriately, not in acute distress  HEENT: No icterus, No thrush, No neck mass, [place]/AT  Cardiovascular: RRR, S1/S2, no rubs, no gallops  Respiratory: CTA bilaterally, no wheezing, no crackles, no rhonchi  Abdomen: Soft/+BS, non tender, non distended, no guarding  Extremities: No edema, No lymphangitis, No petechiae, No rashes, no synovitis  Neuro:  CN II-XII intact, strength 4-/5 in RUE, RLE, strength 4-/5 LUE, LLE; sensation intact bilateral; no dysmetria; babinski equivocal     Data Reviewed: I have personally reviewed following labs and imaging studies Basic Metabolic Panel:  Recent Labs Lab 09/25/17 1444  NA 131*  K 3.6  CL 93*  CO2 26  GLUCOSE 103*  BUN 35*  CREATININE 1.11  CALCIUM 9.5   Liver Function Tests:  Recent Labs Lab 09/25/17 1444  AST 33  ALT 31  ALKPHOS 64  BILITOT 1.4*  PROT 7.5  ALBUMIN 4.0   No results for input(s): LIPASE, AMYLASE in the last 168 hours.  Recent Labs Lab 09/25/17 1444  AMMONIA 10   Coagulation Profile:  Recent Labs Lab 09/25/17 1444  INR 1.00   CBC:  Recent Labs Lab 09/25/17 1440  WBC 7.9  NEUTROABS 6.0  HGB 15.5  HCT 43.2  MCV 91.1  PLT 217   Cardiac Enzymes:  Recent Labs Lab 09/25/17 1440 09/25/17 2055 09/26/17 0253  TROPONINI 0.04* 0.04* 0.03*   BNP: Invalid input(s): POCBNP CBG: No results for input(s): GLUCAP in the last 168 hours. HbA1C: No results for input(s): HGBA1C in the last 72 hours. Urine analysis:    Component Value  Date/Time   COLORURINE YELLOW 09/25/2017 1430   APPEARANCEUR CLEAR 09/25/2017 1430   LABSPEC 1.018 09/25/2017 1430   PHURINE 5.0 09/25/2017 1430   GLUCOSEU NEGATIVE 09/25/2017 1430   HGBUR NEGATIVE 09/25/2017 1430   BILIRUBINUR NEGATIVE 09/25/2017 1430   KETONESUR 5 (A) 09/25/2017 1430   PROTEINUR NEGATIVE 09/25/2017 1430   NITRITE NEGATIVE 09/25/2017 1430   LEUKOCYTESUR NEGATIVE 09/25/2017 1430   Sepsis Labs: @LABRCNTIP (procalcitonin:4,lacticidven:4) )No results found for this or any previous visit (from the past 240 hour(s)).   Scheduled Meds: .  stroke: mapping our early stages of recovery book   Does not apply Once  . allopurinol  100 mg Oral Daily  . ALPRAZolam  0.25 mg Oral QHS  . aspirin  300 mg Rectal Daily   Or  . aspirin  325 mg Oral Daily  . enoxaparin (LOVENOX) injection  40 mg Subcutaneous Q24H  . folic acid  1 mg Oral Daily  . Influenza vac split quadrivalent PF  0.5 mL Intramuscular Tomorrow-1000  . lisinopril  20 mg Oral Daily  . magnesium oxide  400 mg Oral Daily  . metoprolol succinate  50 mg Oral Daily  .  pantoprazole  40 mg Oral Daily  . pravastatin  20 mg Oral q1800  . thiamine  100 mg Oral Daily   Continuous Infusions: . sodium chloride 100 mL/hr (09/25/17 1500)  . sodium chloride 50 mL/hr at 09/25/17 2115    Procedures/Studies: Ct Head Wo Contrast  Result Date: 09/25/2017 CLINICAL DATA:  Altered mental status today. EXAM: CT HEAD WITHOUT CONTRAST TECHNIQUE: Contiguous axial images were obtained from the base of the skull through the vertex without intravenous contrast. COMPARISON:  None. FINDINGS: Brain: Age related cerebral atrophy, ventriculomegaly and periventricular white matter disease. No CT findings for acute hemispheric infarction or intracranial hemorrhage. No extra-axial fluid collections. No mass lesions. The brainstem and cerebellum are grossly normal. Vascular: No hyperdense vessel or unexpected calcification. Skull: No skull fracture or  bone lesions. Sinuses/Orbits: The paranasal sinuses and mastoid air cells are clear. The globes are intact. Other: No scalp lesions or hematoma. IMPRESSION: Mild age related changes but no acute process or mass lesion. Electronically Signed   By: Marijo Sanes M.D.   On: 09/25/2017 15:24   Ct Abdomen Pelvis W Contrast  Result Date: 09/25/2017 CLINICAL DATA:  Encephalopathy. The patient grimaced with abdominal palpation. EXAM: CT ABDOMEN AND PELVIS WITH CONTRAST TECHNIQUE: Multidetector CT imaging of the abdomen and pelvis was performed using the standard protocol following bolus administration of intravenous contrast. CONTRAST:  147mL ISOVUE-300 IOPAMIDOL (ISOVUE-300) INJECTION 61% COMPARISON:  CT of the abdomen and pelvis 08/31/2017 FINDINGS: Lower chest: The lung bases are clear without focal nodule, mass, or airspace disease. The heart size is normal. Coronary artery calcifications are again seen. No significant pleural or pericardial effusion is present. Hepatobiliary: No focal liver abnormality is seen. No gallstones, gallbladder wall thickening, or biliary dilatation. Pancreas: Unremarkable. No pancreatic ductal dilatation or surrounding inflammatory changes. Spleen: Normal in size without focal abnormality. Adrenals/Urinary Tract: Adrenal glands are normal bilaterally. The kidneys and ureters are within normal limits bilaterally. A left-sided bladder diverticulum is noted without focal stone or inflammatory change. The urinary bladder is otherwise normal. Stomach/Bowel: The stomach and duodenum are within normal limits. Small bowel is unremarkable. The appendix is visualized and normal. The ascending and transverse colon are within normal limits. The descending and sigmoid colon are normal. Vascular/Lymphatic: Atherosclerotic calcifications are present in the aorta and branch vessels without aneurysm. No significant adenopathy is present. Reproductive: Prostate is mildly enlarged, measuring 5 cm in  transverse diameter. Penile calcifications are again seen. Other: No abdominal wall hernia or abnormality. No abdominopelvic ascites. Musculoskeletal: Remote L1 superior endplate fracture is present. Bilateral L5 pars defects are noted. Grade 1 anterolisthesis is present at L5-S1. The pelvis is intact. The hips are located and within normal limits bilaterally. IMPRESSION: 1. No acute or focal abnormality to explain abdominal pain. 2.  Aortic Atherosclerosis (ICD10-I70.0). 3. Remote L1 compression fracture. 4. Bilateral L5 pars defects. Electronically Signed   By: San Morelle M.D.   On: 09/25/2017 20:52   Dg Chest Portable 1 View  Result Date: 09/25/2017 CLINICAL DATA:  71 year old male with history of weakness. EXAM: PORTABLE CHEST 1 VIEW COMPARISON:  Chest x-ray 08/23/2017. FINDINGS: Study is limited by lack of visualization of the lung bases. With this limitation in mind lung volumes are normal. No consolidative airspace disease. No definite pleural effusions (small pleural effusions cannot be excluded secondary to exclusion of the lung bases). No pneumothorax. No pulmonary nodule or mass noted. Pulmonary vasculature and the cardiomediastinal silhouette are within normal limits. IMPRESSION: 1. No definite radiographic evidence  of acute cardiopulmonary disease. Electronically Signed   By: Vinnie Langton M.D.   On: 09/25/2017 15:01    Latiya Navia, DO  [facility] Pager [phone]  If 7PM-7AM, please contact night-coverage [email or web address] Password [email or web address] 09/26/2017, 8:48 AM   LOS: 1 day

## 2017-09-26 NOTE — ED Provider Notes (Signed)
[facility] DEPT Provider Note   CSN: [id number] Arrival date & time: 09/25/17  1412     History   Chief Complaint No chief complaint on file.   HPI Daniel Barnett is a 71 y.o. male.  HPI Pt presented to the ED with altered mental status, question of a stroke.  Initial report was provided by EMS and the patient.  Patient is not able to provide much history.  EMS stated that patient was last seen normal at 9pm the previous evening.  They noted generalized weakness but a question of a right facial droop.  Pt denies headaches, state he feels weak all over.  No fevers, or chills.  No vomiting or diarrhea. Past Medical History:  Diagnosis Date  . Anxiety   . Gout   . Hyperlipidemia   . Hypertension   . Psoriasis     Patient Active Problem List   Diagnosis Date Noted  . Delirium 09/26/2017  . Auditory hallucination 09/26/2017  . Elevated troponin 09/26/2017  . Encephalopathy 09/25/2017  . Abdominal pain   . Hyponatremia   . Proctitis 08/23/2017  . CN (constipation) 04/18/2015  . Insomnia 04/02/2014  . BMI 30.0-30.9,adult 04/02/2014  . Essential hypertension, benign 04/13/2013  . Hyperlipidemia with target LDL less than 100 04/13/2013    Past Surgical History:  Procedure Laterality Date  . COLONOSCOPY WITH PROPOFOL N/A 08/28/2017   Procedure: COLONOSCOPY WITH PROPOFOL;  Surgeon: Daneil Dolin, MD;  Location: [facility];  Service: Gastroenterology;  Laterality: N/A;  . POLYPECTOMY  08/28/2017   Procedure: POLYPECTOMY;  Surgeon: Daneil Dolin, MD;  Location: [facility];  Service: Gastroenterology;;  polyp at ascending colon x2, descending colon polyp  . TONSILLECTOMY    . VASECTOMY         Home Medications    Prior to Admission medications   Medication Sig Start Date End Date Taking? Authorizing Provider  allopurinol (ZYLOPRIM) 100 MG tablet Take 1 tablet (100 mg total) by mouth daily. 12/25/16  Yes Hassell Done, Mary-Margaret, FNP  ALPRAZolam Duanne Moron) 0.25 MG  tablet TAKE 1 TABLET AT BEDTIME  FOR  ANXIETY 09/19/17  Yes Hawks, Christy A, FNP  Cyanocobalamin (B-12 PO) Take 1 tablet by mouth daily.   Yes [provider]  folic acid (FOLVITE) 408 MCG tablet Take 1 tablet (400 mcg total) by mouth daily. 08/29/17 08/29/18 Yes Reyne Dumas, MD  lisinopril (PRINIVIL,ZESTRIL) 20 MG tablet Take 1 tablet (20 mg total) by mouth daily. 08/28/17 08/28/18 Yes Reyne Dumas, MD  magnesium oxide (MAG-OX) 400 (241.3 Mg) MG tablet Take 1 tablet (400 mg total) by mouth daily. 08/28/17  Yes Reyne Dumas, MD  metoprolol succinate (TOPROL-XL) 50 MG 24 hr tablet TAKE 1 TABLET DAILY WITH OR IMMEDIATELY FOLLOWING A MEAL 05/22/17  Yes Chipper Herb, MD  pantoprazole (PROTONIX) 40 MG tablet Take 1 tablet (40 mg total) by mouth daily. 08/28/17  Yes Reyne Dumas, MD  polyethylene glycol (MIRALAX / GLYCOLAX) packet Take 17 g by mouth daily. 08/30/17  Yes Reyne Dumas, MD  pravastatin (PRAVACHOL) 20 MG tablet TAKE 1 TABLET EVERY DAY 05/22/17  Yes Chipper Herb, MD  thiamine 100 MG tablet Take 1 tablet (100 mg total) by mouth daily. 08/29/17  Yes Reyne Dumas, MD    Family History Family History  Problem Relation Age of Onset  . Colon cancer Neg Hx   . Colon polyps Neg Hx     Social History Social History  Substance Use Topics  . Smoking  status: Current Every Day Smoker    Types: Cigarettes  . Smokeless tobacco: Never Used     Comment: 1/2 to 1 pack a day   . Alcohol use Yes     Comment: Four 12 ounces of beer a day     Allergies   Ciprofloxacin   Review of Systems Review of Systems  All other systems reviewed and are negative.    Physical Exam Updated Vital Signs BP (!) 92/42 (BP Location: Left Arm)   Pulse 63   Temp 98.3 F (36.8 C) (Oral)   Resp 17   Ht 1.88 m (6\' 2" )   Wt 88.9 kg (195 lb 14.4 oz)   SpO2 99%   BMI 25.15 kg/m   Physical Exam  Constitutional: He is oriented to person, place, and time. He appears well-developed and  well-nourished. He appears listless. No distress.  HENT:  Head: Normocephalic and atraumatic.  Right Ear: External ear normal.  Left Ear: External ear normal.  Mouth/Throat: Oropharynx is clear and moist.  Eyes: Conjunctivae are normal. Right eye exhibits no discharge. Left eye exhibits no discharge. No scleral icterus.  Neck: Neck supple. No tracheal deviation present.  Cardiovascular: Normal rate, regular rhythm and intact distal pulses.   Pulmonary/Chest: Effort normal and breath sounds normal. No stridor. No respiratory distress. He has no wheezes. He has no rales.  Abdominal: Soft. Bowel sounds are normal. He exhibits no distension. There is no tenderness. There is no rebound and no guarding.  Musculoskeletal: He exhibits no edema or tenderness.  Neurological: He is oriented to person, place, and time. He appears listless. No cranial nerve deficit (No facial droop, extraocular movements intact, tongue midline ) or sensory deficit. He exhibits normal muscle tone. He displays no seizure activity. Coordination normal.  Lifts both arms off the bed but requires significant effort, unable to hold both legs off bed for 5 seconds, sensation intact in all extremities, no visual field cuts, no left or right sided neglect, normal finger-nose exam bilaterally, no nystagmus noted; generalized weakness   Skin: Skin is warm and dry. No rash noted.  Psychiatric: His affect is blunt. He is not slowed.  Nursing note and vitals reviewed.    ED Treatments / Results  Labs (all labs ordered are listed, but only abnormal results are displayed) Labs Reviewed  COMPREHENSIVE METABOLIC PANEL - Abnormal; Notable for the following:       Result Value   Sodium 131 (*)    Chloride 93 (*)    Glucose, Bld 103 (*)    BUN 35 (*)    Total Bilirubin 1.4 (*)    All other components within normal limits  URINALYSIS, ROUTINE W REFLEX MICROSCOPIC - Abnormal; Notable for the following:    Ketones, ur 5 (*)    All  other components within normal limits  TROPONIN I - Abnormal; Notable for the following:    Troponin I 0.04 (*)    All other components within normal limits  TROPONIN I - Abnormal; Notable for the following:    Troponin I 0.04 (*)    All other components within normal limits  TROPONIN I - Abnormal; Notable for the following:    Troponin I 0.03 (*)    All other components within normal limits  TROPONIN I - Abnormal; Notable for the following:    Troponin I 0.03 (*)    All other components within normal limits  LIPID PANEL - Abnormal; Notable for the following:    HDL 39 (*)  LDL Cholesterol 101 (*)    All other components within normal limits  SEDIMENTATION RATE - Abnormal; Notable for the following:    Sed Rate 32 (*)    All other components within normal limits  CBC WITH DIFFERENTIAL/PLATELET  ETHANOL  PROTIME-INR  APTT  RAPID URINE DRUG SCREEN, HOSP PERFORMED  AMMONIA  HEMOGLOBIN A1C  HOMOCYSTEINE  RPR  ANTINUCLEAR ANTIBODIES, IFA  C-REACTIVE PROTEIN  MAGNESIUM  TSH  VITAMIN B1  BASIC METABOLIC PANEL    EKG  EKG Interpretation  Date/Time:  Wednesday September 25 2017 14:13:36 EDT Ventricular Rate:  100 PR Interval:    QRS Duration: 84 QT Interval:  331 QTC Calculation: 427 R Axis:   70 Text Interpretation:  Sinus tachycardia Probable lateral infarct, old No significant change since last tracing Confirmed by Dorie Rank [phone]) on 09/25/2017 2:15:33 PM       Radiology Ct Head Wo Contrast  Result Date: 09/25/2017 CLINICAL DATA:  Altered mental status today. EXAM: CT HEAD WITHOUT CONTRAST TECHNIQUE: Contiguous axial images were obtained from the base of the skull through the vertex without intravenous contrast. COMPARISON:  None. FINDINGS: Brain: Age related cerebral atrophy, ventriculomegaly and periventricular white matter disease. No CT findings for acute hemispheric infarction or intracranial hemorrhage. No extra-axial fluid collections. No mass lesions. The  brainstem and cerebellum are grossly normal. Vascular: No hyperdense vessel or unexpected calcification. Skull: No skull fracture or bone lesions. Sinuses/Orbits: The paranasal sinuses and mastoid air cells are clear. The globes are intact. Other: No scalp lesions or hematoma. IMPRESSION: Mild age related changes but no acute process or mass lesion. Electronically Signed   By: Marijo Sanes M.D.   On: 09/25/2017 15:24   Mr Brain Wo Contrast  Result Date: 09/26/2017 CLINICAL DATA:  Altered mental status with confusion and slurred speech. EXAM: MRI HEAD WITHOUT CONTRAST TECHNIQUE: Multiplanar, multiecho pulse sequences of the brain and surrounding structures were obtained without intravenous contrast. COMPARISON:  Head CT from yesterday FINDINGS: Brain: No acute infarction, hemorrhage, hydrocephalus, extra-axial collection or mass lesion. Remote lacunar infarct in the left caudate. Overall mild microvascular ischemic type gliosis in the cerebral white matter. Age normal brain volume. Vascular: Major flow voids are preserved. Skull and upper cervical spine: Negative for marrow lesion Sinuses/Orbits: Negative Other: Intermittent motion degradation, overall mild. IMPRESSION: 1. No acute finding. 2. Remote lacunar infarct of the left caudate. Electronically Signed   By: Monte Fantasia M.D.   On: 09/26/2017 09:08   Ct Abdomen Pelvis W Contrast  Result Date: 09/25/2017 CLINICAL DATA:  Encephalopathy. The patient grimaced with abdominal palpation. EXAM: CT ABDOMEN AND PELVIS WITH CONTRAST TECHNIQUE: Multidetector CT imaging of the abdomen and pelvis was performed using the standard protocol following bolus administration of intravenous contrast. CONTRAST:  168mL ISOVUE-300 IOPAMIDOL (ISOVUE-300) INJECTION 61% COMPARISON:  CT of the abdomen and pelvis 08/31/2017 FINDINGS: Lower chest: The lung bases are clear without focal nodule, mass, or airspace disease. The heart size is normal. Coronary artery calcifications  are again seen. No significant pleural or pericardial effusion is present. Hepatobiliary: No focal liver abnormality is seen. No gallstones, gallbladder wall thickening, or biliary dilatation. Pancreas: Unremarkable. No pancreatic ductal dilatation or surrounding inflammatory changes. Spleen: Normal in size without focal abnormality. Adrenals/Urinary Tract: Adrenal glands are normal bilaterally. The kidneys and ureters are within normal limits bilaterally. A left-sided bladder diverticulum is noted without focal stone or inflammatory change. The urinary bladder is otherwise normal. Stomach/Bowel: The stomach and duodenum are within normal limits. Small  bowel is unremarkable. The appendix is visualized and normal. The ascending and transverse colon are within normal limits. The descending and sigmoid colon are normal. Vascular/Lymphatic: Atherosclerotic calcifications are present in the aorta and branch vessels without aneurysm. No significant adenopathy is present. Reproductive: Prostate is mildly enlarged, measuring 5 cm in transverse diameter. Penile calcifications are again seen. Other: No abdominal wall hernia or abnormality. No abdominopelvic ascites. Musculoskeletal: Remote L1 superior endplate fracture is present. Bilateral L5 pars defects are noted. Grade 1 anterolisthesis is present at L5-S1. The pelvis is intact. The hips are located and within normal limits bilaterally. IMPRESSION: 1. No acute or focal abnormality to explain abdominal pain. 2.  Aortic Atherosclerosis (ICD10-I70.0). 3. Remote L1 compression fracture. 4. Bilateral L5 pars defects. Electronically Signed   By: San Morelle M.D.   On: 09/25/2017 20:52   Dg Chest Portable 1 View  Result Date: 09/25/2017 CLINICAL DATA:  71 year old male with history of weakness. EXAM: PORTABLE CHEST 1 VIEW COMPARISON:  Chest x-ray 08/23/2017. FINDINGS: Study is limited by lack of visualization of the lung bases. With this limitation in mind lung  volumes are normal. No consolidative airspace disease. No definite pleural effusions (small pleural effusions cannot be excluded secondary to exclusion of the lung bases). No pneumothorax. No pulmonary nodule or mass noted. Pulmonary vasculature and the cardiomediastinal silhouette are within normal limits. IMPRESSION: 1. No definite radiographic evidence of acute cardiopulmonary disease. Electronically Signed   By: Vinnie Langton M.D.   On: 09/25/2017 15:01    Procedures Procedures (including critical care time)  Medications Ordered in ED Medications  sodium chloride 0.9 % bolus 500 mL (0 mLs Intravenous Stopped 09/25/17 1555)    Followed by  0.9 %  sodium chloride infusion (100 mL/hr Intravenous New Bag/Given 09/25/17 1500)  ALPRAZolam (XANAX) tablet 0.25 mg (0.25 mg Oral Given 10/62/69 4854)  folic acid (FOLVITE) tablet 1 mg (1 mg Oral Given 09/26/17 1049)  magnesium oxide (MAG-OX) tablet 400 mg (400 mg Oral Given 09/26/17 1049)  pantoprazole (PROTONIX) EC tablet 40 mg (40 mg Oral Given 09/26/17 1050)  thiamine (VITAMIN B-1) tablet 100 mg (100 mg Oral Given 09/26/17 1049)  pravastatin (PRAVACHOL) tablet 20 mg (20 mg Oral Given 09/25/17 2116)  allopurinol (ZYLOPRIM) tablet 100 mg (100 mg Oral Given 09/26/17 1049)  enoxaparin (LOVENOX) injection 40 mg (40 mg Subcutaneous Given 09/25/17 2116)   stroke: mapping our early stages of recovery book (not administered)  0.9 %  sodium chloride infusion ( Intravenous New Bag/Given 09/25/17 2115)  acetaminophen (TYLENOL) tablet 650 mg (not administered)    Or  acetaminophen (TYLENOL) solution 650 mg (not administered)    Or  acetaminophen (TYLENOL) suppository 650 mg (not administered)  senna-docusate (Senokot-S) tablet 1 tablet (not administered)  aspirin suppository 300 mg ( Rectal See Alternative 09/26/17 1049)    Or  aspirin tablet 325 mg (325 mg Oral Given 09/26/17 1049)  cyanocobalamin ((VITAMIN B-12)) injection 1,000 mcg (not  administered)  metoprolol succinate (TOPROL-XL) 24 hr tablet 25 mg (25 mg Oral Given 09/26/17 1049)  sodium chloride 0.9 % bolus 1,000 mL (0 mLs Intravenous Stopped 09/25/17 1942)  iopamidol (ISOVUE-300) 61 % injection 100 mL (100 mLs Intravenous Contrast Given 09/25/17 2029)  LORazepam (ATIVAN) injection 1 mg (1 mg Intravenous Given 09/25/17 2116)  Influenza vac split quadrivalent PF (FLUZONE HIGH-DOSE) injection 0.5 mL (0.5 mLs Intramuscular Given 09/26/17 1050)  sodium chloride 0.9 % bolus 500 mL (0 mLs Intravenous Stopped 09/25/17 2354)     Initial Impression /  Assessment and Plan / ED Course  I have reviewed the triage vital signs and the nursing notes.  Pertinent labs & imaging results that were available during my care of the patient were reviewed by me and considered in my medical decision making (see chart for details).  Clinical Course as of Sep 26 1056  Wed Sep 25, 2017  1647 Labs notable for elevated BUN.  Possible dehydration.   CT scan without acute findings.  Trop elevated but this is the same as previous values.  Doubt clinically significant.  [JK]    Clinical Course User Index [JK] Dorie Rank, MD   Pt presented with altered mental status.  EMS concerned about stroke but outside of TPA window and sx seem more generalized.  ?post ictal.  No focal neuro deficits on my exam.  Appears to have generalized slowing.  Family presented later and mentioned pt has significant mental health problems.  THey question if that can be an issue.  Pt admitted for further workup, consider MRI, possible psych consult.  Final Clinical Impressions(s) / ED Diagnoses   Final diagnoses:  Altered mental status, unspecified altered mental status type    New Prescriptions Current Discharge Medication List       Dorie Rank, MD 09/26/17 1103

## 2017-09-26 NOTE — Progress Notes (Signed)
Initial Nutrition Assessment  DOCUMENTATION CODES:  Not applicable  INTERVENTION:  Please note, pt has lost ~30 lbs or ~13% of his bw in the last month, when he first was admitted with proctitis.   Magic cup BID with meals, each supplement provides 290 kcal and 9 grams of protein cup  NUTRITION DIAGNOSIS:  Unintentional weight loss related to lethargy/confusion, poor appetite, acute illness as evidenced by Loss of 30 lbs, ~13% bw x 67month.  GOAL:  Patient will meet greater than or equal to 90% of their needs  MONITOR:  PO intake, Supplement acceptance, Labs, Weight trends  REASON FOR ASSESSMENT:  Consult "Possible TIA"  ASSESSMENT:  71 y/o male PMHx HTN, HLD, Bipolar Disorder, Anxiety. Recently admitted 9/7-9/13 for proctitis. Had short short stay in interim from now at OSH for refusing to [place]. Presents with AMS/delirium, hallucinations and abdominal pain. Admitted for stroke screen. management/further eval.   Per neurology note, pt with parkinsonism and could potentially have Lewy Body Dementia. B12 was low normal, will be replaced daily now. No acute CVA found on MRI.  Patient alone on RD arrival. Despite some confusion, he answered questions appropriately. He states that at home, his appetite is highly variable " I have good days and bad days". However, on his "bad days", he still will "eat something". Not really able to give any specific quantities.   At home he says he takes a b12? -he may just be reporting what they had just started him on. This is not on his med list.   Denies N/V/C/D. Normally he does take laxatives, but says he stopped taking them a few days ago because he started to have diarrhea. He does not feel constipated right now.   He acknowledges a history of wt loss. Per chart, he has weighed 225 just 1 month ago. He was 230 lbs this past April. Bed weight today shows ~200 lbs, confirming large wt loss. Patient confirms he was weighing 225-230 lbs this past  spring. 12 months ago he weighed 240 lbs.   He is unable to give any reason for his severe wt loss. Suspect it has been related to recent proctitis and subsequent cognitive issues. Apparently he recently had recent brief hospital admission at OSH for refusing to eat or drink.   At this time, he reports a good appetite- "I cleaned my plate" in reference to lunch. Still feel that supplements are appropriate given his size and very large amount of wt loss. He was agreeable.   Given inability to get a good intake history, cannot dx with malnutrition. Potentially has been eating, but has been malabsorbing or has greatly increased energy expenditure  Physical Exam: No discernible muscle/fat wasting  Labs:Na: 130, K: 3.4, Lipid panel/a1c results noted Meds: Xanax, Folate, b12, mag ox, thiamine, ppi, IVF   Recent Labs Lab 09/25/17 1444 09/26/17 1243  NA 131* 130*  K 3.6 3.4*  CL 93* 94*  CO2 26 27  BUN 35* 29*  CREATININE 1.11 1.05  CALCIUM 9.5 8.9  MG  --  1.8  GLUCOSE 103* 83   Diet Order:  Diet Heart Room service appropriate? Yes; Fluid consistency: Thin  Skin: Abrasion to Arm, Ecchymosis to Arm/buttocks   Last BM:  10/10  Height:  Ht Readings from Last 1 Encounters:  09/25/17 6\' 2"  (1.88 m)   Weight:  Wt Readings from Last 1 Encounters:  09/25/17 195 lb 14.4 oz (88.9 kg)   Wt Readings from Last 10 Encounters:  09/25/17 195  lb 14.4 oz (88.9 kg)  08/23/17 224 lb (101.6 kg)  03/25/17 230 lb (104.3 kg)  12/25/16 233 lb (105.7 kg)  09/21/16 240 lb (108.9 kg)  06/18/16 245 lb (111.1 kg)  02/27/16 251 lb (113.9 kg)  11/23/15 249 lb (112.9 kg)  08/16/15 253 lb (114.8 kg)  04/18/15 255 lb (115.7 kg)   Ideal Body Weight:  86.36 kg  BMI:  Body mass index is 25.15 kg/m.  Estimated Nutritional Needs:  Kcal:  2150-2300 (24-26 kcal/kg bw) Protein:  90-105g Pro (1-1.2 g/kg bw) Fluid:  >2.2 L fluid (25 ml/kg bw)  EDUCATION NEEDS:  No education needs identified at this  time  Burtis Junes RD, LDN, [facility] Pager: [phone] 09/26/2017 4:40 PM

## 2017-09-27 DIAGNOSIS — Z23 Encounter for immunization: Secondary | ICD-10-CM | POA: Diagnosis not present

## 2017-09-27 LAB — RPR: RPR Ser Ql: NONREACTIVE

## 2017-09-27 LAB — VITAMIN B1

## 2017-09-27 LAB — ANTINUCLEAR ANTIBODIES, IFA: ANA Ab, IFA: NEGATIVE

## 2017-09-27 MED ORDER — CYANOCOBALAMIN 500 MCG PO TABS: 500.0000 ug | ORAL_TABLET | Freq: Every day | ORAL | Status: AC

## 2017-09-27 NOTE — Discharge Summary (Signed)
Physician Discharge Summary  Daniel Barnett XNT:[id number] DOB: [birth year] DOA: 09/25/2017  PCP: Daniel Pretty, FNP  Admit date: 09/25/2017 Discharge date: 09/27/2017  Admitted From: Home Disposition:  Home  Recommendations for Outpatient Follow-up:  1. Follow up with PCP in 1-2 weeks 2. Please obtain BMP/CBC in one week   Home Health: YES Equipment/Devices: HHPT  Discharge Condition: Stable CODE STATUS: DNR Diet recommendation: Heart Healthy    Brief/Interim Summary: 71 year old male with a history of hypertension, hyperlipidemia, constipation presented with altered mental status. The patient was recently discharged from the hospital after a stay from 08/23/2017 through 08/29/2017 during which the patient was treated for proctitis as well as suicidal ideation. During his hospitalization in September 2018, the patient had a number of psychiatric issues and agitation and belligerence with the medical staff. Ultimately, the patient was cleared by psychiatry to go home.  Since discharge from the hospital on 08/29/2017, the patient has been poorly compliant with his medications according to his family. The patient's daughter stated that patient has become threatening and paranoid at home. The patient has been refusing home health care, and has not been maintaining his own personal hygiene. Apparently, the patient had been refusing to go to bed, refusing PT, and refusing to speak and urinated on himself.  Since discharge from the hospital on 08/29/2017, the patient had a short hospitalization at [facility] for refusing to eat or drink. He was discharged after 24 hours.  On the morning of 09/26/2017, the patient was more conversive and cooperative. He states that for the better part of the week he has been hearing voices and seeing people outside his window that are not existent. In addition, the patient states that he has been having generalized weakness for the past "few days" and  having difficulty getting around. He denies any headache, chest pain, sore staff, nausea, vomiting, diarrhea, abdominal pain. He stated that he had some dysuria, but was recently treated with antibiotics and this is improved.  Discharge Diagnoses:  Delirium/Acute Encephalopathy -I question whether the patient truly has delirium or mental status change -There appears to be significant psychiatric overlay -09/26/17--much improved--A&Ox 3 -08/28/2017 TSH 1.695 -08/28/2017 B12--204 -09/26/2017 MR brain--negative for acute findings, remote lacunar infarct left caudate  -Although my suspicion is low for his low normal B12 level partly contributing to his mental status and weakness, will treat empirically  -Check RPR--NR -HIV negative on 08/26/2017 -Urinalysis negative for pyuria -Check ammonia--10 -Check magnesium --1.8 -EEG--interpreted as normal -consult psychiatry--there is no recommendation to start any psychotropic medication -consult neurology--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 Marcos Eke and to see Dr. Alfonso Patten. Kesean Serviss for parkinsonism -hold off starting sinemet for now -09/25/2017 CT brain negative -B12 injection 1071mcg--received 2 injections during hospitalization -d/c home with B12--500 mcg po daily  Auditory and visual hallucinations -consult psychiatry--there is no recommendation to start any psychotropic medication -cleared for discharge home  Abdominal pain -09/25/2017 CT abdomen and pelvis--remote L1 compression fracture, no acute findings -tolerating diet presently -08/28/2017 colonoscopy--no endoscopic evidence of proctitis or colitis, 3 polyps removed  Essential hypertension -Holding lisinopril secondary to soft blood pressure  -decrease dose of metoprolol succinate -BP improved-->restart after discharge  Hyperlipidemia -Continue statin  Elevated  troponin -no chest pain reported -likely demand ischemia -trend is flat -08/29/2017 echo EF 60-65%, grade 1 DD, no WMA  Hyponatremia -this has been chronic with range of 126-131 -stable  Deconditioning -PT-->home health  PT    Discharge Instructions  Discharge Instructions    Ambulatory referral to Neurology    Complete by:  As directed    Refer to [facility]--Needs to see BOTH Dr. Marcos Eke for formal neuropsychiatric testing and Dr. Carles Collet for Parkinsonism   Diet - low sodium heart healthy    Complete by:  As directed    Increase activity slowly    Complete by:  As directed      Allergies as of 09/27/2017      Reactions   Ciprofloxacin       Medication List    TAKE these medications   allopurinol 100 MG tablet Commonly known as:  ZYLOPRIM Take 1 tablet (100 mg total) by mouth daily.   ALPRAZolam 0.25 MG tablet Commonly known as:  XANAX TAKE 1 TABLET AT BEDTIME  FOR  ANXIETY   cyanocobalamin 500 MCG tablet Take 1 tablet (500 mcg total) by mouth daily. What changed:  medication strength  how much to take   folic acid 443 MCG tablet Commonly known as:  FOLVITE Take 1 tablet (400 mcg total) by mouth daily.   lisinopril 20 MG tablet Commonly known as:  PRINIVIL,ZESTRIL Take 1 tablet (20 mg total) by mouth daily.   magnesium oxide 400 (241.3 Mg) MG tablet Commonly known as:  MAG-OX Take 1 tablet (400 mg total) by mouth daily.   metoprolol succinate 50 MG 24 hr tablet Commonly known as:  TOPROL-XL TAKE 1 TABLET DAILY WITH OR IMMEDIATELY FOLLOWING A MEAL   pantoprazole 40 MG tablet Commonly known as:  PROTONIX Take 1 tablet (40 mg total) by mouth daily.   polyethylene glycol packet Commonly known as:  MIRALAX / GLYCOLAX Take 17 g by mouth daily.   pravastatin 20 MG tablet Commonly known as:  PRAVACHOL TAKE 1 TABLET EVERY DAY   thiamine 100 MG tablet Take 1 tablet (100 mg total) by mouth daily.       Allergies  Allergen Reactions  .  Ciprofloxacin     Consultations:  Psychiatry  neurology   Procedures/Studies: Ct Head Wo Contrast  Result Date: 09/25/2017 CLINICAL DATA:  Altered mental status today. EXAM: CT HEAD WITHOUT CONTRAST TECHNIQUE: Contiguous axial images were obtained from the base of the skull through the vertex without intravenous contrast. COMPARISON:  None. FINDINGS: Brain: Age related cerebral atrophy, ventriculomegaly and periventricular white matter disease. No CT findings for acute hemispheric infarction or intracranial hemorrhage. No extra-axial fluid collections. No mass lesions. The brainstem and cerebellum are grossly normal. Vascular: No hyperdense vessel or unexpected calcification. Skull: No skull fracture or bone lesions. Sinuses/Orbits: The paranasal sinuses and mastoid air cells are clear. The globes are intact. Other: No scalp lesions or hematoma. IMPRESSION: Mild age related changes but no acute process or mass lesion. Electronically Signed   By: Marijo Sanes M.D.   On: 09/25/2017 15:24   Mr Brain Wo Contrast  Result Date: 09/26/2017 CLINICAL DATA:  Altered mental status with confusion and slurred speech. EXAM: MRI HEAD WITHOUT CONTRAST TECHNIQUE: Multiplanar, multiecho pulse sequences of the brain and surrounding structures were obtained without intravenous contrast. COMPARISON:  Head CT from yesterday FINDINGS: Brain: No acute infarction, hemorrhage, hydrocephalus, extra-axial collection or mass lesion. Remote lacunar infarct in the left caudate. Overall mild microvascular ischemic type gliosis in the cerebral white matter. Age normal brain volume. Vascular: Major flow voids are preserved. Skull and upper cervical spine: Negative for marrow lesion Sinuses/Orbits: Negative Other: Intermittent motion degradation, overall mild. IMPRESSION: 1. No  acute finding. 2. Remote lacunar infarct of the left caudate. Electronically Signed   By: Monte Fantasia M.D.   On: 09/26/2017 09:08   Ct Abdomen Pelvis  W Contrast  Result Date: 09/25/2017 CLINICAL DATA:  Encephalopathy. The patient grimaced with abdominal palpation. EXAM: CT ABDOMEN AND PELVIS WITH CONTRAST TECHNIQUE: Multidetector CT imaging of the abdomen and pelvis was performed using the standard protocol following bolus administration of intravenous contrast. CONTRAST:  142mL ISOVUE-300 IOPAMIDOL (ISOVUE-300) INJECTION 61% COMPARISON:  CT of the abdomen and pelvis 08/31/2017 FINDINGS: Lower chest: The lung bases are clear without focal nodule, mass, or airspace disease. The heart size is normal. Coronary artery calcifications are again seen. No significant pleural or pericardial effusion is present. Hepatobiliary: No focal liver abnormality is seen. No gallstones, gallbladder wall thickening, or biliary dilatation. Pancreas: Unremarkable. No pancreatic ductal dilatation or surrounding inflammatory changes. Spleen: Normal in size without focal abnormality. Adrenals/Urinary Tract: Adrenal glands are normal bilaterally. The kidneys and ureters are within normal limits bilaterally. A left-sided bladder diverticulum is noted without focal stone or inflammatory change. The urinary bladder is otherwise normal. Stomach/Bowel: The stomach and duodenum are within normal limits. Small bowel is unremarkable. The appendix is visualized and normal. The ascending and transverse colon are within normal limits. The descending and sigmoid colon are normal. Vascular/Lymphatic: Atherosclerotic calcifications are present in the aorta and branch vessels without aneurysm. No significant adenopathy is present. Reproductive: Prostate is mildly enlarged, measuring 5 cm in transverse diameter. Penile calcifications are again seen. Other: No abdominal wall hernia or abnormality. No abdominopelvic ascites. Musculoskeletal: Remote L1 superior endplate fracture is present. Bilateral L5 pars defects are noted. Grade 1 anterolisthesis is present at L5-S1. The pelvis is intact. The hips are  located and within normal limits bilaterally. IMPRESSION: 1. No acute or focal abnormality to explain abdominal pain. 2.  Aortic Atherosclerosis (ICD10-I70.0). 3. Remote L1 compression fracture. 4. Bilateral L5 pars defects. Electronically Signed   By: San Morelle M.D.   On: 09/25/2017 20:52   Dg Chest Portable 1 View  Result Date: 09/25/2017 CLINICAL DATA:  71 year old male with history of weakness. EXAM: PORTABLE CHEST 1 VIEW COMPARISON:  Chest x-ray 08/23/2017. FINDINGS: Study is limited by lack of visualization of the lung bases. With this limitation in mind lung volumes are normal. No consolidative airspace disease. No definite pleural effusions (small pleural effusions cannot be excluded secondary to exclusion of the lung bases). No pneumothorax. No pulmonary nodule or mass noted. Pulmonary vasculature and the cardiomediastinal silhouette are within normal limits. IMPRESSION: 1. No definite radiographic evidence of acute cardiopulmonary disease. Electronically Signed   By: Vinnie Langton M.D.   On: 09/25/2017 15:01        Discharge Exam: Vitals:   09/27/17 0100 09/27/17 0500  BP: (!) 106/59 124/69  Pulse: (!) 55 67  Resp: 16 16  Temp: 98.1 F (36.7 C) (!) 97.5 F (36.4 C)  SpO2: 98% 99%   Vitals:   09/26/17 1426 09/26/17 2100 09/27/17 0100 09/27/17 0500  BP: 112/61 119/62 (!) 106/59 124/69  Pulse: (!) 59 65 (!) 55 67  Resp: 16 16 16 16   Temp: 98.1 F (36.7 C) 98.2 F (36.8 C) 98.1 F (36.7 C) (!) 97.5 F (36.4 C)  TempSrc: Oral Oral Oral Oral  SpO2: 98% 99% 98% 99%  Weight:      Height:        General: Pt is alert, awake, not in acute distress Cardiovascular: RRR, S1/S2 +, no rubs, no  gallops Respiratory: CTA bilaterally, no wheezing, no rhonchi Abdominal: Soft, NT, ND, bowel sounds + Extremities: no edema, no cyanosis   The results of significant diagnostics from this hospitalization (including imaging, microbiology, ancillary and laboratory) are listed  below for reference.    Significant Diagnostic Studies: Ct Head Wo Contrast  Result Date: 09/25/2017 CLINICAL DATA:  Altered mental status today. EXAM: CT HEAD WITHOUT CONTRAST TECHNIQUE: Contiguous axial images were obtained from the base of the skull through the vertex without intravenous contrast. COMPARISON:  None. FINDINGS: Brain: Age related cerebral atrophy, ventriculomegaly and periventricular white matter disease. No CT findings for acute hemispheric infarction or intracranial hemorrhage. No extra-axial fluid collections. No mass lesions. The brainstem and cerebellum are grossly normal. Vascular: No hyperdense vessel or unexpected calcification. Skull: No skull fracture or bone lesions. Sinuses/Orbits: The paranasal sinuses and mastoid air cells are clear. The globes are intact. Other: No scalp lesions or hematoma. IMPRESSION: Mild age related changes but no acute process or mass lesion. Electronically Signed   By: Marijo Sanes M.D.   On: 09/25/2017 15:24   Mr Brain Wo Contrast  Result Date: 09/26/2017 CLINICAL DATA:  Altered mental status with confusion and slurred speech. EXAM: MRI HEAD WITHOUT CONTRAST TECHNIQUE: Multiplanar, multiecho pulse sequences of the brain and surrounding structures were obtained without intravenous contrast. COMPARISON:  Head CT from yesterday FINDINGS: Brain: No acute infarction, hemorrhage, hydrocephalus, extra-axial collection or mass lesion. Remote lacunar infarct in the left caudate. Overall mild microvascular ischemic type gliosis in the cerebral white matter. Age normal brain volume. Vascular: Major flow voids are preserved. Skull and upper cervical spine: Negative for marrow lesion Sinuses/Orbits: Negative Other: Intermittent motion degradation, overall mild. IMPRESSION: 1. No acute finding. 2. Remote lacunar infarct of the left caudate. Electronically Signed   By: Monte Fantasia M.D.   On: 09/26/2017 09:08   Ct Abdomen Pelvis W Contrast  Result Date:  09/25/2017 CLINICAL DATA:  Encephalopathy. The patient grimaced with abdominal palpation. EXAM: CT ABDOMEN AND PELVIS WITH CONTRAST TECHNIQUE: Multidetector CT imaging of the abdomen and pelvis was performed using the standard protocol following bolus administration of intravenous contrast. CONTRAST:  171mL ISOVUE-300 IOPAMIDOL (ISOVUE-300) INJECTION 61% COMPARISON:  CT of the abdomen and pelvis 08/31/2017 FINDINGS: Lower chest: The lung bases are clear without focal nodule, mass, or airspace disease. The heart size is normal. Coronary artery calcifications are again seen. No significant pleural or pericardial effusion is present. Hepatobiliary: No focal liver abnormality is seen. No gallstones, gallbladder wall thickening, or biliary dilatation. Pancreas: Unremarkable. No pancreatic ductal dilatation or surrounding inflammatory changes. Spleen: Normal in size without focal abnormality. Adrenals/Urinary Tract: Adrenal glands are normal bilaterally. The kidneys and ureters are within normal limits bilaterally. A left-sided bladder diverticulum is noted without focal stone or inflammatory change. The urinary bladder is otherwise normal. Stomach/Bowel: The stomach and duodenum are within normal limits. Small bowel is unremarkable. The appendix is visualized and normal. The ascending and transverse colon are within normal limits. The descending and sigmoid colon are normal. Vascular/Lymphatic: Atherosclerotic calcifications are present in the aorta and branch vessels without aneurysm. No significant adenopathy is present. Reproductive: Prostate is mildly enlarged, measuring 5 cm in transverse diameter. Penile calcifications are again seen. Other: No abdominal wall hernia or abnormality. No abdominopelvic ascites. Musculoskeletal: Remote L1 superior endplate fracture is present. Bilateral L5 pars defects are noted. Grade 1 anterolisthesis is present at L5-S1. The pelvis is intact. The hips are located and within normal  limits bilaterally. IMPRESSION: 1.  No acute or focal abnormality to explain abdominal pain. 2.  Aortic Atherosclerosis (ICD10-I70.0). 3. Remote L1 compression fracture. 4. Bilateral L5 pars defects. Electronically Signed   By: San Morelle M.D.   On: 09/25/2017 20:52   Dg Chest Portable 1 View  Result Date: 09/25/2017 CLINICAL DATA:  71 year old male with history of weakness. EXAM: PORTABLE CHEST 1 VIEW COMPARISON:  Chest x-ray 08/23/2017. FINDINGS: Study is limited by lack of visualization of the lung bases. With this limitation in mind lung volumes are normal. No consolidative airspace disease. No definite pleural effusions (small pleural effusions cannot be excluded secondary to exclusion of the lung bases). No pneumothorax. No pulmonary nodule or mass noted. Pulmonary vasculature and the cardiomediastinal silhouette are within normal limits. IMPRESSION: 1. No definite radiographic evidence of acute cardiopulmonary disease. Electronically Signed   By: Vinnie Langton M.D.   On: 09/25/2017 15:01     Microbiology: No results found for this or any previous visit (from the past 240 hour(s)).   Labs: Basic Metabolic Panel:  Recent Labs Lab 09/25/17 1444 09/26/17 1243  NA 131* 130*  K 3.6 3.4*  CL 93* 94*  CO2 26 27  GLUCOSE 103* 83  BUN 35* 29*  CREATININE 1.11 1.05  CALCIUM 9.5 8.9  MG  --  1.8   Liver Function Tests:  Recent Labs Lab 09/25/17 1444  AST 33  ALT 31  ALKPHOS 64  BILITOT 1.4*  PROT 7.5  ALBUMIN 4.0   No results for input(s): LIPASE, AMYLASE in the last 168 hours.  Recent Labs Lab 09/25/17 1444  AMMONIA 10   CBC:  Recent Labs Lab 09/25/17 1440  WBC 7.9  NEUTROABS 6.0  HGB 15.5  HCT 43.2  MCV 91.1  PLT 217   Cardiac Enzymes:  Recent Labs Lab 09/25/17 1440 09/25/17 2055 09/26/17 0253 09/26/17 0856  TROPONINI 0.04* 0.04* 0.03* 0.03*   BNP: Invalid input(s): POCBNP CBG: No results for input(s): GLUCAP in the last 168  hours.  Time coordinating discharge:  Greater than 30 minutes  Signed:  Malin Sambrano, DO [facility] Pager: [phone] 09/27/2017, 10:14 AM

## 2017-09-27 NOTE — Progress Notes (Signed)
Occupational Therapy Treatment Patient Details Name: Daniel Barnett MRN: [id number] DOB: [birth year] Today's Date: 09/27/2017    History of present illness  Daniel Barnett is a 71 y.o. male with medical history significant of HTN, HLD, bipolar and recent admission for proctitis wtih prolonged admission from 9/7-13 due to psychiatric overlay presenting with AMS.  Initially the patient was unaccompanied and was able to contribute very little history in one word answers to questions.  "Sick."  Head spinning.  All night.  A little bit normal yesterday.  +headache.  +visual changes.  +dysphagia.    OT comments  Pt received supine in bed, easily awakened on OT arrival. Pt sleepy but willing to participate in OT Treatment. This am, pt requiring supervision/min guard for ADL tasks in standing as well as functional mobility. Pt unwilling to participate in exercises this am due to feeling "drowsy." Discharge plan remains appropriate.     Follow Up Recommendations  No OT follow up;Supervision/Assistance - 24 hour    Equipment Recommendations  None recommended by OT       Precautions / Restrictions Precautions Precautions: Fall       Mobility Bed Mobility Overal bed mobility: Needs Assistance Bed Mobility: Supine to Sit     Supine to sit: Supervision        Transfers Overall transfer level: Needs assistance Equipment used: None Transfers: Sit to/from Stand Sit to Stand: Min guard                  ADL either performed or assessed with clinical judgement   ADL Overall ADL's : Needs assistance/impaired     Grooming: Wash/dry hands;Supervision/safety;Standing                   Toilet Transfer: Supervision/safety;Ambulation;Regular Toilet;Grab bars   Toileting- Clothing Manipulation and Hygiene: Supervision/safety;Sit to/from stand       Functional mobility during ADLs: Supervision/safety;Min guard General ADL Comments: Increased time and supervision for ADL  completion               Cognition Arousal/Alertness: Awake/alert Behavior During Therapy: WFL for tasks assessed/performed Overall Cognitive Status: Within Functional Limits for tasks assessed                                                     Pertinent Vitals/ Pain       Pain Assessment: No/denies pain         Frequency  Min 2X/week        Progress Toward Goals  OT Goals(current goals can now be found in the care plan section)  Progress towards OT goals: Progressing toward goals  ADL Goals Pt Will Perform Grooming: with modified independence;standing Pt Will Perform Lower Body Dressing: with modified independence;sitting/lateral leans Pt Will Transfer to Toilet: with modified independence;ambulating;regular height toilet Pt/caregiver will Perform Home Exercise Program: Increased strength;Both right and left upper extremity;With written HEP provided  Plan Discharge plan remains appropriate          End of Session Equipment Utilized During Treatment: Gait belt  OT Visit Diagnosis: Muscle weakness (generalized) (M62.81)   Activity Tolerance Patient tolerated treatment well   Patient Left in chair;with call bell/phone within reach   Nurse Communication          Time: 0812-0829 OT Time Calculation (min): 17 min  Charges:  OT General Charges $OT Visit: 1 Visit OT Treatments $Self Care/Home Management : 8-22 mins    Guadelupe Sabin, OTR/L  [phone] 09/27/2017, 8:32 AM

## 2017-09-27 NOTE — Care Management Important Message (Signed)
Important Message  Patient Details  Name: Daniel Barnett MRN: [id number] Date of Birth: 02-20-1946   Medicare Important Message Given:  Yes    Sherald Barge, RN 09/27/2017, 10:24 AM

## 2017-09-27 NOTE — Care Management Note (Addendum)
Case Management Note  Patient Details  Name: TAJE LITTLER MRN: [id number] Date of Birth: 07-Jun-1946  Subjective/Objective:                  Pt admitted with encephalopathy, from home, has dtr for support, has neighbor living with him for support also. Pt active with [facility] for PT services pta. Pt agreeable to resuming services at DC. Aware [facility] has 48 hrs to resume services.   Action/Plan: DC home today. [facility] rep, Vaughan Basta, make aware of DC and will pull resumption orders from chart. Pt says his dtr will provide transportation home. Pt referred for emmi transition calls. Pt made aware and flyer provided with DC information.    Expected Discharge Date:  09/27/17               Expected Discharge Plan:  [place]  In-House Referral:  NA  Discharge planning Services  CM Consult  Post Acute Care Choice:  Home Health, Resumption of Svcs/PTA Provider Choice offered to:  Patient  HH Arranged:  PT [facility] Agency:  [place]  Status of Service:  Completed, signed off  Sherald Barge, RN 09/27/2017, 10:22 AM

## 2017-09-27 NOTE — Clinical Social Work Note (Signed)
Patient referred to CSW for placement. Patient recommended for HHPT/supervision 24 hour. Patient has 24 hour supervision.   LCSW signing off.     Thaddaeus Granja, Clydene Pugh, LCSW

## 2017-09-30 ENCOUNTER — Other Ambulatory Visit: Payer: Self-pay | Admitting: *Deleted

## 2017-09-30 DIAGNOSIS — K6289 Other specified diseases of anus and rectum: Secondary | ICD-10-CM | POA: Diagnosis not present

## 2017-09-30 DIAGNOSIS — I10 Essential (primary) hypertension: Secondary | ICD-10-CM | POA: Diagnosis not present

## 2017-09-30 DIAGNOSIS — Z87440 Personal history of urinary (tract) infections: Secondary | ICD-10-CM | POA: Diagnosis not present

## 2017-09-30 DIAGNOSIS — M6281 Muscle weakness (generalized): Secondary | ICD-10-CM | POA: Diagnosis not present

## 2017-09-30 DIAGNOSIS — Z87891 Personal history of nicotine dependence: Secondary | ICD-10-CM | POA: Diagnosis not present

## 2017-09-30 DIAGNOSIS — E871 Hypo-osmolality and hyponatremia: Secondary | ICD-10-CM | POA: Diagnosis not present

## 2017-09-30 DIAGNOSIS — K5900 Constipation, unspecified: Secondary | ICD-10-CM | POA: Diagnosis not present

## 2017-09-30 LAB — HOMOCYSTEINE: Homocysteine: 25.7 umol/L — ABNORMAL HIGH (ref 0.0–15.0)

## 2017-10-01 NOTE — Patient Outreach (Signed)
Daniel Barnett) Care Management  10/01/2017  Daniel Barnett 22-Jan-1946 [id number]   EMMI-General Discharge -Red Alert Day#1, 09/28/2017 Reason: Donney Rankins prescription-yes  Telephone call to patient who was advised of reason for call.  HIPPA verification received. Patient advised that his caregiver/best friend would talk for him.  Darrell Zenia Resides voices that he is patient's caregiver & best friend. States he lives with patient & cares for him.   Voices that he answered the automated calls and advised that he manages patient's medications & all prescriptions have been filled. States he makes sure that patient gets medication as instructed by MD.   States he has made follow up appointment for patient & they will take "[facility]" transportation to get there on 10/11/2017.  States he will not be able to take him to out of town appointments with specialists on [facility].  States he will make other transportation arrangements for out of town appointments.   Caregiver was advised to call [employer] customer services  to get information about  arrangements for transportation to out of town appointments after he gets them scheduled.   States he has  patient's insurance card & will call customer service to get more information on the transportation services.   Caregiver voices that patient has had several diarrhea stools in the last 2 days that usually occur during the night. Advised caregiver to advise MD office if condition does not improve. States he would. States patient is eating & drinking well. Voices that he is getting around in home & has not had any falls. Advised of falls prevention.  States home physical therapy started Monday & they will return on Wednesday.   States no further concerns at this time. EMMI red alert addressed.  Plan: Send to care management assistant to close case.   Sherrin Daisy, RN BSN [place] Management Coordinator [facility] Care Management  [phone]

## 2017-10-02 DIAGNOSIS — Z87891 Personal history of nicotine dependence: Secondary | ICD-10-CM | POA: Diagnosis not present

## 2017-10-02 DIAGNOSIS — I10 Essential (primary) hypertension: Secondary | ICD-10-CM | POA: Diagnosis not present

## 2017-10-02 DIAGNOSIS — M6281 Muscle weakness (generalized): Secondary | ICD-10-CM | POA: Diagnosis not present

## 2017-10-02 DIAGNOSIS — Z87440 Personal history of urinary (tract) infections: Secondary | ICD-10-CM | POA: Diagnosis not present

## 2017-10-02 DIAGNOSIS — K5900 Constipation, unspecified: Secondary | ICD-10-CM | POA: Diagnosis not present

## 2017-10-02 DIAGNOSIS — E871 Hypo-osmolality and hyponatremia: Secondary | ICD-10-CM | POA: Diagnosis not present

## 2017-10-02 DIAGNOSIS — K6289 Other specified diseases of anus and rectum: Secondary | ICD-10-CM | POA: Diagnosis not present

## 2017-10-08 DIAGNOSIS — Z87440 Personal history of urinary (tract) infections: Secondary | ICD-10-CM | POA: Diagnosis not present

## 2017-10-08 DIAGNOSIS — Z87891 Personal history of nicotine dependence: Secondary | ICD-10-CM | POA: Diagnosis not present

## 2017-10-08 DIAGNOSIS — I10 Essential (primary) hypertension: Secondary | ICD-10-CM | POA: Diagnosis not present

## 2017-10-08 DIAGNOSIS — E871 Hypo-osmolality and hyponatremia: Secondary | ICD-10-CM | POA: Diagnosis not present

## 2017-10-08 DIAGNOSIS — M6281 Muscle weakness (generalized): Secondary | ICD-10-CM | POA: Diagnosis not present

## 2017-10-08 DIAGNOSIS — K5900 Constipation, unspecified: Secondary | ICD-10-CM | POA: Diagnosis not present

## 2017-10-08 DIAGNOSIS — K6289 Other specified diseases of anus and rectum: Secondary | ICD-10-CM | POA: Diagnosis not present

## 2017-10-08 NOTE — Progress Notes (Signed)
Patient has upcoming appt. Will discuss with patient at that time

## 2017-10-09 DIAGNOSIS — K6289 Other specified diseases of anus and rectum: Secondary | ICD-10-CM | POA: Diagnosis not present

## 2017-10-09 DIAGNOSIS — I10 Essential (primary) hypertension: Secondary | ICD-10-CM | POA: Diagnosis not present

## 2017-10-09 DIAGNOSIS — E871 Hypo-osmolality and hyponatremia: Secondary | ICD-10-CM | POA: Diagnosis not present

## 2017-10-09 DIAGNOSIS — M6281 Muscle weakness (generalized): Secondary | ICD-10-CM | POA: Diagnosis not present

## 2017-10-09 DIAGNOSIS — Z87440 Personal history of urinary (tract) infections: Secondary | ICD-10-CM | POA: Diagnosis not present

## 2017-10-09 DIAGNOSIS — Z87891 Personal history of nicotine dependence: Secondary | ICD-10-CM | POA: Diagnosis not present

## 2017-10-09 DIAGNOSIS — K5900 Constipation, unspecified: Secondary | ICD-10-CM | POA: Diagnosis not present

## 2017-10-10 DIAGNOSIS — M6281 Muscle weakness (generalized): Secondary | ICD-10-CM | POA: Diagnosis not present

## 2017-10-10 DIAGNOSIS — Z87440 Personal history of urinary (tract) infections: Secondary | ICD-10-CM | POA: Diagnosis not present

## 2017-10-10 DIAGNOSIS — E871 Hypo-osmolality and hyponatremia: Secondary | ICD-10-CM | POA: Diagnosis not present

## 2017-10-10 DIAGNOSIS — Z87891 Personal history of nicotine dependence: Secondary | ICD-10-CM | POA: Diagnosis not present

## 2017-10-10 DIAGNOSIS — K6289 Other specified diseases of anus and rectum: Secondary | ICD-10-CM | POA: Diagnosis not present

## 2017-10-10 DIAGNOSIS — K5900 Constipation, unspecified: Secondary | ICD-10-CM | POA: Diagnosis not present

## 2017-10-10 DIAGNOSIS — I10 Essential (primary) hypertension: Secondary | ICD-10-CM | POA: Diagnosis not present

## 2017-10-11 ENCOUNTER — Emergency Department (HOSPITAL_COMMUNITY): Payer: Medicare HMO

## 2017-10-11 ENCOUNTER — Ambulatory Visit: Payer: Medicare HMO | Admitting: Nurse Practitioner

## 2017-10-11 ENCOUNTER — Encounter (HOSPITAL_COMMUNITY): Payer: Self-pay | Admitting: Emergency Medicine

## 2017-10-11 ENCOUNTER — Emergency Department (HOSPITAL_COMMUNITY)
Admission: EM | Admit: 2017-10-11 | Discharge: 2017-10-11 | Disposition: A | Discharge status: Home / Self Care | Payer: Medicare HMO | Attending: Emergency Medicine | Admitting: Emergency Medicine

## 2017-10-11 DIAGNOSIS — R197 Diarrhea, unspecified: Secondary | ICD-10-CM | POA: Insufficient documentation

## 2017-10-11 DIAGNOSIS — F17210 Nicotine dependence, cigarettes, uncomplicated: Secondary | ICD-10-CM | POA: Diagnosis not present

## 2017-10-11 DIAGNOSIS — R1084 Generalized abdominal pain: Secondary | ICD-10-CM | POA: Diagnosis not present

## 2017-10-11 DIAGNOSIS — K5900 Constipation, unspecified: Secondary | ICD-10-CM | POA: Insufficient documentation

## 2017-10-11 DIAGNOSIS — Z79899 Other long term (current) drug therapy: Secondary | ICD-10-CM | POA: Diagnosis not present

## 2017-10-11 DIAGNOSIS — R109 Unspecified abdominal pain: Secondary | ICD-10-CM | POA: Insufficient documentation

## 2017-10-11 DIAGNOSIS — I10 Essential (primary) hypertension: Secondary | ICD-10-CM | POA: Diagnosis not present

## 2017-10-11 DIAGNOSIS — R110 Nausea: Secondary | ICD-10-CM

## 2017-10-11 DIAGNOSIS — R9431 Abnormal electrocardiogram [ECG] [EKG]: Secondary | ICD-10-CM | POA: Diagnosis not present

## 2017-10-11 DIAGNOSIS — R279 Unspecified lack of coordination: Secondary | ICD-10-CM | POA: Diagnosis not present

## 2017-10-11 DIAGNOSIS — Z743 Need for continuous supervision: Secondary | ICD-10-CM | POA: Diagnosis not present

## 2017-10-11 DIAGNOSIS — J449 Chronic obstructive pulmonary disease, unspecified: Secondary | ICD-10-CM | POA: Diagnosis not present

## 2017-10-11 DIAGNOSIS — R404 Transient alteration of awareness: Secondary | ICD-10-CM | POA: Diagnosis not present

## 2017-10-11 DIAGNOSIS — R531 Weakness: Secondary | ICD-10-CM | POA: Diagnosis not present

## 2017-10-11 DIAGNOSIS — K5730 Diverticulosis of large intestine without perforation or abscess without bleeding: Secondary | ICD-10-CM | POA: Diagnosis not present

## 2017-10-11 LAB — CBC WITH DIFFERENTIAL/PLATELET
Basophils Absolute: 0 10*3/uL (ref 0.0–0.1)
Basophils Relative: 0 %
EOS ABS: 0.1 10*3/uL (ref 0.0–0.7)
EOS PCT: 1 %
HCT: 41.1 % (ref 39.0–52.0)
HEMOGLOBIN: 14.6 g/dL (ref 13.0–17.0)
LYMPHS PCT: 15 %
Lymphs Abs: 1.3 10*3/uL (ref 0.7–4.0)
MCH: 32.2 pg (ref 26.0–34.0)
MCHC: 35.5 g/dL (ref 30.0–36.0)
MCV: 90.5 fL (ref 78.0–100.0)
MONOS PCT: 10 %
Monocytes Absolute: 0.9 10*3/uL (ref 0.1–1.0)
NEUTROS PCT: 74 %
Neutro Abs: 6.6 10*3/uL (ref 1.7–7.7)
PLATELETS: 212 10*3/uL (ref 150–400)
RBC: 4.54 MIL/uL (ref 4.22–5.81)
RDW: 11.9 % (ref 11.5–15.5)
WBC: 8.8 10*3/uL (ref 4.0–10.5)

## 2017-10-11 LAB — URINALYSIS, ROUTINE W REFLEX MICROSCOPIC
Bilirubin Urine: NEGATIVE
Glucose, UA: NEGATIVE mg/dL
Hgb urine dipstick: NEGATIVE
KETONES UR: NEGATIVE mg/dL
LEUKOCYTES UA: NEGATIVE
NITRITE: NEGATIVE
PH: 5 (ref 5.0–8.0)
Protein, ur: NEGATIVE mg/dL
Specific Gravity, Urine: 1.016 (ref 1.005–1.030)

## 2017-10-11 LAB — LACTIC ACID, PLASMA
LACTIC ACID, VENOUS: 1.1 mmol/L (ref 0.5–1.9)
Lactic Acid, Venous: 1.1 mmol/L (ref 0.5–1.9)

## 2017-10-11 LAB — COMPREHENSIVE METABOLIC PANEL
ALBUMIN: 3.6 g/dL (ref 3.5–5.0)
ALK PHOS: 62 U/L (ref 38–126)
ALT: 22 U/L (ref 17–63)
AST: 22 U/L (ref 15–41)
Anion gap: 10 (ref 5–15)
BILIRUBIN TOTAL: 1.1 mg/dL (ref 0.3–1.2)
BUN: 39 mg/dL — AB (ref 6–20)
CALCIUM: 9.4 mg/dL (ref 8.9–10.3)
CO2: 28 mmol/L (ref 22–32)
Chloride: 92 mmol/L — ABNORMAL LOW (ref 101–111)
Creatinine, Ser: 1.57 mg/dL — ABNORMAL HIGH (ref 0.61–1.24)
GFR calc Af Amer: 50 mL/min — ABNORMAL LOW (ref >60)
GFR calc non Af Amer: 43 mL/min — ABNORMAL LOW (ref >60)
GLUCOSE: 101 mg/dL — AB (ref 65–99)
Potassium: 4.1 mmol/L (ref 3.5–5.1)
SODIUM: 130 mmol/L — AB (ref 135–145)
TOTAL PROTEIN: 7 g/dL (ref 6.5–8.1)

## 2017-10-11 LAB — LIPASE, BLOOD: Lipase: 33 U/L (ref 11–51)

## 2017-10-11 LAB — TROPONIN I: Troponin I: 0.03 ng/mL (ref <0.03)

## 2017-10-11 MED ORDER — MAGNESIUM HYDROXIDE 400 MG/5ML PO SUSP
30.0000 mL | Freq: Once | ORAL | Status: AC
  Administered 2017-10-11: 30 mL via ORAL
  Filled 2017-10-11: qty 30

## 2017-10-11 MED ORDER — MORPHINE SULFATE (PF) 2 MG/ML IV SOLN: 2.0000 mg | INTRAVENOUS | Status: DC | PRN

## 2017-10-11 MED ORDER — SODIUM CHLORIDE 0.9 % IV BOLUS (SEPSIS)
500.0000 mL | Freq: Once | INTRAVENOUS | Status: AC
  Administered 2017-10-11: 500 mL via INTRAVENOUS

## 2017-10-11 MED ORDER — SODIUM CHLORIDE 0.9 % IV SOLN
INTRAVENOUS | Status: DC
  Administered 2017-10-11: 100 mL/h via INTRAVENOUS

## 2017-10-11 MED ORDER — ONDANSETRON HCL 4 MG/2ML IJ SOLN: 4.0000 mg | INTRAMUSCULAR | Status: DC | PRN

## 2017-10-11 NOTE — ED Notes (Signed)
Patient reported that he wasn't able to give a urine sample, and that he doesn't mind being catheterized.

## 2017-10-11 NOTE — ED Provider Notes (Signed)
[facility] EMERGENCY DEPARTMENT Provider Note   CSN: [id number] Arrival date & time: 10/11/17  1146     History   Chief Complaint Chief Complaint  Patient presents with  . Diarrhea    HPI Daniel Barnett is a 71 y.o. male.   Diarrhea      Pt was seen at 1230.  Per pt, c/o gradual onset and persistence of constant generalized abd "pain" for the past 2 weeks.  Has been associated with nausea and multiple intermittent episodes of diarrhea.  Describes the abd pain as "cramping."  Denies diarrhea, no fevers, no back pain, no rash, no CP/SOB, no black or blood in stools or emesis.      Past Medical History:  Diagnosis Date  . Anxiety   . Gout   . Hyperlipidemia   . Hypertension   . Psoriasis     Patient Active Problem List   Diagnosis Date Noted  . Delirium 09/26/2017  . Auditory hallucination 09/26/2017  . Elevated troponin 09/26/2017  . Encephalopathy 09/25/2017  . Abdominal pain   . Hyponatremia   . Proctitis 08/23/2017  . CN (constipation) 04/18/2015  . Insomnia 04/02/2014  . BMI 30.0-30.9,adult 04/02/2014  . Essential hypertension, benign 04/13/2013  . Hyperlipidemia with target LDL less than 100 04/13/2013    Past Surgical History:  Procedure Laterality Date  . COLONOSCOPY WITH PROPOFOL N/A 08/28/2017   Procedure: COLONOSCOPY WITH PROPOFOL;  Surgeon: Daneil Dolin, MD;  Location: [facility];  Service: Gastroenterology;  Laterality: N/A;  . POLYPECTOMY  08/28/2017   Procedure: POLYPECTOMY;  Surgeon: Daneil Dolin, MD;  Location: [facility];  Service: Gastroenterology;;  polyp at ascending colon x2, descending colon polyp  . TONSILLECTOMY    . VASECTOMY         Home Medications    Prior to Admission medications   Medication Sig Start Date End Date Taking? Authorizing Provider  allopurinol (ZYLOPRIM) 100 MG tablet Take 1 tablet (100 mg total) by mouth daily. 12/25/16  Yes Hassell Done, Mary-Margaret, FNP  ALPRAZolam Duanne Moron) 0.25 MG tablet TAKE 1  TABLET AT BEDTIME  FOR  ANXIETY 09/19/17  Yes Hawks, Christy A, FNP  cyanocobalamin 500 MCG tablet Take 1 tablet (500 mcg total) by mouth daily. 09/27/17  Yes Tat, Shanon Brow, MD  folic acid (FOLVITE) 510 MCG tablet Take 1 tablet (400 mcg total) by mouth daily. 08/29/17 08/29/18 Yes Reyne Dumas, MD  lisinopril (PRINIVIL,ZESTRIL) 20 MG tablet Take 1 tablet (20 mg total) by mouth daily. 08/28/17 08/28/18 Yes Reyne Dumas, MD  magnesium oxide (MAG-OX) 400 (241.3 Mg) MG tablet Take 1 tablet (400 mg total) by mouth daily. 08/28/17  Yes Reyne Dumas, MD  metoprolol succinate (TOPROL-XL) 50 MG 24 hr tablet TAKE 1 TABLET DAILY WITH OR IMMEDIATELY FOLLOWING A MEAL 05/22/17  Yes Chipper Herb, MD  pantoprazole (PROTONIX) 40 MG tablet Take 1 tablet (40 mg total) by mouth daily. 08/28/17  Yes Reyne Dumas, MD  polyethylene glycol (MIRALAX / GLYCOLAX) packet Take 17 g by mouth daily. 08/30/17  Yes Reyne Dumas, MD  pravastatin (PRAVACHOL) 20 MG tablet TAKE 1 TABLET EVERY DAY 05/22/17  Yes Chipper Herb, MD  thiamine 100 MG tablet Take 1 tablet (100 mg total) by mouth daily. 08/29/17  Yes Reyne Dumas, MD    Family History Family History  Problem Relation Age of Onset  . Colon cancer Neg Hx   . Colon polyps Neg Hx     Social History Social History  Substance  Use Topics  . Smoking status: Current Every Day Smoker    Types: Cigarettes  . Smokeless tobacco: Never Used     Comment: 1/2 to 1 pack a day   . Alcohol use Yes     Comment: Four 12 ounces of beer a day     Allergies   Ciprofloxacin   Review of Systems Review of Systems  Gastrointestinal: Positive for diarrhea.  ROS: Statement: All systems negative except as marked or noted in the HPI; Constitutional: Negative for fever and chills. ; ; Eyes: Negative for eye pain, redness and discharge. ; ; ENMT: Negative for ear pain, hoarseness, nasal congestion, sinus pressure and sore throat. ; ; Cardiovascular: Negative for chest pain, palpitations,  diaphoresis, dyspnea and peripheral edema. ; ; Respiratory: Negative for cough, wheezing and stridor. ; ; Gastrointestinal: +nausea, diarrhea, abd pain. Negative for vomiting, blood in stool, hematemesis, jaundice and rectal bleeding. . ; ; Genitourinary: Negative for dysuria, flank pain and hematuria. ; ; Musculoskeletal: Negative for back pain and neck pain. Negative for swelling and trauma.; ; Skin: Negative for pruritus, rash, abrasions, blisters, bruising and skin lesion.; ; Neuro: Negative for headache, lightheadedness and neck stiffness. Negative for weakness, altered level of consciousness, altered mental status, extremity weakness, paresthesias, involuntary movement, seizure and syncope.        Physical Exam Updated Vital Signs BP 124/87   Pulse (!) 101   Temp 97.6 F (36.4 C) (Oral)   Resp 16   Ht 6\' 1"  (1.854 m)   Wt 88.5 kg (195 lb)   SpO2 99%   BMI 25.73 kg/m   Physical Exam 1235: Physical examination:  Nursing notes reviewed; Vital signs and O2 SAT reviewed;  Constitutional: Well developed, Well nourished, Well hydrated, In no acute distress; Head:  Normocephalic, atraumatic; Eyes: EOMI, PERRL, No scleral icterus; ENMT: Mouth and pharynx normal, Mucous membranes moist; Neck: Supple, Full range of motion, No lymphadenopathy; Cardiovascular: Regular rate and rhythm, No gallop; Respiratory: Breath sounds clear & equal bilaterally, No wheezes.  Speaking full sentences with ease, Normal respiratory effort/excursion; Chest: Nontender, Movement normal; Abdomen: Soft, +diffuse tenderness to palp. No rebound or guarding. Nondistended, Normal bowel sounds. Rectal exam performed w/permission of pt and ED Tech chaperone present.  Anal tone normal.  Non-tender, large amount of soft brown stool in rectal vault. No hard stool. No fissures, no external hemorrhoids, no palp masses.; Genitourinary: No CVA tenderness; Extremities: Pulses normal, No tenderness, No edema, No calf edema or asymmetry.;  Neuro: AA&Ox3, tangential historian. Major CN grossly intact. No facial droop. Speech clear. No gross focal motor or sensory deficits in extremities.; Skin: Color normal, Warm, Dry.   ED Treatments / Results  Labs (all labs ordered are listed, but only abnormal results are displayed)   EKG  EKG Interpretation None       Radiology   Procedures Procedures (including critical care time)  Medications Ordered in ED Medications - No data to display   Initial Impression / Assessment and Plan / ED Course  I have reviewed the triage vital signs and the nursing notes.  Pertinent labs & imaging results that were available during my care of the patient were reviewed by me and considered in my medical decision making (see chart for details).  MDM Reviewed: previous chart, nursing note and vitals Reviewed previous: labs and ECG Interpretation: labs, ECG and CT scan   Results for orders placed or performed during the hospital encounter of 10/11/17  Comprehensive metabolic panel  Result  Value Ref Range   Sodium 130 (L) 135 - 145 mmol/L   Potassium 4.1 3.5 - 5.1 mmol/L   Chloride 92 (L) 101 - 111 mmol/L   CO2 28 22 - 32 mmol/L   Glucose, Bld 101 (H) 65 - 99 mg/dL   BUN 39 (H) 6 - 20 mg/dL   Creatinine, Ser 1.57 (H) 0.61 - 1.24 mg/dL   Calcium 9.4 8.9 - 10.3 mg/dL   Total Protein 7.0 6.5 - 8.1 g/dL   Albumin 3.6 3.5 - 5.0 g/dL   AST 22 15 - 41 U/L   ALT 22 17 - 63 U/L   Alkaline Phosphatase 62 38 - 126 U/L   Total Bilirubin 1.1 0.3 - 1.2 mg/dL   GFR calc non Af Amer 43 (L) >60 mL/min   GFR calc Af Amer 50 (L) >60 mL/min   Anion gap 10 5 - 15  Troponin I  Result Value Ref Range   Troponin I 0.03 (HH) <0.03 ng/mL  Lipase, blood  Result Value Ref Range   Lipase 33 11 - 51 U/L  CBC with Differential  Result Value Ref Range   WBC 8.8 4.0 - 10.5 K/uL   RBC 4.54 4.22 - 5.81 MIL/uL   Hemoglobin 14.6 13.0 - 17.0 g/dL   HCT 41.1 39.0 - 52.0 %   MCV 90.5 78.0 - 100.0 fL   MCH  32.2 26.0 - 34.0 pg   MCHC 35.5 30.0 - 36.0 g/dL   RDW 11.9 11.5 - 15.5 %   Platelets 212 150 - 400 K/uL   Neutrophils Relative % 74 %   Neutro Abs 6.6 1.7 - 7.7 K/uL   Lymphocytes Relative 15 %   Lymphs Abs 1.3 0.7 - 4.0 K/uL   Monocytes Relative 10 %   Monocytes Absolute 0.9 0.1 - 1.0 K/uL   Eosinophils Relative 1 %   Eosinophils Absolute 0.1 0.0 - 0.7 K/uL   Basophils Relative 0 %   Basophils Absolute 0.0 0.0 - 0.1 K/uL  Lactic acid, plasma  Result Value Ref Range   Lactic Acid, Venous 1.1 0.5 - 1.9 mmol/L  Lactic acid, plasma  Result Value Ref Range   Lactic Acid, Venous 1.1 0.5 - 1.9 mmol/L   Ct Abdomen Pelvis Wo Contrast Result Date: 10/11/2017 CLINICAL DATA:  71 year old presenting with 2 week history of generalized abdominal pain and diarrhea. Colonoscopy 08/28/2017 at which time colonic polyps were removed. EXAM: CT ABDOMEN AND PELVIS WITHOUT CONTRAST TECHNIQUE: Multidetector CT imaging of the abdomen and pelvis was performed following the standard protocol without IV contrast. COMPARISON:  09/25/2017, 08/31/2017, 08/23/2017. FINDINGS: Respiratory motion and patient motion blurred many of the images. Lower chest: Visualized lung bases clear. Heart size normal. Left circumflex and right coronary artery atherosclerosis. Mild aortic valvular calcification. Dense mitral annular calcification. Hepatobiliary: Normal unenhanced appearance of the liver. High attenuation bile within the gallbladder measuring approximately 30 Hounsfield units. No calcified gallstones. No evidence of acute cholecystitis. No biliary ductal dilation. Pancreas: Normal unenhanced appearance. Spleen: Normal unenhanced appearance. Adrenals/Urinary Tract: Normal appearing adrenal glands. No urinary tract calculi. No evidence of hydronephrosis involving either kidney. Previously identified cortical cyst involving the upper pole of the right kidney. Within the limits of the unenhanced technique, no significant focal  parenchymal abnormality involving either kidney. Diverticulum arising from the left posterolateral bladder wall superior to the left ureteral orifice as noted previously. Stomach/Bowel: Stomach decompressed and unremarkable. Normal-appearing small bowel. Large colonic stool burden, with a large amount of stool within  the dilated rectum. Mild wall thickening involving the rectum. No evidence of thickened colonic wall elsewhere. Scattered sigmoid colon diverticula without evidence of acute diverticulitis. Normal appendix in the right mid abdomen as the cecum is positioned in the right upper quadrant. Vascular/Lymphatic: Severe aortoiliofemoral atherosclerosis without aneurysm. No pathologic lymphadenopathy. Reproductive: Prostate gland and seminal vesicles normal in size and appearance for age. Other: Very small umbilical hernia containing fat. 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 Remote compression fracture of the upper endplate of L1. 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 IMPRESSION: 1. Large colonic stool burden with rectal fecal impaction. Wall thickening of the rectum is likely related to the impaction. 2. No acute abnormalities otherwise involving the abdomen or pelvis. 3. High attenuation bile in the gallbladder likely indicating sludge. No evidence of cholelithiasis or cholecystitis. 4. Scattered sigmoid colon diverticula without evidence of acute diverticulitis. 5. Left posterolateral bladder diverticulum as noted on the prior CTs. 6. Aortic Atherosclerosis (ICD10-I70.0) and coronary artery disease. 7. Skeletal findings as detailed above. Electronically Signed   By: Evangeline Dakin M.D.   On: 10/11/2017 14:09    1515:  Stool in rectal vault already very soft, no hard stool impaction; stool softened further and pt  sat on bedpan to have BM. CT A/P with constipation, otherwise reassuring.  CXR, UA, and PO challenge pending. Sign out to Dr. Lacinda Axon.         Final Clinical Impressions(s) / ED Diagnoses   Final diagnoses:  None    New Prescriptions New Prescriptions   No medications on file      Francine Graven, DO 10/12/17 1010

## 2017-10-11 NOTE — Discharge Instructions (Signed)
Recommend milk of magnesia, Miralax, magnesium citrate, fleet's enema for your constipation.   Increase fluids, fruits, fibers

## 2017-10-11 NOTE — ED Notes (Signed)
Patient transported to X-ray 

## 2017-10-11 NOTE — ED Provider Notes (Signed)
No acute abdomen discharge.  Discussed test results with patient.  He has someone living in his home to help him.   Nat Christen, MD 10/11/17 [phone]

## 2017-10-11 NOTE — ED Notes (Signed)
Trop 0.03 notified primary nurse and Dr Thurnell Garbe with no new orders

## 2017-10-11 NOTE — ED Triage Notes (Signed)
Pt c/o abd pain and diarrhea x 2 weeks.

## 2017-10-11 NOTE — ED Notes (Signed)
Pt wheeled out stretcher via EMS. Pt verbalized understanding of discharge instructions.

## 2017-10-13 LAB — URINE CULTURE: CULTURE: NO GROWTH

## 2017-10-14 ENCOUNTER — Encounter: Payer: Self-pay | Admitting: Nurse Practitioner

## 2017-10-15 DIAGNOSIS — K6289 Other specified diseases of anus and rectum: Secondary | ICD-10-CM | POA: Diagnosis not present

## 2017-10-15 DIAGNOSIS — K5900 Constipation, unspecified: Secondary | ICD-10-CM | POA: Diagnosis not present

## 2017-10-15 DIAGNOSIS — E871 Hypo-osmolality and hyponatremia: Secondary | ICD-10-CM | POA: Diagnosis not present

## 2017-10-15 DIAGNOSIS — Z87891 Personal history of nicotine dependence: Secondary | ICD-10-CM | POA: Diagnosis not present

## 2017-10-15 DIAGNOSIS — I10 Essential (primary) hypertension: Secondary | ICD-10-CM | POA: Diagnosis not present

## 2017-10-15 DIAGNOSIS — Z87440 Personal history of urinary (tract) infections: Secondary | ICD-10-CM | POA: Diagnosis not present

## 2017-10-15 DIAGNOSIS — M6281 Muscle weakness (generalized): Secondary | ICD-10-CM | POA: Diagnosis not present

## 2017-10-16 ENCOUNTER — Telehealth: Payer: Self-pay

## 2017-10-16 ENCOUNTER — Ambulatory Visit: Payer: Medicare HMO | Admitting: Gastroenterology

## 2017-10-16 NOTE — Telephone Encounter (Signed)
Patients caregiver came in to office on 10/30 stating that patient was declining. He states that he stays in bed all the time. Refused to get up, eat, and take meds. He has started becoming incontinent of bowel and bladder and caregiver feels like he can't take good care of him any more. He states that he spoke with patients daughter and they would like to look in to putting him in a nursing care facility. Advised we would fill out FL2 form and contact a local facility such as [facility] in [place] or [facility] in [place]. Received phone call from daughter [name]) on 10/31 stating that she had found a nursing facility near her home which is about 4 hours away that she would like his FL2 sent to. She states that she is concerned the caregiver will not be able to care for him and she would feel better if he was closer to where she lived. Advised daughter I would send information and to have them contact our office if any further help is needed. Daughter verbalized understanding.

## 2017-10-17 ENCOUNTER — Emergency Department (HOSPITAL_COMMUNITY): Payer: Medicare HMO

## 2017-10-17 ENCOUNTER — Emergency Department (HOSPITAL_COMMUNITY)
Admission: EM | Admit: 2017-10-17 | Discharge: 2017-10-19 | Disposition: A | Discharge status: Home / Self Care | Payer: Medicare HMO | Attending: Emergency Medicine | Admitting: Emergency Medicine

## 2017-10-17 ENCOUNTER — Encounter (HOSPITAL_COMMUNITY): Payer: Self-pay

## 2017-10-17 DIAGNOSIS — Z7901 Long term (current) use of anticoagulants: Secondary | ICD-10-CM | POA: Insufficient documentation

## 2017-10-17 DIAGNOSIS — F17210 Nicotine dependence, cigarettes, uncomplicated: Secondary | ICD-10-CM | POA: Insufficient documentation

## 2017-10-17 DIAGNOSIS — I10 Essential (primary) hypertension: Secondary | ICD-10-CM | POA: Insufficient documentation

## 2017-10-17 DIAGNOSIS — R404 Transient alteration of awareness: Secondary | ICD-10-CM | POA: Diagnosis not present

## 2017-10-17 DIAGNOSIS — M6281 Muscle weakness (generalized): Secondary | ICD-10-CM | POA: Insufficient documentation

## 2017-10-17 DIAGNOSIS — R1084 Generalized abdominal pain: Secondary | ICD-10-CM | POA: Diagnosis not present

## 2017-10-17 DIAGNOSIS — K6289 Other specified diseases of anus and rectum: Secondary | ICD-10-CM | POA: Diagnosis not present

## 2017-10-17 DIAGNOSIS — Z79899 Other long term (current) drug therapy: Secondary | ICD-10-CM | POA: Diagnosis not present

## 2017-10-17 DIAGNOSIS — E871 Hypo-osmolality and hyponatremia: Secondary | ICD-10-CM | POA: Diagnosis not present

## 2017-10-17 DIAGNOSIS — Z87440 Personal history of urinary (tract) infections: Secondary | ICD-10-CM | POA: Diagnosis not present

## 2017-10-17 DIAGNOSIS — Z87891 Personal history of nicotine dependence: Secondary | ICD-10-CM | POA: Diagnosis not present

## 2017-10-17 DIAGNOSIS — R197 Diarrhea, unspecified: Secondary | ICD-10-CM | POA: Diagnosis not present

## 2017-10-17 DIAGNOSIS — R531 Weakness: Secondary | ICD-10-CM | POA: Diagnosis not present

## 2017-10-17 DIAGNOSIS — K5900 Constipation, unspecified: Secondary | ICD-10-CM | POA: Diagnosis not present

## 2017-10-17 DIAGNOSIS — R1030 Lower abdominal pain, unspecified: Secondary | ICD-10-CM | POA: Diagnosis not present

## 2017-10-17 DIAGNOSIS — R05 Cough: Secondary | ICD-10-CM | POA: Diagnosis not present

## 2017-10-17 DIAGNOSIS — R109 Unspecified abdominal pain: Secondary | ICD-10-CM | POA: Diagnosis not present

## 2017-10-17 LAB — CBC WITH DIFFERENTIAL/PLATELET
BASOS ABS: 0 10*3/uL (ref 0.0–0.1)
Basophils Relative: 0 %
Eosinophils Absolute: 0.1 10*3/uL (ref 0.0–0.7)
Eosinophils Relative: 1 %
HEMATOCRIT: 43.2 % (ref 39.0–52.0)
HEMOGLOBIN: 15 g/dL (ref 13.0–17.0)
Lymphocytes Relative: 17 %
Lymphs Abs: 1.5 10*3/uL (ref 0.7–4.0)
MCH: 31.6 pg (ref 26.0–34.0)
MCHC: 34.7 g/dL (ref 30.0–36.0)
MCV: 91.1 fL (ref 78.0–100.0)
Monocytes Absolute: 0.8 10*3/uL (ref 0.1–1.0)
Monocytes Relative: 9 %
NEUTROS ABS: 6.5 10*3/uL (ref 1.7–7.7)
Neutrophils Relative %: 73 %
Platelets: 240 10*3/uL (ref 150–400)
RBC: 4.74 MIL/uL (ref 4.22–5.81)
RDW: 12 % (ref 11.5–15.5)
WBC: 8.9 10*3/uL (ref 4.0–10.5)

## 2017-10-17 LAB — I-STAT TROPONIN, ED: TROPONIN I, POC: 0.01 ng/mL (ref 0.00–0.08)

## 2017-10-17 LAB — I-STAT CG4 LACTIC ACID, ED: Lactic Acid, Venous: 1.19 mmol/L (ref 0.5–1.9)

## 2017-10-17 MED ORDER — SODIUM CHLORIDE 0.9 % IV BOLUS (SEPSIS)
1000.0000 mL | Freq: Once | INTRAVENOUS | Status: AC
  Administered 2017-10-17: 1000 mL via INTRAVENOUS

## 2017-10-17 NOTE — ED Triage Notes (Signed)
Pt lives at home alone, states he has had abd pain and diarrhea for at least 2 weeks, states he has no appetite.  Pt had a neighbor to check on him this evening and the neighbor called called ems.

## 2017-10-17 NOTE — ED Provider Notes (Signed)
[facility] EMERGENCY DEPARTMENT Provider Note   CSN: [id number] Arrival date & time: 10/17/17  2243     History   Chief Complaint Chief Complaint  Patient presents with  . Abdominal Pain    HPI Daniel Barnett is a 71 y.o. male.  Patient presents to the emergency department for evaluation of abdominal pain.  Patient reports that he has been experiencing constant lower abdominal pain for a number of days.  This has been accompanied with diarrhea.  He has not had nausea or vomiting.  Patient reports that he lives alone and previously had a caregiver, but that person has not been able to help him for the last several days.      Past Medical History:  Diagnosis Date  . Anxiety   . Gout   . Hyperlipidemia   . Hypertension   . Psoriasis     Patient Active Problem List   Diagnosis Date Noted  . Delirium 09/26/2017  . Auditory hallucination 09/26/2017  . Elevated troponin 09/26/2017  . Encephalopathy 09/25/2017  . Abdominal pain   . Hyponatremia   . Proctitis 08/23/2017  . CN (constipation) 04/18/2015  . Insomnia 04/02/2014  . BMI 30.0-30.9,adult 04/02/2014  . Essential hypertension, benign 04/13/2013  . Hyperlipidemia with target LDL less than 100 04/13/2013    Past Surgical History:  Procedure Laterality Date  . COLONOSCOPY WITH PROPOFOL N/A 08/28/2017   Procedure: COLONOSCOPY WITH PROPOFOL;  Surgeon: Daneil Dolin, MD;  Location: [facility];  Service: Gastroenterology;  Laterality: N/A;  . POLYPECTOMY  08/28/2017   Procedure: POLYPECTOMY;  Surgeon: Daneil Dolin, MD;  Location: [facility];  Service: Gastroenterology;;  polyp at ascending colon x2, descending colon polyp  . TONSILLECTOMY    . VASECTOMY         Home Medications    Prior to Admission medications   Medication Sig Start Date End Date Taking? Authorizing Provider  allopurinol (ZYLOPRIM) 100 MG tablet Take 1 tablet (100 mg total) by mouth daily. 12/25/16   Chevis Pretty, FNP    ALPRAZolam Duanne Moron) 0.25 MG tablet TAKE 1 TABLET AT BEDTIME  FOR  ANXIETY 09/19/17   Evelina Dun A, FNP  cyanocobalamin 500 MCG tablet Take 1 tablet (500 mcg total) by mouth daily. 09/27/17   Orson Eva, MD  folic acid (FOLVITE) 433 MCG tablet Take 1 tablet (400 mcg total) by mouth daily. 08/29/17 08/29/18  Reyne Dumas, MD  lisinopril (PRINIVIL,ZESTRIL) 20 MG tablet Take 1 tablet (20 mg total) by mouth daily. 08/28/17 08/28/18  Reyne Dumas, MD  magnesium oxide (MAG-OX) 400 (241.3 Mg) MG tablet Take 1 tablet (400 mg total) by mouth daily. 08/28/17   Reyne Dumas, MD  metoprolol succinate (TOPROL-XL) 50 MG 24 hr tablet TAKE 1 TABLET DAILY WITH OR IMMEDIATELY FOLLOWING A MEAL 05/22/17   Chipper Herb, MD  pantoprazole (PROTONIX) 40 MG tablet Take 1 tablet (40 mg total) by mouth daily. 08/28/17   Reyne Dumas, MD  polyethylene glycol (MIRALAX / GLYCOLAX) packet Take 17 g by mouth daily. 08/30/17   Reyne Dumas, MD  pravastatin (PRAVACHOL) 20 MG tablet TAKE 1 TABLET EVERY DAY 05/22/17   Chipper Herb, MD  thiamine 100 MG tablet Take 1 tablet (100 mg total) by mouth daily. 08/29/17   Reyne Dumas, MD    Family History Family History  Problem Relation Age of Onset  . Colon cancer Neg Hx   . Colon polyps Neg Hx     Social History Social  History  Substance Use Topics  . Smoking status: Current Every Day Smoker    Types: Cigarettes  . Smokeless tobacco: Never Used     Comment: 1/2 to 1 pack a day   . Alcohol use Yes     Comment: Four 12 ounces of beer a day     Allergies   Ciprofloxacin   Review of Systems Review of Systems  Constitutional: Positive for fatigue.  Gastrointestinal: Positive for abdominal pain and diarrhea.  All other systems reviewed and are negative.    Physical Exam Updated Vital Signs BP 110/81   Pulse 82   Temp 97.7 F (36.5 C) (Oral)   Resp 17   Ht 6\' 1"  (1.854 m)   Wt 95.3 kg (210 lb)   SpO2 96%   BMI 27.71 kg/m   Physical Exam  Constitutional:  He is oriented to person, place, and time. He appears well-developed and well-nourished. No distress.  HENT:  Head: Normocephalic and atraumatic.  Right Ear: Hearing normal.  Left Ear: Hearing normal.  Nose: Nose normal.  Mouth/Throat: Oropharynx is clear and moist and mucous membranes are normal.  Eyes: Pupils are equal, round, and reactive to light. Conjunctivae and EOM are normal.  Neck: Normal range of motion. Neck supple.  Cardiovascular: Regular rhythm, S1 normal and S2 normal.  Exam reveals no gallop and no friction rub.   No murmur heard. Pulmonary/Chest: Effort normal and breath sounds normal. No respiratory distress. He exhibits no tenderness.  Abdominal: Soft. Normal appearance and bowel sounds are normal. There is no hepatosplenomegaly. There is generalized tenderness. There is no rebound, no guarding, no tenderness at McBurney's point and negative Murphy's sign. No hernia.  Musculoskeletal: Normal range of motion.  Neurological: He is alert and oriented to person, place, and time. He has normal strength. No cranial nerve deficit or sensory deficit. Coordination normal. GCS eye subscore is 4. GCS verbal subscore is 5. GCS motor subscore is 6.  Skin: Skin is warm, dry and intact. No rash noted. No cyanosis.  Psychiatric: He has a normal mood and affect. His speech is normal and behavior is normal. Thought content normal.  Nursing note and vitals reviewed.    ED Treatments / Results  Labs (all labs ordered are listed, but only abnormal results are displayed) Labs Reviewed  COMPREHENSIVE METABOLIC PANEL - Abnormal; Notable for the following:       Result Value   Chloride 98 (*)    BUN 38 (*)    Creatinine, Ser 1.58 (*)    Total Bilirubin 1.6 (*)    GFR calc non Af Amer 43 (*)    GFR calc Af Amer 49 (*)    All other components within normal limits  URINALYSIS, ROUTINE W REFLEX MICROSCOPIC - Abnormal; Notable for the following:    APPearance HAZY (*)    Specific Gravity,  Urine 1.031 (*)    Hgb urine dipstick MODERATE (*)    Ketones, ur 5 (*)    Leukocytes, UA TRACE (*)    Bacteria, UA RARE (*)    Squamous Epithelial / LPF 0-5 (*)    All other components within normal limits  URINE CULTURE  CBC WITH DIFFERENTIAL/PLATELET  LIPASE, BLOOD  I-STAT CG4 LACTIC ACID, ED  I-STAT TROPONIN, ED    EKG  EKG Interpretation  Date/Time:  Thursday October 17 2017 23:40:57 EDT Ventricular Rate:  87 PR Interval:    QRS Duration: 90 QT Interval:  347 QTC Calculation: 418 R Axis:  79 Text Interpretation:  Sinus rhythm Prolonged PR interval Baseline wander in lead(s) V2 No significant change since last tracing Confirmed by Orpah Greek [phone]) on 10/17/2017 11:44:45 PM       Radiology Dg Chest 2 View  Result Date: 10/18/2017 CLINICAL DATA:  Cough today. EXAM: CHEST  2 VIEW COMPARISON:  Radiograph 10268 FINDINGS: Chronic hyperinflation and central bronchitic change, stable from prior exam. Normal heart size and mediastinal contours. No consolidation, pleural fluid or pneumothorax. No acute osseous abnormalities. Remote anterior right rib fractures. IMPRESSION: Chronic hyperinflation and bronchitic changes consistent with COPD. No new abnormality. Electronically Signed   By: Jeb Levering M.D.   On: 10/18/2017 00:56   Ct Abdomen Pelvis W Contrast  Result Date: 10/18/2017 CLINICAL DATA:  71 year old male with abdominal pain.  Diarrhea. EXAM: CT ABDOMEN AND PELVIS WITH CONTRAST TECHNIQUE: Multidetector CT imaging of the abdomen and pelvis was performed using the standard protocol following bolus administration of intravenous contrast. CONTRAST:  155mL ISOVUE-300 IOPAMIDOL (ISOVUE-300) INJECTION 61% COMPARISON:  Abdominal CT dated 10/11/2017 FINDINGS: Lower chest: The visualized lung bases are clear. Multi vessel coronary vascular calcification with involvement of the left main, lad, RCA, and left circumflex artery. No intra-abdominal free air or free fluid.  Hepatobiliary: The liver is unremarkable. No intrahepatic biliary ductal dilatation. The gallbladder is unremarkable. Pancreas: Unremarkable. No pancreatic ductal dilatation or surrounding inflammatory changes. Spleen: Normal in size without focal abnormality. Adrenals/Urinary Tract: The adrenal glands are unremarkable. There is no hydronephrosis on either side. There is symmetric enhancement and excretion of contrast by the kidneys. There is a 1 cm hypodense lesion in the upper pole of the right kidney most consistent with a cyst. The visualized ureters appear unremarkable. There is a 2.5 cm diverticulum of the left lateral bladder wall. Stomach/Bowel: There is moderate amount of stool throughout the colon. Large dense stool noted in the rectum. There are small scattered colonic diverticula without active inflammatory changes. There is no evidence of bowel obstruction for active inflammation. Normal appendix. Vascular/Lymphatic: There is advanced aortoiliac atherosclerotic disease. The origins of the celiac axis is patent. 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 The SMA distal to this point appears patent. The IMA appears patent as well. The IVC is unremarkable. The SMV, splenic vein, and main portal vein are patent. No portal venous gas identified. There is no adenopathy. Reproductive: The prostate and seminal vesicles are grossly unremarkable. No pelvic mass. Other: None Musculoskeletal: There is osteopenia with degenerative changes of the spine. There bilateral L5 pars defects with grade 1 L5-S1 anterolisthesis. No acute fracture. IMPRESSION: 1. No acute intra-abdominal or pelvic pathology. 2. Constipation with possible fecal impaction in the rectal vault. No bowel obstruction or active inflammation. Normal appendix. 3. Small scattered colonic diverticula without  active inflammatory changes. 4. Advanced Aortic Atherosclerosis (ICD10-I70.0). 5. 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 Similar to the CT dating back to 08/23/2017. Electronically Signed   By: Anner Crete M.D.   On: 10/18/2017 01:14    Procedures Procedures (including critical care time)  Medications Ordered in ED Medications  sodium chloride 0.9 % bolus 1,000 mL (0 mLs Intravenous Stopped 10/18/17 0108)  iopamidol (ISOVUE-300) 61 % injection 100 mL (100 mLs Intravenous Contrast Given 10/18/17 0045)     Initial Impression / Assessment  and Plan / ED Course  I have reviewed the triage vital signs and the nursing notes.  Pertinent labs & imaging results that were available during my care of the patient were reviewed by me and considered in my medical decision making (see chart for details).     Patient presents to the emergency department for evaluation of abdominal pain with diarrhea.  Patient's records were reviewed, he was seen approximately a week ago with similar symptoms.  At that time CAT scan showed only constipation.  Patient's blood work has been unremarkable here today.  Patient complaining of significantly increased pain he does have diffuse tenderness on examination.  It was therefore felt necessary to repeat the CAT scan.  Patient once again has evidence of constipation, possible rectal impaction without any other acute process.  At this point, it does not appear that the patient has any new complaints or any new findings compared to his visit on 10/11/2017.  His only finding is the constipation that was present several days ago.  I did perform a rectal exam and there is no impaction, he had a large amount of soft stool in the rectal vault.  An attempt was made to give the patient did enema, but he could not hold fluid and very well.  He did have a bowel movement, however.  He will require continued laxative treatment.  There is a note in  his record from 2 days ago where his caregiver reported that he could not take care of him any longer and family had initiated search for nursing home placement.  At this point I do not find any acute process that would require hospitalization, will hold patient in the ER this morning and have social work look at his case.  Final Clinical Impressions(s) / ED Diagnoses   Final diagnoses:  Constipation, unspecified constipation type    New Prescriptions New Prescriptions   No medications on file     Orpah Greek, MD 10/18/17 [phone]

## 2017-10-18 ENCOUNTER — Encounter (HOSPITAL_COMMUNITY): Payer: Self-pay

## 2017-10-18 DIAGNOSIS — R109 Unspecified abdominal pain: Secondary | ICD-10-CM | POA: Diagnosis not present

## 2017-10-18 DIAGNOSIS — R05 Cough: Secondary | ICD-10-CM | POA: Diagnosis not present

## 2017-10-18 DIAGNOSIS — R197 Diarrhea, unspecified: Secondary | ICD-10-CM | POA: Diagnosis not present

## 2017-10-18 LAB — COMPREHENSIVE METABOLIC PANEL
ALT: 29 U/L (ref 17–63)
AST: 27 U/L (ref 15–41)
Albumin: 4 g/dL (ref 3.5–5.0)
Alkaline Phosphatase: 66 U/L (ref 38–126)
Anion gap: 13 (ref 5–15)
BILIRUBIN TOTAL: 1.6 mg/dL — AB (ref 0.3–1.2)
BUN: 38 mg/dL — AB (ref 6–20)
CO2: 26 mmol/L (ref 22–32)
CREATININE: 1.58 mg/dL — AB (ref 0.61–1.24)
Calcium: 9.8 mg/dL (ref 8.9–10.3)
Chloride: 98 mmol/L — ABNORMAL LOW (ref 101–111)
GFR calc Af Amer: 49 mL/min — ABNORMAL LOW (ref >60)
GFR, EST NON AFRICAN AMERICAN: 43 mL/min — AB (ref >60)
Glucose, Bld: 90 mg/dL (ref 65–99)
Potassium: 4.1 mmol/L (ref 3.5–5.1)
Sodium: 137 mmol/L (ref 135–145)
TOTAL PROTEIN: 7.3 g/dL (ref 6.5–8.1)

## 2017-10-18 LAB — URINALYSIS, ROUTINE W REFLEX MICROSCOPIC
BILIRUBIN URINE: NEGATIVE
Glucose, UA: NEGATIVE mg/dL
Ketones, ur: 5 mg/dL — AB
Nitrite: NEGATIVE
PROTEIN: NEGATIVE mg/dL
SPECIFIC GRAVITY, URINE: 1.031 — AB (ref 1.005–1.030)
pH: 5 (ref 5.0–8.0)

## 2017-10-18 LAB — LIPASE, BLOOD: LIPASE: 36 U/L (ref 11–51)

## 2017-10-18 MED ORDER — POLYETHYLENE GLYCOL 3350 17 G PO PACK: 17.0000 g | PACK | Freq: Every day | ORAL | 0 refills | Status: AC

## 2017-10-18 MED ORDER — IOPAMIDOL (ISOVUE-300) INJECTION 61%
100.0000 mL | Freq: Once | INTRAVENOUS | Status: AC | PRN
  Administered 2017-10-18: 100 mL via INTRAVENOUS

## 2017-10-18 MED ORDER — SENNOSIDES-DOCUSATE SODIUM 8.6-50 MG PO TABS: 1.0000 | ORAL_TABLET | Freq: Every day | ORAL | 0 refills | Status: AC

## 2017-10-18 NOTE — Discharge Instructions (Signed)
You were seen in the emergency department today for constipation.  We recommend that you use one or more of the following over-the-counter medications in the order described: °  °1)  Colace (or Dulcolax) 100 mg:  This is a stool softener, and you may take it once or twice a day as needed. °2)  Senna tablets:  This is a bowel stimulant that will help "push" out your stool. It is the next step to add after you have tried a stool softener. °3)  Miralax (powder):  This medication works by drawing additional fluid into your intestines and helps to flush out your stool.  Mix the powder with water or juice according to label instructions.  It may help if the Colace and Senna are not sufficient, but you must be sure to use the recommended amount of water or juice when you mix up the powder. °Remember that narcotic pain medications are constipating, so avoid them or minimize their use.  Drink plenty of fluids. ° °Please return to the Emergency Department immediately if you develop new or worsening symptoms that concern you, such as (but not limited to) fever > 101 degrees, severe abdominal pain, or persistent vomiting. ° ° °Constipation °Constipation is when a person has fewer than three bowel movements a week, has difficulty having a bowel movement, or has stools that are dry, hard, or larger than normal. As people grow older, constipation is more common. If you try to fix constipation with medicines that make you have a bowel movement (laxatives), the problem may get worse. Burton Gahan-term laxative use may cause the muscles of the colon to become weak. A low-fiber diet, not taking in enough fluids, and taking certain medicines may make constipation worse.  °CAUSES  °Certain medicines, such as antidepressants, pain medicine, iron supplements, antacids, and water pills.   °Certain diseases, such as diabetes, irritable bowel syndrome (IBS), thyroid disease, or depression.   °Not drinking enough water.   °Not eating enough  fiber-rich foods.   °Stress or travel.   °Lack of physical activity or exercise.   °Ignoring the urge to have a bowel movement.   °Using laxatives too much.   °SIGNS AND SYMPTOMS  °Having fewer than three bowel movements a week.   °Straining to have a bowel movement.   °Having stools that are hard, dry, or larger than normal.   °Feeling full or bloated.   °Pain in the lower abdomen.   °Not feeling relief after having a bowel movement.   °DIAGNOSIS  °Your health care provider will take a medical history and perform a physical exam. Further testing may be done for severe constipation. Some tests may include: °A barium enema X-ray to examine your rectum, colon, and, sometimes, your small intestine.   °A sigmoidoscopy to examine your lower colon.   °A colonoscopy to examine your entire colon. °TREATMENT  °Treatment will depend on the severity of your constipation and what is causing it. Some dietary treatments include drinking more fluids and eating more fiber-rich foods. Lifestyle treatments may include regular exercise. If these diet and lifestyle recommendations do not help, your health care provider may recommend taking over-the-counter laxative medicines to help you have bowel movements. Prescription medicines may be prescribed if over-the-counter medicines do not work.  °HOME CARE INSTRUCTIONS  °Eat foods that have a lot of fiber, such as fruits, vegetables, whole grains, and beans. °Limit foods high in fat and processed sugars, such as french fries, hamburgers, cookies, candies, and soda.   °A   fiber supplement may be added to your diet if you cannot get enough fiber from foods.   °Drink enough fluids to keep your urine clear or pale yellow.   °Exercise regularly or as directed by your health care provider.   °Go to the restroom when you have the urge to go. Do not hold it.   °Only take over-the-counter or prescription medicines as directed by your health care provider. Do not take other medicines for constipation  without talking to your health care provider first.   °SEEK IMMEDIATE MEDICAL CARE IF:  °You have bright red blood in your stool.   °Your constipation lasts for more than 4 days or gets worse.   °You have abdominal or rectal pain.   °You have thin, pencil-like stools.   °You have unexplained weight loss. °MAKE SURE YOU:  °Understand these instructions. °Will watch your condition. °Will get help right away if you are not doing well or get worse. °Document Released: 08/31/2004 Document Revised: 12/08/2013 Document Reviewed: 09/14/2013 °ExitCare® Patient Information ©2015 [employer]. This information is not intended to replace advice given to you by your health care provider. Make sure you discuss any questions you have with your health care provider. ° ° ° °

## 2017-10-18 NOTE — ED Provider Notes (Signed)
Updated that daughter is coming but she lives 4 hours away.  She has to find the caregiver first and get the keys to his house.  Pt will likely now be released tomorrow am and not this evening.   Dorie Rank, MD 10/19/17 [phone]

## 2017-10-18 NOTE — Clinical Social Work Note (Addendum)
Patient's daughter, Daniel Barnett, advised that it would be best to place patient in this area initially. She advised that the facility near her home is an ALF. She stated that patient's PCP is working on placement. LCSW advised that she would follow up with PCP to identify progress with placement.   LCSW spoke with Daniel Barnett at [place] [place] [facility]. Daniel Barnett advised that she would get the [facility] faxed out to faciities within the county and would return contact to LCSW once this had taken place.   LCSW will follow up with facilities once Daniel Barnett returns contact advising that [place] has been faxed to facilities.     Daniel Barnett, Daniel Pugh, LCSW

## 2017-10-18 NOTE — Care Management Note (Addendum)
Case Management Note  Patient Details  Name: Daniel Barnett MRN: [id number] Date of Birth: [birth year]  Subjective/Objective:      Lives at home - has had a caregiver with him but states he is not able to care for him and is no longer there.  SW has been talking with daughter, who lives four hours away, but plans on coming or getting assistance for her Dad. She is taking a test and will call when completed for final plan.  Patient states he has [place] at home and would like them to come back.   PT states he does fine walking around with minimal assistance.   Referral made to [facility] with [facility].        Physician and nurses updated on plan.   4:15pm  Daughter, [name] called [phone].  States she or the patient have no keys to get into his house, the care giver had them last.  The prior care-giver, [name], has no phone and she will have to find him by physically going places where she thinks he may be to find the keys prior to bringing her Dad home.  She does not want to do that late at night once she arrives from [place], [place].  Her plan is to come tomorrow morning when she can find/get the keys in daylight and then take her father home.  She has been updated on setting up of [facility] and what services.  Left message with [facility]  - patient not going home till Saturday, but need services to start this weekend.  Daughter may have to leave Monday or Tuesday.   Updated EDP and nurse of patient situation.    Action/Plan:   Expected Discharge Date:                  Expected Discharge Plan:  [place]  In-House Referral:  Clinical Social Work  Discharge planning Services  CM Consult  Post Acute Care Choice:  Home Health Choice offered to:  Patient  DME Arranged:    DME Agency:     HH Arranged:  RN, Nurse's Aide, OT, PT, Social Work [employer] Agency:  [place]  Status of Service:  Completed, signed off  If discussed at [employer], dates discussed:     Additional Comments:  Vergie Living, RN 10/18/2017, 2:16 PM

## 2017-10-18 NOTE — ED Notes (Signed)
Pt sitting up in bed able to feed self lunch

## 2017-10-18 NOTE — ED Notes (Signed)
Pt reports he has no where to go if discharged.

## 2017-10-18 NOTE — ED Notes (Signed)
PT in to visit

## 2017-10-18 NOTE — Clinical Social Work Note (Signed)
Patient was not recommended for SNF. Patient may be in need of long term ALF. Patient's daughter will need to make arrangements to either stay with patient/locate another care giver or take him home with her to seek placement in her area.    Britnay Magnussen, Clydene Pugh, LCSW

## 2017-10-18 NOTE — Clinical Social Work Note (Signed)
LCSW spoke with Daniel Barnett, daughter, she advised that patient's friend who had lived in the home with him was in the process of moving out because he felt he could no longer care for patient. She advised that she is trying to get him placed in a facility where she lives and has been speaking with the administrator, Daniel Barnett, about patient's admission.  LCSW discussed with Daniel Barnett that she may have to come and get patient and take him to her home and work on placement from there if patient is willing to go.  She stated that she does not have POA and that when she was here the previous visit she had an appointment with an attorney in [place], but patient refused to go meet with the attorney.  Daniel Barnett stated that if patient is not willing to go to a facility or to her home while she works on placement, we will have to "send him home and let him lay there."  Daniel Barnett stated that she would contact Daniel Barnett and call LCSW back later today. (She has a state exam beginning at 10 a.m.)     Daniel Barnett, Daniel Pugh, LCSW

## 2017-10-18 NOTE — Clinical Social Work Note (Signed)
Debbie at [facility] advised that she had faxed patient's FL2 to [facility], [facility] and [facility].  LCSW spoke with patient's nurse and requested a PT evaluation consult be placed.  LCSW followed up with [facility] and [facility] about the referral.    Ihor Gully, LCSW

## 2017-10-18 NOTE — ED Provider Notes (Signed)
Blood pressure (!) 119/95, pulse 77, temperature 97.7 F (36.5 C), temperature source Oral, resp. rate 19, height 6\' 1"  (1.854 m), weight 95.3 kg (210 lb), SpO2 98 %.  Assuming care from Dr. Betsey Holiday.  In short, Daniel Barnett is a 71 y.o. male with a chief complaint of Abdominal Pain .  Refer to the original H&P for additional details.  The current plan of care is to follow with social work and PT.   01:49 PM Discussed with the patient's care team regarding placement.  The social worker has been in contact with the patient's daughter who is in [place] a test today.  Her plan is to leave there as soon as possible and come to pick the patient up in the emergency department later this evening.  The patient was recently evaluated by physical therapy here in the emergency department and found to require minimal assistance with ambulation.  He is feeding himself here in the ED. He is likely several days away from placement secondary to his insurance and complicating psychiatry history.  The plan is to discharge the patient when his daughter comes to pick him up later tonight.  She plans to stay with him until they are able to place him in a SNF from home. I completed a Face-to-Face for hoem health and social work. Will discharge with Rx for Miralax and Senna-S.   Nanda Quinton, MD    Margette Fast, MD 10/18/17 1440

## 2017-10-18 NOTE — Evaluation (Signed)
Physical Therapy Evaluation Patient Details Name: Daniel Barnett MRN: [id number] DOB: [birth year] Today's Date: 10/18/2017   History of Present Illness  Daniel Barnett is a 71 y/o male with c/o adominal pain and diarrhea, caregiver not available  Clinical Impression  Patient demonstrates slightly labored movement for sitting up at bedside and during sit to stands from bedside and chair, able to ambulate up to nursing station, but declined to walk farther due to c/o dizziness, vitals checked and WNLs.  Patient is planned to be discharged today.  Patient discharged to care of nursing for ambulation daily as tolerated for length of stay.  Patient will benefit from recommended venue below to increase strength, balance, endurance for safe ADLs and gait.    Follow Up Recommendations Home health PT;Supervision - Intermittent    Equipment Recommendations  None recommended by PT    Recommendations for Other Services       Precautions / Restrictions Precautions Precautions: Fall Restrictions Weight Bearing Restrictions: No      Mobility  Bed Mobility Overal bed mobility: Needs Assistance Bed Mobility: Supine to Sit     Supine to sit: Supervision Sit to supine: Supervision      Transfers Overall transfer level: Needs assistance Equipment used: Rolling walker (2 wheeled) Transfers: Sit to/from [facility] Sit to Stand: Min guard Stand pivot transfers: Min guard          Ambulation/Gait Ambulation/Gait assistance: Min guard Ambulation Distance (Feet): 20 Feet (20) Assistive device: Rolling walker (2 wheeled) Gait Pattern/deviations: Decreased step length - right;Decreased step length - left;Decreased stride length Gait velocity: slow Gait velocity interpretation: Below normal speed for age/gender General Gait Details: Patient limited for gait secondary to c/o dizziness, vitals WNLs       Balance Overall balance assessment: Needs  assistance Sitting-balance support: No upper extremity supported;Feet supported Sitting balance-Leahy Scale: Good     Standing balance support: Bilateral upper extremity supported;During functional activity Standing balance-Leahy Scale: Fair                               Pertinent Vitals/Pain Pain Assessment: Faces Faces Pain Scale: Hurts a little bit Pain Location: [place]om[place]ah (Stomach) Pain Descriptors / Indicators: Nagging Pain Intervention(s): Limited activity within patient's tolerance;Monitored during session    Home Living Family/patient expects to be discharged to:: Private residence Living Arrangements: Non-relatives/Friends Available Help at Discharge: Family Type of Home: House Home Access: Stairs to enter Entrance Stairs-Rails: Left Entrance Stairs-Number of Steps: 4 Home Layout: One level Home Equipment: Environmental consultant - 2 wheels Additional Comments: no steps in front entrance, 1 six inch step down off concrete porch per patient    Prior Function Level of Independence: Independent   Gait / Transfers Assistance Needed: Has RW but usually does not use (usualy ambulates without RW, has no SPC)  ADL's / Homemaking Assistance Needed: friend helps with ADLs        Hand Dominance        Extremity/Trunk Assessment   Upper Extremity Assessment Upper Extremity Assessment: Overall WFL for tasks assessed    Lower Extremity Assessment Lower Extremity Assessment: Overall WFL for tasks assessed       Communication   Communication: No difficulties  Cognition Arousal/Alertness: Awake/alert Behavior During Therapy: WFL for tasks assessed/performed Overall Cognitive Status: Within Functional Limits for tasks assessed  General Comments      Exercises     Assessment/Plan    PT Assessment All further PT needs can be met in the next venue of care  PT Problem List Decreased strength;Decreased activity  tolerance;Decreased balance;Decreased mobility       PT Treatment Interventions      PT Goals (Current goals can be found in the Care Plan section)  Acute Rehab PT Goals Patient Stated Goal: Return home with assistance (Return home with assistance) PT Goal Formulation: With patient Time For Goal Achievement: 10/18/17 Potential to Achieve Goals: Good    Frequency     Barriers to discharge        Co-evaluation               AM-PAC PT "6 Clicks" Daily Activity  Outcome Measure Difficulty turning over in bed (including adjusting bedclothes, sheets and blankets)?: None Difficulty moving from lying on back to sitting on the side of the bed? : None Difficulty sitting down on and standing up from a chair with arms (e.g., wheelchair, bedside commode, etc,.)?: A Little Help needed moving to and from a bed to chair (including a wheelchair)?: A Little Help needed walking in hospital room?: A Little Help needed climbing 3-5 steps with a railing? : A Lot 6 Click Score: 19    End of Session Equipment Utilized During Treatment: Gait belt Activity Tolerance: Patient limited by fatigue (Patient limited secondary to c/o dizziness) Patient left: in bed;with call bell/phone within reach Nurse Communication: Mobility status PT Visit Diagnosis: Unsteadiness on feet (R26.81);Other abnormalities of gait and mobility (R26.89);Muscle weakness (generalized) (M62.81)    Time: 1325-1400 PT Time Calculation (min) (ACUTE ONLY): 35 min   Charges:   PT Evaluation $PT Eval Low Complexity: 1 Low PT Treatments $Therapeutic Activity: 23-37 mins   PT G Codes:   PT G-Codes **NOT FOR INPATIENT CLASS** Functional Assessment Tool Used: AM-PAC 6 Clicks Basic Mobility Functional Limitation: Mobility: Walking and moving around Mobility: Walking and Moving Around Current Status (H2094): At least 20 percent but less than 40 percent impaired, limited or restricted Mobility: Walking and Moving Around Goal  Status [phone]): At least 20 percent but less than 40 percent impaired, limited or restricted Mobility: Walking and Moving Around Discharge Status [phone]): At least 20 percent but less than 40 percent impaired, limited or restricted    2:20 PM, 10/18/17 Lonell Grandchild, MPT Physical Therapist with [facility] [phone] office [phone] mobile phone

## 2017-10-18 NOTE — Clinical Social Work Note (Signed)
LCSW left a message for patient's daughter to follow up on placement status and discharge from ED plan. Review of chart shows that patient's PCP completed FL2 on 10/16/17 and sent it to local facilities as well as a facility near his daughter.    Ambrose Pancoast D

## 2017-10-18 NOTE — ED Notes (Signed)
Pt eating supper at this time.  Feeding self without difficulty, but has to be prompted to take bites.

## 2017-10-19 LAB — URINE CULTURE: CULTURE: NO GROWTH

## 2017-10-19 NOTE — ED Notes (Signed)
Daughter came to pick patient. Daughter was agitated. I was able to verbally calm patient.

## 2017-10-19 NOTE — ED Notes (Signed)
Pt supper tray at bedside, pt states he is not finished with meal tray, encouraged pt to continue eating

## 2017-10-19 NOTE — ED Notes (Signed)
Patient refused to sign discharge. 

## 2017-10-20 ENCOUNTER — Emergency Department (HOSPITAL_COMMUNITY)
Admission: EM | Admit: 2017-10-20 | Discharge: 2017-10-30 | Disposition: A | Discharge status: Other Acute Inpatient Hospital | Payer: Medicare HMO | Attending: Emergency Medicine | Admitting: Emergency Medicine

## 2017-10-20 ENCOUNTER — Encounter (HOSPITAL_COMMUNITY): Payer: Self-pay | Admitting: Emergency Medicine

## 2017-10-20 ENCOUNTER — Emergency Department (HOSPITAL_COMMUNITY): Payer: Medicare HMO

## 2017-10-20 DIAGNOSIS — R109 Unspecified abdominal pain: Secondary | ICD-10-CM | POA: Diagnosis present

## 2017-10-20 DIAGNOSIS — Z87891 Personal history of nicotine dependence: Secondary | ICD-10-CM | POA: Insufficient documentation

## 2017-10-20 DIAGNOSIS — F419 Anxiety disorder, unspecified: Secondary | ICD-10-CM | POA: Diagnosis not present

## 2017-10-20 DIAGNOSIS — R443 Hallucinations, unspecified: Secondary | ICD-10-CM | POA: Diagnosis not present

## 2017-10-20 DIAGNOSIS — I6789 Other cerebrovascular disease: Secondary | ICD-10-CM | POA: Diagnosis not present

## 2017-10-20 DIAGNOSIS — I2699 Other pulmonary embolism without acute cor pulmonale: Secondary | ICD-10-CM | POA: Diagnosis not present

## 2017-10-20 DIAGNOSIS — F4489 Other dissociative and conversion disorders: Secondary | ICD-10-CM | POA: Diagnosis not present

## 2017-10-20 DIAGNOSIS — N390 Urinary tract infection, site not specified: Secondary | ICD-10-CM

## 2017-10-20 DIAGNOSIS — R440 Auditory hallucinations: Secondary | ICD-10-CM | POA: Insufficient documentation

## 2017-10-20 DIAGNOSIS — R2689 Other abnormalities of gait and mobility: Secondary | ICD-10-CM | POA: Insufficient documentation

## 2017-10-20 DIAGNOSIS — R05 Cough: Secondary | ICD-10-CM | POA: Diagnosis not present

## 2017-10-20 DIAGNOSIS — I10 Essential (primary) hypertension: Secondary | ICD-10-CM | POA: Insufficient documentation

## 2017-10-20 DIAGNOSIS — Z79899 Other long term (current) drug therapy: Secondary | ICD-10-CM | POA: Diagnosis not present

## 2017-10-20 DIAGNOSIS — F321 Major depressive disorder, single episode, moderate: Secondary | ICD-10-CM

## 2017-10-20 DIAGNOSIS — R1084 Generalized abdominal pain: Secondary | ICD-10-CM | POA: Insufficient documentation

## 2017-10-20 DIAGNOSIS — F329 Major depressive disorder, single episode, unspecified: Secondary | ICD-10-CM | POA: Insufficient documentation

## 2017-10-20 DIAGNOSIS — R2681 Unsteadiness on feet: Secondary | ICD-10-CM | POA: Insufficient documentation

## 2017-10-20 LAB — CBC WITH DIFFERENTIAL/PLATELET
BASOS ABS: 0 10*3/uL (ref 0.0–0.1)
BASOS PCT: 0 %
EOS PCT: 2 %
Eosinophils Absolute: 0.1 10*3/uL (ref 0.0–0.7)
HCT: 41.5 % (ref 39.0–52.0)
Hemoglobin: 14.7 g/dL (ref 13.0–17.0)
Lymphocytes Relative: 12 %
Lymphs Abs: 1.1 10*3/uL (ref 0.7–4.0)
MCH: 32 pg (ref 26.0–34.0)
MCHC: 35.4 g/dL (ref 30.0–36.0)
MCV: 90.4 fL (ref 78.0–100.0)
MONO ABS: 0.7 10*3/uL (ref 0.1–1.0)
Monocytes Relative: 8 %
Neutro Abs: 6.7 10*3/uL (ref 1.7–7.7)
Neutrophils Relative %: 78 %
PLATELETS: 225 10*3/uL (ref 150–400)
RBC: 4.59 MIL/uL (ref 4.22–5.81)
RDW: 12.2 % (ref 11.5–15.5)
WBC: 8.7 10*3/uL (ref 4.0–10.5)

## 2017-10-20 MED ORDER — SODIUM CHLORIDE 0.9 % IV BOLUS (SEPSIS)
500.0000 mL | Freq: Once | INTRAVENOUS | Status: AC
  Administered 2017-10-20: 500 mL via INTRAVENOUS

## 2017-10-20 MED ORDER — SODIUM CHLORIDE 0.9 % IV SOLN
1000.0000 mL | INTRAVENOUS | Status: DC
  Administered 2017-10-21 – 2017-10-22 (×2): 1000 mL via INTRAVENOUS

## 2017-10-20 NOTE — ED Notes (Signed)
Pt's daughter came in and spoke with this RN stating that something needs to be done because her father cannot live by himself in his current state. Daughter is closest living relative and lives hours away and "can't be coming up here every week". States he has not ate since he was d/c yesterday and was making comments about not living any longer. Also states that father was "fighting" with everyone this evening.

## 2017-10-20 NOTE — ED Notes (Signed)
Sitter at bedside at this time 

## 2017-10-20 NOTE — ED Triage Notes (Addendum)
Pt brought in by EMS under IVC. C/o lower abd pain with 1 episode of D/. Has had N/, no V/. D/c from the ED yesterday. According to EMS pt's daughter took out IVC d/t pt refusing to eat and made comments about shooting himself and refusing all medications. Pt denies HI/SI, AVH to this RN. States he does not want to eat d/t abd pain.

## 2017-10-20 NOTE — ED Notes (Signed)
Pt alert to self, place, and why he is here, unable to tell month or day of week. Pt denies SI/HI. Pt denies hearing voices other than the staff he can hear in the ED. Pt denies hallucinations. Pt reports he is sick and having abd pain.

## 2017-10-20 NOTE — ED Provider Notes (Addendum)
[facility] EMERGENCY DEPARTMENT Provider Note   CSN: [id number] Arrival date & time: 10/20/17  2135     History   Chief Complaint Chief Complaint  Patient presents with  . Abdominal Pain    brought under IVC    HPI Daniel Barnett is a 71 y.o. male.  Patient is a 71 year old male who presents to the emergency department by EMS because of abdominal pain.  This patient was seen in the emergency department on November 1 with abdominal pain.  He was worked up.  He had a chest x-ray which showed chronic which hyperinflation and bronchitic changes consistent with COPD.  He had a CT scan of the abdomen which showed constipation.  There was no acute intra-abdominal or pelvic pathology noted.  The blood work at that time was mostly unremarkable.  The patient previously had a caregiver that this caregiver is no longer available to him.  He the patient states that he has been without his caregiver for nearly 2 weeks.  The patient has a daughter.  The daughter brought the patient back to the emergency department by EMS and states that he is not able to be at home.  He is not eating well.  He is not taking his medications.  The daughter also had involuntary commitment papers taken down because she says that he is hearing voices, is seeing things that are not there, has problems with depression, and says he wants to give up and at times has talked about killing himself.  The patient denies any suicidal or homicidal ideations.  He has no plan for either of these activities.  The patient does acknowledge that he hears voices and he does acknowledge that he sees things that are not there.  Patient states he is not taking his medications because his stomach bothers him and he feels as though he will vomit them back up.      Past Medical History:  Diagnosis Date  . Anxiety   . Gout   . Hyperlipidemia   . Hypertension   . Psoriasis     Patient Active Problem List   Diagnosis Date Noted  .  Delirium 09/26/2017  . Auditory hallucination 09/26/2017  . Elevated troponin 09/26/2017  . Encephalopathy 09/25/2017  . Abdominal pain   . Hyponatremia   . Proctitis 08/23/2017  . CN (constipation) 04/18/2015  . Insomnia 04/02/2014  . BMI 30.0-30.9,adult 04/02/2014  . Essential hypertension, benign 04/13/2013  . Hyperlipidemia with target LDL less than 100 04/13/2013    Past Surgical History:  Procedure Laterality Date  . TONSILLECTOMY    . VASECTOMY         Home Medications    Prior to Admission medications   Medication Sig Start Date End Date Taking? Authorizing Provider  allopurinol (ZYLOPRIM) 100 MG tablet Take 1 tablet (100 mg total) by mouth daily. 12/25/16   Chevis Pretty, FNP  ALPRAZolam Duanne Moron) 0.25 MG tablet TAKE 1 TABLET AT BEDTIME  FOR  ANXIETY 09/19/17   Evelina Dun A, FNP  cyanocobalamin 500 MCG tablet Take 1 tablet (500 mcg total) by mouth daily. 09/27/17   Orson Eva, MD  folic acid (FOLVITE) 762 MCG tablet Take 1 tablet (400 mcg total) by mouth daily. 08/29/17 08/29/18  Reyne Dumas, MD  lisinopril (PRINIVIL,ZESTRIL) 20 MG tablet Take 1 tablet (20 mg total) by mouth daily. 08/28/17 08/28/18  Reyne Dumas, MD  magnesium oxide (MAG-OX) 400 (241.3 Mg) MG tablet Take 1 tablet (400 mg total) by mouth daily.  08/28/17   Reyne Dumas, MD  metoprolol succinate (TOPROL-XL) 50 MG 24 hr tablet TAKE 1 TABLET DAILY WITH OR IMMEDIATELY FOLLOWING A MEAL 05/22/17   Chipper Herb, MD  pantoprazole (PROTONIX) 40 MG tablet Take 1 tablet (40 mg total) by mouth daily. 08/28/17   Reyne Dumas, MD  polyethylene glycol (MIRALAX / GLYCOLAX) packet Take 17 g by mouth daily. 08/30/17   Reyne Dumas, MD  polyethylene glycol (MIRALAX / GLYCOLAX) packet Take 17 g by mouth daily. 10/18/17   Margette Fast, MD  pravastatin (PRAVACHOL) 20 MG tablet TAKE 1 TABLET EVERY DAY 05/22/17   Chipper Herb, MD  senna-docusate (SENOKOT-S) 8.6-50 MG tablet Take 1 tablet by mouth daily. 10/18/17 10/25/17   Long, Wonda Olds, MD  thiamine 100 MG tablet Take 1 tablet (100 mg total) by mouth daily. 08/29/17   Reyne Dumas, MD    Family History Family History  Problem Relation Age of Onset  . Colon cancer Neg Hx   . Colon polyps Neg Hx     Social History Social History   Tobacco Use  . Smoking status: Former Smoker    Types: Cigarettes  . Smokeless tobacco: Never Used  . Tobacco comment: pt states he quit approx 3 months ago (stated on 10/20/17)  Substance Use Topics  . Alcohol use: No    Frequency: Never    Comment: pt states he quit three months ago (stated on 10/20/17)  . Drug use: No     Allergies   Ciprofloxacin   Review of Systems Review of Systems  Constitutional: Positive for activity change, appetite change and fatigue. Negative for fever.       All ROS Neg except as noted in HPI  HENT: Negative for nosebleeds.   Eyes: Negative for photophobia and discharge.  Respiratory: Positive for cough. Negative for shortness of breath and wheezing.   Cardiovascular: Negative for chest pain, palpitations and leg swelling.  Gastrointestinal: Positive for abdominal pain and constipation. Negative for blood in stool and vomiting.  Genitourinary: Negative for dysuria, frequency and hematuria.  Musculoskeletal: Negative for arthralgias, back pain and neck pain.  Skin: Negative.   Neurological: Negative for dizziness, seizures and speech difficulty.  Psychiatric/Behavioral: Positive for hallucinations. Negative for confusion and suicidal ideas.       Hearing voices     Physical Exam Updated Vital Signs BP (!) 160/106   Pulse 100   Temp 97.7 F (36.5 C) (Oral)   Resp 20   Ht 6\' 1"  (1.854 m)   Wt 95.3 kg (210 lb)   SpO2 100%   BMI 27.71 kg/m   Physical Exam  Constitutional: He is oriented to person, place, and time. He appears well-developed and well-nourished.  Non-toxic appearance.  HENT:  Head: Normocephalic.  Right Ear: Tympanic membrane and external ear normal.    Left Ear: Tympanic membrane and external ear normal.  Dried mucus in the back of the throat.  Eyes: EOM and lids are normal. Pupils are equal, round, and reactive to light.  Neck: Normal range of motion. Neck supple. Carotid bruit is not present.  Cardiovascular: Normal rate, regular rhythm, normal heart sounds, intact distal pulses and normal pulses.  Pulmonary/Chest: Effort normal. No respiratory distress. He has rhonchi.  Frequent cough  Abdominal: Soft. There is tenderness in the right upper quadrant, right lower quadrant and periumbilical area. There is no rigidity and no guarding.  Genitourinary:  Genitourinary Comments: Soiled adult diaper in place.  Musculoskeletal: Normal range of motion.  He exhibits no edema.  Lymphadenopathy:       Head (right side): No submandibular adenopathy present.       Head (left side): No submandibular adenopathy present.    He has no cervical adenopathy.  Neurological: He is alert and oriented to person, place, and time. He has normal strength. No cranial nerve deficit or sensory deficit.  Skin: Skin is warm and dry.  Psychiatric: He has a normal mood and affect. His speech is normal.  Nursing note and vitals reviewed.    ED Treatments / Results  Labs (all labs ordered are listed, but only abnormal results are displayed) Labs Reviewed  COMPREHENSIVE METABOLIC PANEL  LIPASE, BLOOD  AMMONIA  CBC WITH DIFFERENTIAL/PLATELET    EKG  EKG Interpretation None       Radiology No results found.  Procedures Fecal disimpaction Date/Time: 10/21/2017 1:13 AM Performed by: Lily Kocher, PA-C Authorized by: Lily Kocher, PA-C  Consent: Verbal consent obtained. Risks and benefits: risks, benefits and alternatives were discussed Consent given by: patient Patient understanding: patient states understanding of the procedure being performed Test results: test results available and properly labeled Imaging studies: imaging studies  available Patient identity confirmed: arm band Time out: Immediately prior to procedure a "time out" was called to verify the correct patient, procedure, equipment, support staff and site/side marked as required. Local anesthesia used: no  Anesthesia: Local anesthesia used: no  Sedation: Patient sedated: no  Patient tolerance: Patient tolerated the procedure well with no immediate complications    (including critical care time)  Medications Ordered in ED Medications  sodium chloride 0.9 % bolus 500 mL (not administered)    Followed by  0.9 %  sodium chloride infusion (not administered)     Initial Impression / Assessment and Plan / ED Course  I have reviewed the triage vital signs and the nursing notes.  Pertinent labs & imaging results that were available during my care of the patient were reviewed by me and considered in my medical decision making (see chart for details).  Clinical Course as of Oct 31 1133  Fri Oct 25, 2017  1902 Pt cleared by psychiatry. Pt will likely end up needing placement, but not medical necessity to admit. Daughter made aware by social work. To stay overnight in ED for discharge tomorrow. Will need prescription for antibiotics for UTI as outlined by Dr Sabra Heck earlier today.   [SK]    Clinical Course User Index [SK] Virgel Manifold, MD      Final Clinical Impressions(s) / ED Diagnoses MDM Patient has had intermittent episodes of elevated blood pressure, but otherwise vital signs have been within normal limits.  I reviewed the emergency department visit from November 1.  I reviewed the x-rays and CT scans. Recheck.  Patient states he feels a little better after receiving IV fluids. Pt seen with me by Dr D. Ray  Ammonia level today is normal at 11.  Conference of metabolic panel shows the total bilirubin to be elevated at 1.3, otherwise normal.  The anion gap is normal at 13.  Lipase is normal at 36.  The white blood cell count is normal and the  remainder of the complete blood count is well within normal limits. A portable chest x-ray shows shallow inspiration, but the lungs are grossly clear there is no blunting of the costophrenic angles no evidence of any pneumothorax or pneumonia.  Patient presented to the emergency department with IVC papers. He admits to hearing voices and hallucinations (seeing  people who are not there).  A TTS consult has been requested. Recheck.  No significant change in patient's condition.  Pt's care to be continued by Dr Meda Coffee. Tomi Bamberger.   Final diagnoses:  Generalized abdominal pain  Hallucinations  Current moderate episode of major depressive disorder, unspecified whether recurrent (HCC)  Urinary tract infection without hematuria, site unspecified  Other acute pulmonary embolism without acute cor pulmonale [facility])    ED Discharge Orders    None       Lily Kocher, PA-C 10/21/17 1352    Pattricia Boss, MD 10/22/17 [place], PA-C 10/31/17 1135    Pattricia Boss, MD 11/02/17 0700

## 2017-10-21 ENCOUNTER — Telehealth: Payer: Self-pay | Admitting: Nurse Practitioner

## 2017-10-21 LAB — LIPASE, BLOOD: LIPASE: 36 U/L (ref 11–51)

## 2017-10-21 LAB — COMPREHENSIVE METABOLIC PANEL
ALBUMIN: 3.9 g/dL (ref 3.5–5.0)
ALT: 36 U/L (ref 17–63)
ANION GAP: 13 (ref 5–15)
AST: 30 U/L (ref 15–41)
Alkaline Phosphatase: 70 U/L (ref 38–126)
BUN: 25 mg/dL — AB (ref 6–20)
CHLORIDE: 100 mmol/L — AB (ref 101–111)
CO2: 25 mmol/L (ref 22–32)
Calcium: 9.4 mg/dL (ref 8.9–10.3)
Creatinine, Ser: 1.01 mg/dL (ref 0.61–1.24)
GFR calc Af Amer: >60 mL/min (ref >60)
GLUCOSE: 92 mg/dL (ref 65–99)
POTASSIUM: 3.9 mmol/L (ref 3.5–5.1)
Sodium: 138 mmol/L (ref 135–145)
Total Bilirubin: 1.3 mg/dL — ABNORMAL HIGH (ref 0.3–1.2)
Total Protein: 7.1 g/dL (ref 6.5–8.1)

## 2017-10-21 LAB — AMMONIA: Ammonia: 11 umol/L (ref 9–35)

## 2017-10-21 NOTE — BH Assessment (Signed)
Received phone call from [employer], patient declined related to limited criteria.

## 2017-10-21 NOTE — ED Notes (Signed)
Daniel Barnett, with [facility] called to inform, Gwenlyn Found, NP wants additional family input regarding IVC papers and situation prior to final disposition.

## 2017-10-21 NOTE — Progress Notes (Signed)
Patient meets criteria for inpatient treatment. CSW faxed referrals to the following inpatient facilities for review:  [facility], [facility], [facility], [facility], [facility], [facility]   TTS will continue to seek bed placement.  Radonna Ricker MSW, [place] Disposition [phone]

## 2017-10-21 NOTE — BH Assessment (Addendum)
Tele Assessment Note   Patient Name: Daniel Barnett MRN: [id number] Referring Physician: Dr. Tomi Bamberger, MD Location of Patient:  [facility] Location of Provider: [place]  Daniel Barnett is an 71 y.o. male who was IVC'd by his daughter, [name] because she says that he is hearing voices, is seeing things that are not there, has problems with depression, and says he wants to give up and at times has talked about killing himself.  The patient denies any suicidal or homicidal ideations.  He has no plan for either of these activities.  During assessment, pt reports feelings of depression due to his stomach pains but adamantly states "I do not want to hurt myself or anyone else."  Pt denies A/V hallucinations during assessment.  According to pt's records, patient does acknowledge that he hears voices and he does acknowledge that he sees things that are not there.  Pt reports not eating because his stomach bothers him but denies wanting to hurt himself.  Pt is able to contract for safety  Pt currently lives alone since his caregiver stop working with him 2 weeks ago.  According to pt's records, Patient was not recommended for SNF. Patient may be in need of long term ALF. Patient's daughter will need to make arrangements to either stay with patient/locate another care giver or take him home with her to seek placement in her area  Patient was wearing hospital gown and appeared  appropriately groomed.  Pt was alert throughout the assessment.  Patient made fair eye contact and had normal psychomotor activity.  Patient spoke in a normal voice without pressured speech.  Pt expressed feeling depressed due to his current circumstances  Pt's affect appeared normal  and congruent with stated mood. Pt's thought process was coherent and logical.  Pt presented with good insight and judgement.  Pt did not appear to be responding to internal stimuli.  Disposition: Case  discussed with [facility] provider, Lindon Romp, NP who recommends additional information is gather in a family collateral from the pt's daughter who IVC'd pt before disposition is given.  LPCA discussed recommendation with pt's nurse, Baldo Ash, RN and ER provider, Dr. Tomi Bamberger, MD. .   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 Anxiety   . Gout   . Hyperlipidemia   . Hypertension   . Psoriasis     Past Surgical History:  Procedure Laterality Date  . TONSILLECTOMY    . VASECTOMY      Family History:  Family History  Problem Relation Age of Onset  . Colon cancer Neg Hx   . Colon polyps Neg Hx     Social History:  reports that he has quit smoking. His smoking use included cigarettes. he has never used smokeless tobacco. He reports that he does not drink alcohol or use drugs.  Additional Social History:  Alcohol / Drug Use Pain Medications: See MARs Prescriptions: See MARs Over the Counter: See MARs History of alcohol / drug use?: Yes Longest period of sobriety (when/how long): Pt reports not drinking for the past 2 months Substance #1 Name of Substance 1: Alcohol 1 - Age of First Use: unknown 1 - Amount (size/oz): unknown 1 - Frequency: unknown 1 - Duration: unknown 1 - Last Use / Amount: 2 months ago  CIWA: CIWA-Ar BP: (!) 136/94 Pulse Rate: 81 COWS:    PATIENT STRENGTHS: (choose at least two)  Average or above average intelligence Communication skills Supportive family/friends  Allergies:  Allergies  Allergen Reactions  . Ciprofloxacin     Home Medications:  (Not in a hospital admission)  OB/GYN Status:  No LMP for male patient.  General Assessment Data Location of Assessment: [facility] TTS Assessment: In system Is this a Tele or Face-to-Face Assessment?: Tele Assessment Is this an Initial Assessment or a Re-assessment for this encounter?: Initial Assessment Marital status:  Divorced Is patient pregnant?: No Pregnancy Status: No Living Arrangements: Alone(Pt reports currently living alone) Can pt return to current living arrangement?: Yes Admission Status: Involuntary(Pt was IVC'd by his daugher for self neglect) Is patient capable of signing voluntary admission?: No(Pt has the mental capacity to sign himself but pt was IVC'd) Referral Source: Self/Family/Friend(Pt was IVC'd by his daughter) Insurance type: [employer]     Crisis Care Plan Living Arrangements: Alone(Pt reports currently living alone)  Education Status Is patient currently in school?: No  Risk to self with the past 6 months Suicidal Ideation: No Has patient been a risk to self within the past 6 months prior to admission? : No Suicidal Intent: No Has patient had any suicidal intent within the past 6 months prior to admission? : No Is patient at risk for suicide?: No Suicidal Plan?: No Has patient had any suicidal plan within the past 6 months prior to admission? : No Access to Means: No What has been your use of drugs/alcohol within the last 12 months?: Pt last drinked alcohol 2 months ago Previous Attempts/Gestures: No Triggers for Past Attempts: Other (Comment) Intentional Self Injurious Behavior: None Family Suicide History: No Recent stressful life event(s): Turmoil (Comment) Persecutory voices/beliefs?: No Depression: Yes Depression Symptoms: Tearfulness, Loss of interest in usual pleasures Substance abuse history and/or treatment for substance abuse?: No Suicide prevention information given to non-admitted patients: Not applicable  Risk to Others within the past 6 months Homicidal Ideation: No Does patient have any lifetime risk of violence toward others beyond the six months prior to admission? : No Thoughts of Harm to Others: No Current Homicidal Intent: No Current Homicidal Plan: No Access to Homicidal Means: No History of harm to others?: No Assessment of  Violence: None Noted Does patient have access to weapons?: No Criminal Charges Pending?: No Does patient have a court date: Yes(IVC hearing date unknown) Court Date: (unknown by patient) Is patient on probation?: No  Psychosis Hallucinations: None noted Delusions: None noted  Mental Status Report Appearance/Hygiene: In hospital gown Eye Contact: Good Motor Activity: Freedom of movement Speech: Soft, Logical/coherent Level of Consciousness: Alert Mood: Depressed Affect: Depressed Anxiety Level: None Thought Processes: Coherent, Relevant Judgement: Partial Orientation: Person, Place, Appropriate for developmental age, Situation Obsessive Compulsive Thoughts/Behaviors: None  Cognitive Functioning Memory: Recent Intact IQ: Average Impulse Control: Good Appetite: Good Sleep: Decreased Total Hours of Sleep: 2 Vegetative Symptoms: Staying in bed  ADLScreening [facility] Assessment Services) Patient's cognitive ability adequate to safely complete daily activities?: Yes Patient able to express need for assistance with ADLs?: Yes Independently performs ADLs?: No(Pt report needing assistance due to medical conditions)  Prior Inpatient Therapy Prior Inpatient Therapy: No  Prior Outpatient Therapy Prior Outpatient Therapy: Yes Prior Therapy Dates: unknown Prior Therapy Facilty/Provider(s): Pt reports his therapist's name is Anderson Malta Reason for Treatment: Depression Does patient have an ACCT team?: No Does patient have Intensive In-House Services?  : No Does patient have [facility] services? : No Does patient have P4CC services?: No  ADL Screening (condition at time of admission)  Patient's cognitive ability adequate to safely complete daily activities?: Yes Patient able to express need for assistance with ADLs?: Yes Independently performs ADLs?: No(Pt report needing assistance due to medical conditions)       Abuse/Neglect Assessment (Assessment to be complete while patient is  alone) Abuse/Neglect Assessment Can Be Completed: Yes Physical Abuse: Denies Verbal Abuse: Denies Sexual Abuse: Denies Exploitation of patient/patient's resources: Denies Self-Neglect: Denies     Regulatory affairs officer (For Healthcare) Does Patient Have a Medical Advance Directive?: Yes Does patient want to make changes to medical advance directive?: No - Patient declined Type of Advance Directive: Living will Copy of Living Will in Chart?: No - copy requested    Additional Information 1:1 In Past 12 Months?: No CIRT Risk: No Elopement Risk: No Does patient have medical clearance?: Yes     Disposition: Case discussed with [facility] provider, Lindon Romp, NP who recommends additional information is gather in a family collateral from the pt's daughter who IVC'd pt before disposition is given.  LPCA discussed recommendation with pt's nurse, Baldo Ash, RN and ER provider, Dr. Tomi Bamberger, MD.  Disposition Initial Assessment Completed for this Encounter: Yes Disposition of Patient: Other dispositions(Per Lindon Romp, NP Family Collateral needed)  This service was provided via telemedicine using a 2-way, interactive audio and video technology.  Names of all persons participating in this telemedicine service and their role in this encounter.               Brennden Masten L Deantre Bourdon, MS, LPCA, [place] 10/21/2017 6:16 AM

## 2017-10-21 NOTE — ED Provider Notes (Signed)
06:48 AM Daniel Barnett, TTS, states their PA recommends getting further information from the daughter, but she isn't answering her phone. They will try to reach her later today. Psychiatrist consult ordered.    Rolland Porter, MD 10/21/17 [phone]

## 2017-10-21 NOTE — Telephone Encounter (Signed)
Please review

## 2017-10-21 NOTE — BH Assessment (Addendum)
[place] Assessment Progress Note  Pt's daughter and IVC petitioner, [name], called back to provide collateral information. [name] shares that she feels pt needs some type of geropsych treatment, as he seems to be getting worse mentally. She reports that he's been talking out of his head, saying that voices are talking to him (telling him he's the devil and that he's dead), displaying paranoid delusions (school buses are passing back and forth in front of his house to look at "the crazy man"; people are looking in his window, making fun of him), not taking medications, and not eating.   Case staffed with Ricky Ala, NP. IP geropsych is recommended.  Kenna Gilbert. Lovena Le, [place], [place], LPCA Counselor

## 2017-10-22 ENCOUNTER — Other Ambulatory Visit: Payer: Self-pay

## 2017-10-22 MED ORDER — PANTOPRAZOLE SODIUM 40 MG PO TBEC
40.0000 mg | DELAYED_RELEASE_TABLET | Freq: Every day | ORAL | Status: DC
Start: 2017-10-22 — End: 2017-10-30
  Administered 2017-10-22 – 2017-10-30 (×7): 40 mg via ORAL
  Filled 2017-10-22 (×8): qty 1

## 2017-10-22 MED ORDER — POLYETHYLENE GLYCOL 3350 17 G PO PACK
17.0000 g | PACK | Freq: Every day | ORAL | Status: DC
  Administered 2017-10-22 – 2017-10-30 (×7): 17 g via ORAL
  Filled 2017-10-22 (×7): qty 1

## 2017-10-22 MED ORDER — MAGNESIUM OXIDE 400 (241.3 MG) MG PO TABS
400.0000 mg | ORAL_TABLET | Freq: Every day | ORAL | Status: DC
  Administered 2017-10-22 – 2017-10-30 (×7): 400 mg via ORAL
  Filled 2017-10-22 (×8): qty 1

## 2017-10-22 MED ORDER — SENNOSIDES-DOCUSATE SODIUM 8.6-50 MG PO TABS
1.0000 | ORAL_TABLET | Freq: Every day | ORAL | Status: DC
  Administered 2017-10-22 – 2017-10-30 (×7): 1 via ORAL
  Filled 2017-10-22 (×11): qty 1

## 2017-10-22 MED ORDER — THIAMINE HCL 100 MG PO TABS
100.0000 mg | ORAL_TABLET | Freq: Every day | ORAL | Status: DC
  Administered 2017-10-22 – 2017-10-30 (×7): 100 mg via ORAL
  Filled 2017-10-22 (×11): qty 1

## 2017-10-22 MED ORDER — FOLIC ACID 1 MG PO TABS
500.0000 ug | ORAL_TABLET | Freq: Every day | ORAL | Status: DC
  Administered 2017-10-22 – 2017-10-30 (×7): 0.5 mg via ORAL
  Filled 2017-10-22 (×2): qty 1
  Filled 2017-10-22 (×3): qty 0.5
  Filled 2017-10-22: qty 1
  Filled 2017-10-22 (×2): qty 0.5
  Filled 2017-10-22: qty 1
  Filled 2017-10-22: qty 0.5
  Filled 2017-10-22: qty 1
  Filled 2017-10-22: qty 0.5
  Filled 2017-10-22 (×3): qty 1

## 2017-10-22 MED ORDER — LISINOPRIL 10 MG PO TABS
20.0000 mg | ORAL_TABLET | Freq: Every day | ORAL | Status: DC
  Administered 2017-10-22 – 2017-10-27 (×5): 20 mg via ORAL
  Filled 2017-10-22 (×7): qty 2

## 2017-10-22 MED ORDER — METOPROLOL SUCCINATE ER 50 MG PO TB24
50.0000 mg | ORAL_TABLET | Freq: Every day | ORAL | Status: DC
  Administered 2017-10-22 – 2017-10-28 (×6): 50 mg via ORAL
  Filled 2017-10-22 (×9): qty 1

## 2017-10-22 MED ORDER — PRAVASTATIN SODIUM 10 MG PO TABS
20.0000 mg | ORAL_TABLET | Freq: Every day | ORAL | Status: DC
  Administered 2017-10-23 – 2017-10-28 (×4): 20 mg via ORAL
  Filled 2017-10-22 (×2): qty 2
  Filled 2017-10-22: qty 1
  Filled 2017-10-22 (×7): qty 2

## 2017-10-22 MED ORDER — ALPRAZOLAM 0.5 MG PO TABS
0.2500 mg | ORAL_TABLET | Freq: Every evening | ORAL | Status: DC | PRN
  Administered 2017-10-28: 0.25 mg via ORAL
  Filled 2017-10-22: qty 1

## 2017-10-22 MED ORDER — ALLOPURINOL 100 MG PO TABS
100.0000 mg | ORAL_TABLET | Freq: Every day | ORAL | Status: DC
  Administered 2017-10-22 – 2017-10-30 (×8): 100 mg via ORAL
  Filled 2017-10-22 (×11): qty 1

## 2017-10-22 MED ORDER — VITAMIN B-12 1000 MCG PO TABS
500.0000 ug | ORAL_TABLET | Freq: Every day | ORAL | Status: DC
  Administered 2017-10-22 – 2017-10-30 (×7): 500 ug via ORAL
  Filled 2017-10-22 (×11): qty 0.5

## 2017-10-22 NOTE — Telephone Encounter (Signed)
Spoke with daughter and she advised that patient is being held in the ER at [facility] waiting on placement for a geriatric mental facility. She states that once he gets straightened out mentally that she would still like help with helping to get him placed in an appropriate facility. Advised daughter to keep [name] updated on patients status and we would assist however we can. She also advised that his caregiver Marguerite Olea had been taking the patients xanax for his own use. He admitted to daughter and she just wanted someone to know. Once questioned the caregiver returned what xanax he still had to daughter.

## 2017-10-22 NOTE — BHH Counselor (Signed)
TTS reassessment: Tried to do reassessment but machine was not working. Called IT and put a ticket in for the machine.   [address] [place], LCAS

## 2017-10-22 NOTE — ED Notes (Signed)
Pt refusing to take daily medications.

## 2017-10-22 NOTE — BHH Counselor (Signed)
Collateral gathered by [name] on 10/21/17   Pt's daughter and IVC petitioner, [name], called back to provide collateral information. [name] shares that she feels pt needs some type of geropsych treatment, as he seems to be getting worse mentally. She reports that he's been talking out of his head, saying that voices are talking to him (telling him he's the devil and that he's dead), displaying paranoid delusions (school buses are passing back and forth in front of his house to look at "the crazy man"; people are looking in his window, making fun of him), not taking medications, and not eating.   Reassessment from today 10/22/17  TTS reassessment: Pt was flat and depressed and states that he has been having difficulty "getting anything down". He states that he hasn't had an appetite recently and is just eating "here and there". Pt states that he feels "depressed" because people are talking about him all the time. Pt appears paranoid and states that he hears voices talking in his head. He could not state what they were saying to him but states "I hear all kinds of things in my head". Pt states that he is depressed and hasn't been sleeping. He states he gets 2-3 hours of sleep a night. Pt appeared somewhat confused but was oriented to date, time and place. He states that his "friend" that he lives with has a gun but they 'took it from the house". Pt meets inpatient criteria for geropsych admission.   [address] [place], LCAS

## 2017-10-22 NOTE — ED Notes (Signed)
Given ensure

## 2017-10-23 LAB — CBC WITH DIFFERENTIAL/PLATELET
Basophils Absolute: 0.1 10*3/uL (ref 0.0–0.1)
Basophils Relative: 1 %
EOS ABS: 0.1 10*3/uL (ref 0.0–0.7)
EOS PCT: 1 %
HCT: 39.9 % (ref 39.0–52.0)
Hemoglobin: 14.1 g/dL (ref 13.0–17.0)
Lymphocytes Relative: 11 %
Lymphs Abs: 1 10*3/uL (ref 0.7–4.0)
MCH: 32 pg (ref 26.0–34.0)
MCHC: 35.3 g/dL (ref 30.0–36.0)
MCV: 90.5 fL (ref 78.0–100.0)
MONO ABS: 0.7 10*3/uL (ref 0.1–1.0)
MONOS PCT: 7 %
Neutro Abs: 7 10*3/uL (ref 1.7–7.7)
Neutrophils Relative %: 80 %
PLATELETS: 181 10*3/uL (ref 150–400)
RBC: 4.41 MIL/uL (ref 4.22–5.81)
RDW: 12.3 % (ref 11.5–15.5)
WBC: 8.8 10*3/uL (ref 4.0–10.5)

## 2017-10-23 LAB — URINALYSIS, ROUTINE W REFLEX MICROSCOPIC
BILIRUBIN URINE: NEGATIVE
Glucose, UA: NEGATIVE mg/dL
Ketones, ur: 5 mg/dL — AB
Nitrite: NEGATIVE
Protein, ur: 30 mg/dL — AB
SPECIFIC GRAVITY, URINE: 1.019 (ref 1.005–1.030)
SQUAMOUS EPITHELIAL / LPF: NONE SEEN
pH: 5 (ref 5.0–8.0)

## 2017-10-23 LAB — COMPREHENSIVE METABOLIC PANEL
ALT: 37 U/L (ref 17–63)
AST: 29 U/L (ref 15–41)
Albumin: 3.1 g/dL — ABNORMAL LOW (ref 3.5–5.0)
Alkaline Phosphatase: 69 U/L (ref 38–126)
Anion gap: 10 (ref 5–15)
BUN: 19 mg/dL (ref 6–20)
CHLORIDE: 100 mmol/L — AB (ref 101–111)
CO2: 21 mmol/L — AB (ref 22–32)
CREATININE: 0.97 mg/dL (ref 0.61–1.24)
Calcium: 8.7 mg/dL — ABNORMAL LOW (ref 8.9–10.3)
GFR calc Af Amer: >60 mL/min (ref >60)
GFR calc non Af Amer: >60 mL/min (ref >60)
Glucose, Bld: 101 mg/dL — ABNORMAL HIGH (ref 65–99)
Potassium: 3.9 mmol/L (ref 3.5–5.1)
SODIUM: 131 mmol/L — AB (ref 135–145)
Total Bilirubin: 1 mg/dL (ref 0.3–1.2)
Total Protein: 6.4 g/dL — ABNORMAL LOW (ref 6.5–8.1)

## 2017-10-23 LAB — CK: CK TOTAL: 73 U/L (ref 49–397)

## 2017-10-23 LAB — I-STAT CG4 LACTIC ACID, ED: LACTIC ACID, VENOUS: 1.03 mmol/L (ref 0.5–1.9)

## 2017-10-23 MED ORDER — DEXTROSE 5 % IV SOLN
1.0000 g | Freq: Once | INTRAVENOUS | Status: AC
  Administered 2017-10-23: 1 g via INTRAVENOUS
  Filled 2017-10-23: qty 10

## 2017-10-23 MED ORDER — DEXTROSE 5 % IV SOLN
1.0000 g | INTRAVENOUS | Status: DC
  Administered 2017-10-24 – 2017-10-25 (×2): 1 g via INTRAVENOUS
  Filled 2017-10-23 (×2): qty 10

## 2017-10-23 MED ORDER — SODIUM CHLORIDE 0.9 % IV BOLUS (SEPSIS)
1000.0000 mL | Freq: Once | INTRAVENOUS | Status: AC
  Administered 2017-10-23: 1000 mL via INTRAVENOUS

## 2017-10-23 NOTE — Evaluation (Addendum)
Physical Therapy Evaluation Patient Details Name: Daniel Barnett MRN: [id number] DOB: 01-25-1946 Today's Date: 10/23/2017   History of Present Illness  Patient is 71 y/o male with c/o abdominal pain and not eating  Clinical Impression  Patient functioning near baseline and able to ambulate in hallway and transfer to commode in bathroom without loss of balance.  Patient tolerated sitting up at bedside to eat breakfast after therapy - sitter in room supervising.  Patient appears cable of functioning safely for functional ADLs and gait with supervision required.  PLAN: patient discharged from physical therapy to care of nursing for ambulation daily as tolerated for length of stay.    Follow Up Recommendations Home health PT;Supervision/Assistance - 24 hour    Equipment Recommendations  None recommended by PT    Recommendations for Other Services       Precautions / Restrictions Precautions Precautions: Fall Restrictions Weight Bearing Restrictions: No      Mobility  Bed Mobility Overal bed mobility: Needs Assistance       Supine to sit: Supervision Sit to supine: Supervision      Transfers Overall transfer level: Needs assistance Equipment used: Rolling walker (2 wheeled) Transfers: Sit to/from [facility] Sit to Stand: Min guard Stand pivot transfers: Min guard          Ambulation/Gait Ambulation/Gait assistance: Min guard Ambulation Distance (Feet): 35 Feet Assistive device: Rolling walker (2 wheeled) Gait Pattern/deviations: Decreased step length - right;Decreased step length - left;Decreased stride length   Gait velocity interpretation: Below normal speed for age/gender General Gait Details: Patient demonstrates slow slighlty labored cadence without loss of balance, limited secondary to c/o fatigue  Stairs            Wheelchair Mobility    Modified Rankin (Stroke Patients Only)       Balance Overall balance assessment: Needs  assistance Sitting-balance support: No upper extremity supported Sitting balance-Leahy Scale: Good     Standing balance support: Bilateral upper extremity supported;During functional activity Standing balance-Leahy Scale: Fair                               Pertinent Vitals/Pain Pain Assessment: No/denies pain    Home Living Family/patient expects to be discharged to:: Private residence Living Arrangements: Non-relatives/Friends Available Help at Discharge: Family Type of Home: House Home Access: Stairs to enter Entrance Stairs-Rails: Left Entrance Stairs-Number of Steps: 4 Home Layout: One level Home Equipment: Walker - 2 wheels Additional Comments: no steps in front entrance, 1 six inch step down off concrete porch per patient    Prior Function Level of Independence: Independent with assistive device(s)   Gait / Transfers Assistance Needed: Has RW but usually does not use(usualy ambulates without RW, has no SPC)  ADL's / Homemaking Assistance Needed: friend helps with ADLs        Hand Dominance        Extremity/Trunk Assessment   Upper Extremity Assessment Upper Extremity Assessment: Generalized weakness    Lower Extremity Assessment Lower Extremity Assessment: Generalized weakness    Cervical / Trunk Assessment Cervical / Trunk Assessment: Normal  Communication   Communication: No difficulties  Cognition Arousal/Alertness: Awake/alert Behavior During Therapy: WFL for tasks assessed/performed Overall Cognitive Status: Within Functional Limits for tasks assessed  General Comments      Exercises     Assessment/Plan    PT Assessment All further PT needs can be met in the next venue of care  PT Problem List Decreased strength;Decreased activity tolerance;Decreased mobility       PT Treatment Interventions      PT Goals (Current goals can be found in the Care Plan section)  Acute  Rehab PT Goals Patient Stated Goal: (return home with assistance) PT Goal Formulation: With patient Time For Goal Achievement: 10/23/17 Potential to Achieve Goals: Good    Frequency     Barriers to discharge        Co-evaluation               AM-PAC PT "6 Clicks" Daily Activity  Outcome Measure Difficulty turning over in bed (including adjusting bedclothes, sheets and blankets)?: None Difficulty moving from lying on back to sitting on the side of the bed? : A Little Difficulty sitting down on and standing up from a chair with arms (e.g., wheelchair, bedside commode, etc,.)?: A Little Help needed moving to and from a bed to chair (including a wheelchair)?: A Little Help needed walking in hospital room?: A Little Help needed climbing 3-5 steps with a railing? : A Little 6 Click Score: 19    End of Session Equipment Utilized During Treatment: Gait belt Activity Tolerance: Patient tolerated treatment well;Patient limited by fatigue Patient left: in bed;with call bell/phone within reach;with nursing/sitter in room Nurse Communication: Mobility status PT Visit Diagnosis: Unsteadiness on feet (R26.81);Other abnormalities of gait and mobility (R26.89);Muscle weakness (generalized) (M62.81)    Time: 3295-1884 PT Time Calculation (min) (ACUTE ONLY): 32 min   Charges:   PT Evaluation $PT Eval Low Complexity: 1 Low PT Treatments $Therapeutic Activity: 8-22 mins   PT G Codes:   PT G-Codes **NOT FOR INPATIENT CLASS** Functional Assessment Tool Used: AM-PAC 6 Clicks Basic Mobility Functional Limitation: Mobility: Walking and moving around Mobility: Walking and Moving Around Current Status (Z6606): At least 20 percent but less than 40 percent impaired, limited or restricted Mobility: Walking and Moving Around Goal Status [phone]): At least 20 percent but less than 40 percent impaired, limited or restricted Mobility: Walking and Moving Around Discharge Status [phone]): At least 20  percent but less than 40 percent impaired, limited or restricted    9:10 AM, 10/23/17 Lonell Grandchild, MPT Physical Therapist with [facility] 336 [phone] office [phone] mobile phone

## 2017-10-23 NOTE — BHH Counselor (Signed)
Pt is a 71 yo male who presented to [facility] on 10/20/17 under IVC (petitioned by daughter).  Per report, Pt stopped eating, has made comments about shooting himself, and is hearing voices.  Pt was reassessed this morning.  He expressed understanding that he is at the hospital for not eating and ''other stuff.''   Pt stated that he ate a little.  He also stated that he continues to feel depressed and "hear voices in my head."  Recommend continued inpatient.

## 2017-10-23 NOTE — Progress Notes (Signed)
Update on referrals for inpatient gero-psych treatment.   DECLINED Hospital doctor - due to limited criteria  [place] - at capacity (11/7)  PENDING [place]    [place] MSW, [place] Disposition [phone]

## 2017-10-23 NOTE — ED Notes (Signed)
Pt has eaten meal tray

## 2017-10-23 NOTE — ED Provider Notes (Addendum)
Patient is clinically deteriorating.  He seems weaker and is refusing to eat.  He has only had one bottle of Ensure.  He has been incontinent of stool and urine.  He is still complaining of some abdominal pain.  On exam, he is generally weak.  Lungs are clear, heart rate is regular rate and rhythm.  Abdomen is soft with mild tenderness in the suprapubic area.  There is no rebound or guarding.  He is awake and oriented.  He does have mild cogwheel rigidity suggesting a mild Parkinson's disease.  Will recheck his basic labs.  Also, he did not have a urinalysis checked on this hospital admission-we will check urinalysis.  Urinalysis shows clear evidence of urinary tract infection.  He is given a dose of ceftriaxone.  Urine has been sent for culture.  CBC is unremarkable.  Creatinine has improved slightly, but mild hyponatremia is now present.  We will give IV fluids.  Delora Fuel, MD 56/43/32 0310  Results for orders placed or performed during the hospital encounter of 10/20/17  Comprehensive metabolic panel  Result Value Ref Range   Sodium 138 135 - 145 mmol/L   Potassium 3.9 3.5 - 5.1 mmol/L   Chloride 100 (L) 101 - 111 mmol/L   CO2 25 22 - 32 mmol/L   Glucose, Bld 92 65 - 99 mg/dL   BUN 25 (H) 6 - 20 mg/dL   Creatinine, Ser 1.01 0.61 - 1.24 mg/dL   Calcium 9.4 8.9 - 10.3 mg/dL   Total Protein 7.1 6.5 - 8.1 g/dL   Albumin 3.9 3.5 - 5.0 g/dL   AST 30 15 - 41 U/L   ALT 36 17 - 63 U/L   Alkaline Phosphatase 70 38 - 126 U/L   Total Bilirubin 1.3 (H) 0.3 - 1.2 mg/dL   GFR calc non Af Amer >60 >60 mL/min   GFR calc Af Amer >60 >60 mL/min   Anion gap 13 5 - 15  Lipase, blood  Result Value Ref Range   Lipase 36 11 - 51 U/L  Ammonia  Result Value Ref Range   Ammonia 11 9 - 35 umol/L  CBC with Differential  Result Value Ref Range   WBC 8.7 4.0 - 10.5 K/uL   RBC 4.59 4.22 - 5.81 MIL/uL   Hemoglobin 14.7 13.0 - 17.0 g/dL   HCT 41.5 39.0 - 52.0 %   MCV 90.4 78.0 - 100.0 fL   MCH 32.0 26.0  - 34.0 pg   MCHC 35.4 30.0 - 36.0 g/dL   RDW 12.2 11.5 - 15.5 %   Platelets 225 150 - 400 K/uL   Neutrophils Relative % 78 %   Neutro Abs 6.7 1.7 - 7.7 K/uL   Lymphocytes Relative 12 %   Lymphs Abs 1.1 0.7 - 4.0 K/uL   Monocytes Relative 8 %   Monocytes Absolute 0.7 0.1 - 1.0 K/uL   Eosinophils Relative 2 %   Eosinophils Absolute 0.1 0.0 - 0.7 K/uL   Basophils Relative 0 %   Basophils Absolute 0.0 0.0 - 0.1 K/uL  Comprehensive metabolic panel  Result Value Ref Range   Sodium 131 (L) 135 - 145 mmol/L   Potassium 3.9 3.5 - 5.1 mmol/L   Chloride 100 (L) 101 - 111 mmol/L   CO2 21 (L) 22 - 32 mmol/L   Glucose, Bld 101 (H) 65 - 99 mg/dL   BUN 19 6 - 20 mg/dL   Creatinine, Ser 0.97 0.61 - 1.24 mg/dL   Calcium 8.7 (  L) 8.9 - 10.3 mg/dL   Total Protein 6.4 (L) 6.5 - 8.1 g/dL   Albumin 3.1 (L) 3.5 - 5.0 g/dL   AST 29 15 - 41 U/L   ALT 37 17 - 63 U/L   Alkaline Phosphatase 69 38 - 126 U/L   Total Bilirubin 1.0 0.3 - 1.2 mg/dL   GFR calc non Af Amer >60 >60 mL/min   GFR calc Af Amer >60 >60 mL/min   Anion gap 10 5 - 15  CBC with Differential  Result Value Ref Range   WBC 8.8 4.0 - 10.5 K/uL   RBC 4.41 4.22 - 5.81 MIL/uL   Hemoglobin 14.1 13.0 - 17.0 g/dL   HCT 39.9 39.0 - 52.0 %   MCV 90.5 78.0 - 100.0 fL   MCH 32.0 26.0 - 34.0 pg   MCHC 35.3 30.0 - 36.0 g/dL   RDW 12.3 11.5 - 15.5 %   Platelets 181 150 - 400 K/uL   Neutrophils Relative % 80 %   Neutro Abs 7.0 1.7 - 7.7 K/uL   Lymphocytes Relative 11 %   Lymphs Abs 1.0 0.7 - 4.0 K/uL   Monocytes Relative 7 %   Monocytes Absolute 0.7 0.1 - 1.0 K/uL   Eosinophils Relative 1 %   Eosinophils Absolute 0.1 0.0 - 0.7 K/uL   Basophils Relative 1 %   Basophils Absolute 0.1 0.0 - 0.1 K/uL  Urinalysis, Routine w reflex microscopic  Result Value Ref Range   Color, Urine AMBER (A) YELLOW   APPearance CLOUDY (A) CLEAR   Specific Gravity, Urine 1.019 1.005 - 1.030   pH 5.0 5.0 - 8.0   Glucose, UA NEGATIVE NEGATIVE mg/dL   Hgb urine  dipstick SMALL (A) NEGATIVE   Bilirubin Urine NEGATIVE NEGATIVE   Ketones, ur 5 (A) NEGATIVE mg/dL   Protein, ur 30 (A) NEGATIVE mg/dL   Nitrite NEGATIVE NEGATIVE   Leukocytes, UA LARGE (A) NEGATIVE   RBC / HPF 6-30 0 - 5 RBC/hpf   WBC, UA TOO NUMEROUS TO COUNT 0 - 5 WBC/hpf   Bacteria, UA FEW (A) NONE SEEN   Squamous Epithelial / LPF NONE SEEN NONE SEEN   WBC Clumps PRESENT    Mucus PRESENT   CK  Result Value Ref Range   Total CK 73 49 - 397 U/L  I-Stat CG4 Lactic Acid, ED  Result Value Ref Range   Lactic Acid, Venous 1.03 0.5 - 1.9 mmol/L    Delora Fuel, MD 40/08/67 [phone]

## 2017-10-23 NOTE — ED Notes (Signed)
Pt repositioned in bed.

## 2017-10-23 NOTE — ED Notes (Signed)
Pt given breakfast tray

## 2017-10-23 NOTE — ED Notes (Addendum)
PT came & walked with pt. Pt went to the bathroom & had a sm BM & perineal care. Pt seemed to do well ambulating with walker & assistance.

## 2017-10-23 NOTE — ED Notes (Signed)
Pt ate a little better at supper time

## 2017-10-23 NOTE — ED Notes (Signed)
Pt has sitter at bedside 

## 2017-10-24 ENCOUNTER — Telehealth: Payer: Self-pay | Admitting: Nurse Practitioner

## 2017-10-24 MED ORDER — ONDANSETRON 4 MG PO TBDP
4.0000 mg | ORAL_TABLET | Freq: Once | ORAL | Status: AC
  Administered 2017-10-24: 4 mg via ORAL
  Filled 2017-10-24: qty 1

## 2017-10-24 NOTE — ED Notes (Signed)
Pt ate about 25% of his breakfast & drank about 360 cc

## 2017-10-24 NOTE — ED Notes (Signed)
Pt urinated in urinal about 250 cc

## 2017-10-24 NOTE — BH Assessment (Addendum)
[place] Assessment Progress Note  Pt reassessed this morning. Pt denies SI, HI, AVH (says no hallucinations since at the hospital). Pt reports having a "sick stomach" and feeling "very weak". Pt admits that he hears the ED staff making fun of him.   Case staffed with Earleen Newport, NP, and IP treatment is still recommended with hopes that pt will be accepted to a facility today.   Kenna Gilbert. Lovena Le, [place], [place], LPCA Counselor

## 2017-10-24 NOTE — ED Notes (Signed)
Returned call to pts daughter [name]) At [phone].  [name] reports that pt is DNR, verified with Geraldine Solar, RN.

## 2017-10-24 NOTE — Progress Notes (Signed)
10/24/17 - Update on referrals for inpatient gero-psych treatment.   PENDING [facility] - reviewing  [facility] - reviewing  [facility] - no answer, unable to leave a voicemail.   DECLINED [facility] - per intake, patient is out of their "catchment area" [facility] - per intake, patient is not medically appropriate [facility] - at capacity (10/24/17) [facility] - at capacity (10/24/17) [facility] - per intake, due to limited criteria [facility] - at capacity (10/23/17)  TTS will continue to seek placement with the remaining "pending" facilities for possible placement.    Radonna Ricker MSW, [place] Disposition [phone]

## 2017-10-24 NOTE — Telephone Encounter (Signed)
Spoke with daughter about father. She is requesting that FL2 be faxed to several nursing facilities. Sent to requested facilities.

## 2017-10-24 NOTE — ED Notes (Signed)
TTS in progress 

## 2017-10-24 NOTE — ED Notes (Signed)
Gave pt a bed bath, brushed his teeth, & changed his bedsheets

## 2017-10-24 NOTE — ED Notes (Signed)
Patient had perineal care. Condom cath changed

## 2017-10-24 NOTE — ED Notes (Signed)
Pt c/o lower abdominal pain.  Pt jumps when abd palpated.  Bladder scan shows 105 ml.  Dr Lita Mains informed.

## 2017-10-25 ENCOUNTER — Encounter (HOSPITAL_COMMUNITY): Payer: Self-pay | Admitting: Registered Nurse

## 2017-10-25 LAB — URINE CULTURE: Culture: >=100000 — AB

## 2017-10-25 MED ORDER — AMPICILLIN SODIUM 1 G IJ SOLR
1.0000 g | Freq: Four times a day (QID) | INTRAMUSCULAR | Status: DC
  Administered 2017-10-25 – 2017-10-27 (×8): 1 g via INTRAVENOUS
  Filled 2017-10-25 (×13): qty 1000

## 2017-10-25 MED ORDER — AMPICILLIN SODIUM 1 G IJ SOLR: 1.0000 g | Freq: Three times a day (TID) | INTRAMUSCULAR | Status: DC

## 2017-10-25 MED ORDER — AMPICILLIN SODIUM 1 G IJ SOLR
1.0000 g | Freq: Four times a day (QID) | INTRAMUSCULAR | Status: DC
  Filled 2017-10-25: qty 1000

## 2017-10-25 NOTE — ED Notes (Signed)
Pt refusing meds at this time.  States he can't swallow at this time.  That he still has toothpaste in his mouth.  No visible toothpaste in mouth.  Pt lung sounds clear at this time.  Pt no distress.

## 2017-10-25 NOTE — ED Notes (Signed)
IVC paperwork filled out and faxed to [facility].

## 2017-10-25 NOTE — ED Notes (Signed)
When pt sleeps he become hypotensive- please note BP
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
## 2017-10-25 NOTE — ED Notes (Signed)
Call from Romie Minus, Social work [facility]  Has not been able to get in touch c pt's daughter, but is going to fax [name] assisted living as well as ECF list

## 2017-10-25 NOTE — ED Notes (Signed)
Pt rolled on to his back by Dodi, NT

## 2017-10-25 NOTE — ED Notes (Signed)
Per Romie Minus at [facility] is psychiatrically cleared. Romie Minus will call pt daughter.

## 2017-10-25 NOTE — ED Notes (Signed)
Pt turned to left side by Dodi, NT.

## 2017-10-25 NOTE — ED Notes (Signed)
telepsych assess

## 2017-10-25 NOTE — ED Notes (Signed)
Pt turned on to right side. By Dodi, NT.

## 2017-10-25 NOTE — ED Provider Notes (Addendum)
The patient was reexamined this morning, he has no significant complaints, he is still refusing to take oral medications, he has been getting 2 days in a row of Rocephin intramuscular, his lab work was reviewed and shows enterococcus urinary tract infection for which ampicillin has been suggested by the pharmacist.  We will switch over to this while pending placement.  On exam the patient has a soft nontender abdomen, he is not tachycardic, he is not interactive on the exam, he is awake and alert but does not have much in the way of communication.  He seems flat and withdrdawn.  Rocephin did not cover Enterococcus.  Ampicillin 1 g - QID X 5-7 transition to Amox when transferred. First dose ordered  Vitals:   10/24/17 2037 10/25/17 0514 10/25/17 0800 10/25/17 1027  BP: (!) 150/97 (!) 148/88 122/85 117/73  Pulse: 71 64 65 68  Resp: 20 17    Temp:  98.3 F (36.8 C)    TempSrc:  Oral    SpO2: 96% 99% 99% 98%  Weight:      Height:          Noemi Chapel, MD 10/25/17 1036    Noemi Chapel, MD 10/25/17 1038

## 2017-10-25 NOTE — ED Notes (Signed)
Call from Romie Minus, social worker [facility]  She has spoken w daughter who is "conflicted" as to whether or  Not she will pick up tomorrow or not as she will not pick up tonight and EDP Wilson Singer has agreed to board overnight -

## 2017-10-25 NOTE — ED Notes (Signed)
Awaiting dispo

## 2017-10-25 NOTE — Consult Note (Signed)
  Daniel Barnett, 71 y.o., male patient seen via telepsych by this provider on 10/25/17.  Chart reviewed and consulted with Dr. Dwyane Dee.  On evaluation Daniel Barnett reports that he is not having a good day related to his stomach.  Patient denies suicidal/homicidal ideation, psychosis, and paranoia.  Patient states that he has no prior psychiatric history and has never tried to commit suicide.  Patient states that he was living alone prior to coming to hospital but he was having issues with cooking, cleaning, shopping, taking medications, and some ADL's.  Patient is addiment that he is not suicidal.   During assessment patient alert/oriented, calm/cooperative.  Patient did not appear to be responding to internal/external stimuli.  Patient denied suicidal/homicidal ideation, psychosis, and paranoia    Patient psychiatrically cleared.  Recommendations:  Social worker to work assist family with placement in family care home or skilled nursing facility.    Azaylah Stailey B. Ernestine Langworthy, NP

## 2017-10-25 NOTE — ED Notes (Signed)
Introduced self to pt- discussed plan of care with him explaining why he needs ampicillin, how it has been shown to be successful with his urine culture after he states he cannot take anymore meds. He is alert, his HOB is elevated and lights on

## 2017-10-25 NOTE — Progress Notes (Signed)
Update on referrals for inpatient treatment.   DECLINED [facility] - per intake, pt will be a placement issue, believes inpatient treatment is not appropriate.    [facility] - per intake, at capacity [facility] - no answer, unable to leave a voicemail.  [facility] - per intake, patient is out of their "catchment area" [facility] - per intake, patient is not medically appropriate [facility] - at capacity (10/24/17) [facility] - at capacity (10/24/17) [facility] - per intake, due to limited criteria [facility] - at capacity (10/23/17)  The patient has been declined at all available gero-psych facilities.   Disposition CSW contacted the patient's daughter, [name] ([phone]) to notify her that the patient was declined at all facilities and that is recommended that the patient be referred to [facility] to be placed on their wait list.  CSW will continue follow.   Radonna Ricker MSW, [place] Disposition [phone]

## 2017-10-25 NOTE — ED Notes (Signed)
Patient is resting comfortably. 

## 2017-10-25 NOTE — ED Notes (Signed)
Meal provided 
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
## 2017-10-26 NOTE — ED Notes (Signed)
Attempted to feed pt, pt refused any more after one bite.

## 2017-10-26 NOTE — ED Notes (Signed)
Family at bedside. 
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
## 2017-10-26 NOTE — ED Notes (Signed)
Pt encouraged to eat. Pt was agreeable to eat piece of cake and drink cup of tea. Pt refused all other foods including [place] and gravy, stuffing, cranberry and roll.

## 2017-10-26 NOTE — ED Notes (Signed)
Pt encouraged to sit up in the recliner for awhile. Pt was able to stand and transfer to recliner with minimal assist.

## 2017-10-26 NOTE — ED Notes (Signed)
Pt encouraged to sit up in recliner for awhile. Pt able to stand and transfer to recliner with minimal assist.

## 2017-10-27 LAB — URINALYSIS, ROUTINE W REFLEX MICROSCOPIC
BILIRUBIN URINE: NEGATIVE
GLUCOSE, UA: NEGATIVE mg/dL
Hgb urine dipstick: NEGATIVE
KETONES UR: NEGATIVE mg/dL
Leukocytes, UA: NEGATIVE
Nitrite: NEGATIVE
PH: 6 (ref 5.0–8.0)
Protein, ur: NEGATIVE mg/dL
Specific Gravity, Urine: 1.016 (ref 1.005–1.030)

## 2017-10-27 LAB — COMPREHENSIVE METABOLIC PANEL
ALBUMIN: 3.3 g/dL — AB (ref 3.5–5.0)
ALT: 66 U/L — ABNORMAL HIGH (ref 17–63)
AST: 46 U/L — AB (ref 15–41)
Alkaline Phosphatase: 69 U/L (ref 38–126)
Anion gap: 7 (ref 5–15)
BUN: 12 mg/dL (ref 6–20)
CHLORIDE: 97 mmol/L — AB (ref 101–111)
CO2: 29 mmol/L (ref 22–32)
CREATININE: 0.8 mg/dL (ref 0.61–1.24)
Calcium: 9 mg/dL (ref 8.9–10.3)
GFR calc Af Amer: >60 mL/min (ref >60)
GFR calc non Af Amer: >60 mL/min (ref >60)
Glucose, Bld: 103 mg/dL — ABNORMAL HIGH (ref 65–99)
Potassium: 3.9 mmol/L (ref 3.5–5.1)
Sodium: 133 mmol/L — ABNORMAL LOW (ref 135–145)
Total Bilirubin: 1.3 mg/dL — ABNORMAL HIGH (ref 0.3–1.2)
Total Protein: 6.7 g/dL (ref 6.5–8.1)

## 2017-10-27 LAB — CBC WITH DIFFERENTIAL/PLATELET
BASOS ABS: 0.1 10*3/uL (ref 0.0–0.1)
BASOS PCT: 1 %
EOS ABS: 0.1 10*3/uL (ref 0.0–0.7)
EOS PCT: 2 %
HCT: 42.2 % (ref 39.0–52.0)
Hemoglobin: 15 g/dL (ref 13.0–17.0)
LYMPHS PCT: 19 %
Lymphs Abs: 1.4 10*3/uL (ref 0.7–4.0)
MCH: 32.4 pg (ref 26.0–34.0)
MCHC: 35.5 g/dL (ref 30.0–36.0)
MCV: 91.1 fL (ref 78.0–100.0)
MONO ABS: 0.7 10*3/uL (ref 0.1–1.0)
MONOS PCT: 10 %
NEUTROS ABS: 5 10*3/uL (ref 1.7–7.7)
Neutrophils Relative %: 68 %
PLATELETS: 208 10*3/uL (ref 150–400)
RBC: 4.63 MIL/uL (ref 4.22–5.81)
RDW: 12.6 % (ref 11.5–15.5)
WBC: 7.3 10*3/uL (ref 4.0–10.5)

## 2017-10-27 MED ORDER — BACITRACIN-NEOMYCIN-POLYMYXIN 400-5-5000 EX OINT
TOPICAL_OINTMENT | CUTANEOUS | Status: AC
  Filled 2017-10-27: qty 1

## 2017-10-27 NOTE — ED Provider Notes (Signed)
I was asked by nursing staff to reevaluate the patient for possible change of disposition.  Prior to evaluating the patient, I have done an extensive review of his chart and it appears that the patient initially came in with abdominal pain on November 1.  He was kept here as a social observation for a couple days and ultimately went home with his daughter.   He was then involuntarily committed by his daughter on November 4 and brought back here for concerns of not being able to take care of himself and possible psychosis along with abdominal pain.  Patient was here for couple days undergoing workup for these things and psychiatry was recommended inpatient treatment.  Patient was found to have a urinary tract infection during this time and a mild acute kidney injury but was not thought to be a candidate for admission to the hospital on the general medical floor.  He was started on antibiotics and was continued on antibiotics since that time.  2 days ago psychiatry had reevaluated that the patient was no longer a candidate for inpatient treatment.  They have recommended discharge from a psychiatry standpoint and that he is psychiatrically cleared.  On my evaluation the patient does still seem to be a little bit paranoid about people talking about him and not taking care of him, however is not overtly suicidal/homicidal or severely psychotic  He states that he has multiple complaints to include trouble swallowing however he has a tray on his bedside that has obviously been eaten from.  His abdomen is benign.  Skin turgor is good.  Mucous membranes are wet.  I have low suspicion for significant decreased intake.  He also complains of arm pain and chest pain, abdominal pain however once again he is been worked up multiple times to include labs and CT scans without a repeat change in abdominal exam I do not feel there is any utility and evaluate this again.  His CBC and CMP are both similar to previous without any acute  changes.  We will get repeat urinalysis to evaluate for clearance of the infection.  However this time I do not see an indication for admission to the hospital.  I think the patient needs to get a long-term facility placement and social work can continue working on that.  It appears that there is still pending multiple possibilities however I do not foresee this happening today.   Merrily Pew, MD 10/27/17 [phone]

## 2017-10-27 NOTE — BH Assessment (Addendum)
[place] Assessment Progress Note    Patient was seen for re-assessment.  He states that "they are trying to kill me in here."  When asked who, he states, "everybody."  When asked how, he states, "anyway they can." Patient admits that he is still depressed and states that he continues to hear voices.  Patient states that he is not eating or sleeping well.  Patient is in agreement that he needs to go to the hospital for help.  Will continue to seek placement for patient.

## 2017-10-27 NOTE — ED Notes (Signed)
Pt bathed, linens and diaper changed.

## 2017-10-28 MED ORDER — AMOXICILLIN 250 MG PO CAPS
500.0000 mg | ORAL_CAPSULE | Freq: Three times a day (TID) | ORAL | Status: DC
  Administered 2017-10-28 – 2017-10-30 (×5): 500 mg via ORAL
  Filled 2017-10-28 (×6): qty 2

## 2017-10-28 NOTE — Clinical Social Work Note (Addendum)
LCSW spoke with Daniel W. in disposition in regards to patient continuing to have paranoid hallucinations. Patient discussed that patient's ED nurse had called and reported that patient remained in the ED and that it was reported by other nursing staff that he continued to have paranoid hallucinations. (Patient's note from [facility] counselor, Daniel Barnett from 10/27/17, was reviewed and it detailed patient's continued active paranoid hallucinations).  Was advised that patient would need to have another TTS  Consult placed.  LCSW spoke with patient's ED nurse and requested that another TTS consult be placed for patient due to his continued reports of paranoid hallucinations.    Daniel Barnett, Clydene Pugh, LCSW

## 2017-10-28 NOTE — ED Notes (Signed)
When asked pt if he is still hearing voices he states "yes," but denies hearing any today. Denies SI/HI

## 2017-10-28 NOTE — ED Provider Notes (Signed)
Patient has lost IV access and is refusing to have another IV started.  He is currently receiving IV ampicillin for enterococcus urinary tract infection.  This will be switched to p.o. Amoxicillin. Treatment should be complete on 11/14.  Repeat UA yesterday showed clearance of infection.   Ezequiel Essex, MD 10/28/17 [phone]

## 2017-10-28 NOTE — Progress Notes (Signed)
CSW received request information that [facility] ED is requesting that patient be assessed again.  [facility]ED requested that Dr. Dwyane Dee, Medical Director at [facility] assess patient.  Patient was psych cleared on 10/25/17.  CSW contacted Dr. Dwyane Dee, per request.  Dr. Dwyane Dee explained that she is the Medical Director for [facility] and does not conduct assessments.  Dr. Dwyane Dee asks that TTS complete assessment consulting with the on-duty psychiatric physician extender to determine current status.  CSW advised TTS staff.  Areatha Keas. Judi Cong, MSW, [place] Disposition Clinical Social Work [phone] (cell) [phone] (office)

## 2017-10-28 NOTE — ED Notes (Signed)
Pt walking outside of his room. RN went to help pt back in room. When RN asked what he needed he said, " they are going to hurt me." Asked pt who and he kept saying they are in my room. Asked pt if he knew where he was and he said "yes in the hosptial". Told pt no one was in his room. Helped pt back into bed and reiterated to him that no one was trying to hurt him and that I didn't see anyone in his room.

## 2017-10-28 NOTE — ED Notes (Signed)
Spoke with Daniel Barnett at [facility] about pt TTS consult. Informed that she was going to talk to Dr. Dwyane Dee about pt and would let us know.unknown timeframe per Romie Minus.

## 2017-10-28 NOTE — ED Notes (Signed)
Pt states he wants to wait to take his meds. Informed pt that he will need to take them soon. Pt agreed.

## 2017-10-28 NOTE — BH Assessment (Signed)
[place] Assessment Progress Note  PT IS PSYCHIATRICALLY CLEARED. TTS IS NO LONGER SEARCHING FOR PSYCHIATRIC PLACEMENT. PT IS TO BE FOLLOWED BY SOCIAL WORK TO ASSIST WITH OUT OF HOME PLACEMENT.  Kenna Gilbert. Lovena Le, [place], [place], LPCA Counselor

## 2017-10-28 NOTE — ED Provider Notes (Signed)
Sleeping comfortably   Orlie Dakin, MD 10/28/17 [phone]

## 2017-10-28 NOTE — Clinical Social Work Note (Signed)
Patient's daughter, Ms. Abbott, detailed that once she is able to get a rental car she will come back to this area to try to assist patient if he is psych cleared again. She reports that she spoke with him this weekend and that he was paranoid and experiencing audio hallucinations.  She stated that he hallucinations were so loud in his head that he told her he could barely hear her talking to him.  She reported that patient's paranoia and hallucinations started within the past few months when he was initially diagnosed with a UTI. She stated that despite treatment of the UTI he has continued to have episodes of paranoia and hallucinations frequently.  She stated that the neighbors that came to visit with patient are substance abusers and are saying that patient can come live with them because they need his check.  Ms. Donnajean Lopes stated that she does not know why patient continues to be cleared when he remains paranoid and experiencing hallucinations.     Nyasiah Moffet, Clydene Pugh, LCSW

## 2017-10-28 NOTE — ED Notes (Addendum)
RN verified with Aldona Bar at [facility] that patient is psychiatrically cleared. RN called SW Nira Conn [phone] for consult.

## 2017-10-28 NOTE — ED Notes (Signed)
Pt turned with pillow under left hip. Pt still refuses to take PO meds at this time.

## 2017-10-28 NOTE — ED Notes (Signed)
Pt agreed to take a few medications was only able to give antibiotics and BP meds. Pt states "just leave me alone so I can die."pt states just tell them who are out there to come get me. When asked who he says those people outside my door. Informed pt there was no one there.

## 2017-10-29 ENCOUNTER — Emergency Department (HOSPITAL_COMMUNITY): Payer: Medicare HMO

## 2017-10-29 DIAGNOSIS — Z87891 Personal history of nicotine dependence: Secondary | ICD-10-CM

## 2017-10-29 DIAGNOSIS — I2699 Other pulmonary embolism without acute cor pulmonale: Secondary | ICD-10-CM

## 2017-10-29 DIAGNOSIS — F332 Major depressive disorder, recurrent severe without psychotic features: Secondary | ICD-10-CM

## 2017-10-29 DIAGNOSIS — R05 Cough: Secondary | ICD-10-CM | POA: Diagnosis not present

## 2017-10-29 DIAGNOSIS — R440 Auditory hallucinations: Secondary | ICD-10-CM

## 2017-10-29 LAB — CBC WITH DIFFERENTIAL/PLATELET
Basophils Absolute: 0.1 10*3/uL (ref 0.0–0.1)
Basophils Relative: 1 %
EOS ABS: 0.2 10*3/uL (ref 0.0–0.7)
EOS PCT: 2 %
HCT: 42.5 % (ref 39.0–52.0)
Hemoglobin: 14.9 g/dL (ref 13.0–17.0)
LYMPHS ABS: 1.6 10*3/uL (ref 0.7–4.0)
Lymphocytes Relative: 21 %
MCH: 32.3 pg (ref 26.0–34.0)
MCHC: 35.1 g/dL (ref 30.0–36.0)
MCV: 92.2 fL (ref 78.0–100.0)
Monocytes Absolute: 0.6 10*3/uL (ref 0.1–1.0)
Monocytes Relative: 7 %
Neutro Abs: 5.2 10*3/uL (ref 1.7–7.7)
Neutrophils Relative %: 69 %
PLATELETS: 232 10*3/uL (ref 150–400)
RBC: 4.61 MIL/uL (ref 4.22–5.81)
RDW: 12.8 % (ref 11.5–15.5)
WBC: 7.7 10*3/uL (ref 4.0–10.5)

## 2017-10-29 LAB — BASIC METABOLIC PANEL
Anion gap: 7 (ref 5–15)
BUN: 13 mg/dL (ref 6–20)
CHLORIDE: 96 mmol/L — AB (ref 101–111)
CO2: 29 mmol/L (ref 22–32)
CREATININE: 0.89 mg/dL (ref 0.61–1.24)
Calcium: 9.2 mg/dL (ref 8.9–10.3)
GFR calc Af Amer: >60 mL/min (ref >60)
GFR calc non Af Amer: >60 mL/min (ref >60)
Glucose, Bld: 81 mg/dL (ref 65–99)
Potassium: 4.2 mmol/L (ref 3.5–5.1)
SODIUM: 132 mmol/L — AB (ref 135–145)

## 2017-10-29 LAB — URINE CULTURE: CULTURE: NO GROWTH

## 2017-10-29 LAB — D-DIMER, QUANTITATIVE (NOT AT ARMC): D DIMER QUANT: 2.49 ug{FEU}/mL — AB (ref 0.00–0.50)

## 2017-10-29 MED ORDER — RIVAROXABAN 15 MG PO TABS
15.0000 mg | ORAL_TABLET | Freq: Two times a day (BID) | ORAL | Status: DC
  Administered 2017-10-29 – 2017-10-30 (×2): 15 mg via ORAL
  Filled 2017-10-29 (×7): qty 1

## 2017-10-29 MED ORDER — GUAIFENESIN ER 600 MG PO TB12
600.0000 mg | ORAL_TABLET | Freq: Two times a day (BID) | ORAL | Status: DC
  Administered 2017-10-29 – 2017-10-30 (×2): 600 mg via ORAL
  Filled 2017-10-29 (×7): qty 1

## 2017-10-29 MED ORDER — IOPAMIDOL (ISOVUE-370) INJECTION 76%
100.0000 mL | Freq: Once | INTRAVENOUS | Status: AC | PRN
  Administered 2017-10-29: 100 mL via INTRAVENOUS

## 2017-10-29 MED ORDER — IPRATROPIUM-ALBUTEROL 0.5-2.5 (3) MG/3ML IN SOLN
3.0000 mL | Freq: Once | RESPIRATORY_TRACT | Status: AC
  Administered 2017-10-29: 3 mL via RESPIRATORY_TRACT
  Filled 2017-10-29: qty 3

## 2017-10-29 NOTE — ED Notes (Signed)
IVC papers faxed over to [facility] and called.

## 2017-10-29 NOTE — ED Notes (Signed)
Pt alert and talking with nurse. States he hears people talking about him, they are saying things about [employer]. States these voices are wanting to hurt him. He allowed this nurse to do vitals but refused any medication.

## 2017-10-29 NOTE — Clinical Social Work Note (Signed)
LCSW spoke with Patriciaann Clan at [facility] DSS/APS in regards to patient's case/history.  Morey Hummingbird stated that she planned on coming to assess patient today in the ED.     Alaira Level, Clydene Pugh, LCSW

## 2017-10-29 NOTE — Progress Notes (Signed)
Disposition CSW contacted [facility] ED Nurse, Daniel Needle, RN to confirm that patient is being admitted to [place] unit due to the pulmonary embolism discovered in his lung upon x-ray today.  Per ED Nurse, the hope is that he will only be admitted overnight and might still be able to be transferred to [facility] in [place] tomorrow.    Patient has been IVC'd, as was planned prior to the discovery of his medical condition.  CSW contacted [facility] Admissions/Intake and spoke with Daniel Barnett.  CSW explained the patient's situation and asked if bed could be held.  Daniel Barnett agreed to hold pt's bed overnight but asked that they be updated in the morning as to patient's status.  It is not likely bed will be held if patient is not improved enough to transfer tomorrow.  CSW left voice mail for [facility] CSW, Daniel Barnett. To update her on status.  This Probation officer will ask First shift Disposition CSW to follow-up in the AM with Daniel Barnett and [employer]Marland Kitchen  Daniel Barnett. Daniel Barnett, MSW, [facility] Disposition Clinical Social Work [phone] (cell) [phone] (office)

## 2017-10-29 NOTE — Consult Note (Signed)
[facility] Consult note    Patient Demographics:    Daniel Barnett, is a 71 y.o. male  MRN: [id number]  DOB - [birth year]  Admit Date - 10/20/2017  Referring MD/NP/PA: Dr Lita Mains     Chief Complaint  Patient presents with  . V70.1      HPI:    Daniel Barnett  is a 71 y.o. 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  Patient at that time developed mild acute kidney injury and UTI.  He was treated with antibiotics.  Patient has already been accepted at [facility] in [place].   Today patient complained of cough, CTA chest was done which showed single pulmonary embolus in the right lower lobe medially.  No evidence of right heart strain noted.  Patient is not hypoxic.  O2 sats 98% on room air   He denies shortness of breath Complains of coughing, phlegm. He denies chest pain He denies smoking cigarettes No history of emphysema/COPD   Review of systems:      All other systems reviewed and are negative.   With Past History of the following :    Past Medical History:  Diagnosis Date  . Anxiety   . Gout   . Hyperlipidemia   . Hypertension   . Psoriasis       Past Surgical History:  Procedure Laterality Date  . TONSILLECTOMY    . VASECTOMY        Social History:      Social History   Tobacco Use  . Smoking status: Former Smoker    Types: Cigarettes  . Smokeless tobacco: Never Used  . Tobacco comment: pt states he quit approx 3 months ago (stated on 10/20/17)  Substance Use Topics  . Alcohol use: No    Frequency: Never    Comment: pt states he quit three months ago (stated on 10/20/17)       Family History :     Family History  Problem Relation Age of Onset  . Colon cancer Neg Hx   . Colon polyps Neg Hx       Home Medications:   Prior to Admission medications   Medication  Sig Start Date End Date Taking? Authorizing Provider  allopurinol (ZYLOPRIM) 100 MG tablet Take 1 tablet (100 mg total) by mouth daily. 12/25/16  Yes Hassell Done, Mary-Margaret, FNP  ALPRAZolam Duanne Moron) 0.25 MG tablet TAKE 1 TABLET AT BEDTIME  FOR  ANXIETY 09/19/17  Yes Hawks, Christy A, FNP  cyanocobalamin 500 MCG tablet Take 1 tablet (500 mcg total) by mouth daily. 09/27/17  Yes Tat, Shanon Brow, MD  folic acid (FOLVITE) 622 MCG tablet Take 1 tablet (400 mcg total) by mouth daily. 08/29/17 08/29/18 Yes Reyne Dumas, MD  lisinopril (PRINIVIL,ZESTRIL) 20 MG tablet Take 1 tablet (20 mg total) by mouth daily. 08/28/17 08/28/18 Yes Reyne Dumas, MD  magnesium oxide (MAG-OX) 400 (241.3 Mg) MG tablet Take 1 tablet (400 mg total) by mouth daily. 08/28/17  Yes Reyne Dumas, MD  metoprolol succinate (TOPROL-XL) 50 MG 24 hr tablet TAKE 1 TABLET DAILY WITH OR IMMEDIATELY FOLLOWING A MEAL 05/22/17  Yes Chipper Herb, MD  pantoprazole (PROTONIX) 40 MG tablet Take 1 tablet (40 mg total) by mouth daily. 08/28/17  Yes Reyne Dumas, MD  polyethylene glycol (MIRALAX / GLYCOLAX) packet Take 17 g by mouth daily. 10/18/17  Yes Long, Wonda Olds, MD  pravastatin (PRAVACHOL) 20 MG tablet TAKE 1 TABLET EVERY DAY 05/22/17  Yes Chipper Herb, MD  thiamine 100 MG tablet Take 1 tablet (100 mg total) by mouth daily. 08/29/17  Yes Reyne Dumas, MD     Allergies:     Allergies  Allergen Reactions  . Ciprofloxacin      Physical Exam:   Vitals  Blood pressure (!) 142/93, pulse 62, temperature 98.2 F (36.8 C), temperature source Oral, resp. rate 18, height 6\' 1"  (1.854 m), weight 95.3 kg (210 lb), SpO2 98 %.  1.  General: Appears in no acute distress  2. Psychiatric:  awake alert, oriented x 3.  3. Neurologic: No focal neurological deficits, all cranial nerves intact.Strength 5/5 all 4 extremities, sensation intact all 4 extremities, plantars down going.  4. Eyes :  anicteric sclerae, moist conjunctivae with no lid lag.  PERRLA.  5. ENMT:  Oropharynx clear with moist mucous membranes and good dentition  6. Neck:  supple, no cervical lymphadenopathy appriciated, No thyromegaly  7. Respiratory : Normal respiratory effort, good air movement bilaterally,clear to  auscultation bilaterally  8. Cardiovascular : RRR, no gallops, rubs or murmurs, no leg edema  9. Gastrointestinal:  Positive bowel sounds, abdomen soft, non-tender to palpation,no hepatosplenomegaly, no rigidity or guarding       10. Skin:  No cyanosis, normal texture and turgor, no rash, lesions or ulcers  11.Musculoskeletal:  Good muscle tone,  joints appear normal , no effusions,  normal range of motion    Data Review:    CBC Recent Labs  Lab 10/23/17 0237 10/27/17 1334 10/29/17 1614  WBC 8.8 7.3 7.7  HGB 14.1 15.0 14.9  HCT 39.9 42.2 42.5  PLT 181 208 232  MCV 90.5 91.1 92.2  MCH 32.0 32.4 32.3  MCHC 35.3 35.5 35.1  RDW 12.3 12.6 12.8  LYMPHSABS 1.0 1.4 1.6  MONOABS 0.7 0.7 0.6  EOSABS 0.1 0.1 0.2  BASOSABS 0.1 0.1 0.1   ------------------------------------------------------------------------------------------------------------------  Chemistries  Recent Labs  Lab 10/23/17 0237 10/27/17 1334 10/29/17 1056  NA 131* 133* 132*  K 3.9 3.9 4.2  CL 100* 97* 96*  CO2 21* 29 29  GLUCOSE 101* 103* 81  BUN 19 12 13   CREATININE 0.97 0.80 0.89  CALCIUM 8.7* 9.0 9.2  AST 29 46*  --   ALT 37 66*  --   ALKPHOS 69 69  --   BILITOT 1.0 1.3*  --    ------------------------------------------------------------------------------------------------------------------  ------------------------------------------------------------------------------------------------------------------ GFR: Estimated Creatinine Clearance: 87.3 mL/min (by C-G formula based on SCr of 0.89 mg/dL). Liver Function Tests: Recent Labs  Lab 10/23/17 0237 10/27/17 1334  AST 29 46*  ALT 37 66*  ALKPHOS 69 69  BILITOT 1.0 1.3*  PROT 6.4* 6.7  ALBUMIN  3.1* 3.3*   No results for input(s): LIPASE, AMYLASE in the last 168 hours. No results for input(s): AMMONIA in the last 168 hours. Coagulation Profile: No results for input(s): INR, PROTIME in the last 168 hours. Cardiac Enzymes: Recent Labs  Lab 10/23/17 0237  CKTOTAL 73    --------------------------------------------------------------------------------------------------------------- Urine analysis:    Component  Value Date/Time   COLORURINE YELLOW 10/27/2017 [place] 10/27/2017 1633   LABSPEC 1.016 10/27/2017 1633   PHURINE 6.0 10/27/2017 1633   GLUCOSEU NEGATIVE 10/27/2017 1633   HGBUR NEGATIVE 10/27/2017 1633   BILIRUBINUR NEGATIVE 10/27/2017 1633   KETONESUR NEGATIVE 10/27/2017 1633   PROTEINUR NEGATIVE 10/27/2017 1633   NITRITE NEGATIVE 10/27/2017 1633   LEUKOCYTESUR NEGATIVE 10/27/2017 1633      Imaging Results:    Ct Angio Chest Pe W And/or Wo Contrast  Result Date: 10/29/2017 CLINICAL DATA:  New onset cough EXAM: CT ANGIOGRAPHY CHEST WITH CONTRAST TECHNIQUE: Multidetector CT imaging of the chest was performed using the standard protocol during bolus administration of intravenous contrast. Multiplanar CT image reconstructions and MIPs were obtained to evaluate the vascular anatomy. CONTRAST:  186mL ISOVUE-370 IOPAMIDOL (ISOVUE-370) INJECTION 76% COMPARISON:  None. FINDINGS: Cardiovascular: The thoracic aorta shows mild dilatation of the ascending portion to 3.9 cm. Mild atherosclerotic calcifications are noted. No evidence of dissection is seen. No cardiac enlargement is noted. Heavy coronary calcifications are seen. Pulmonary artery is well visualized and demonstrates a normal branching pattern. In the medial aspect of the right lower lobe there is evidence of a single filling defect consistent with pulmonary embolus. No other pulmonary emboli are identified. No evidence of right heart strain is noted. Mediastinum/Nodes: The thoracic inlet is within  normal limits. No significant hilar or mediastinal adenopathy is seen. The esophagus is within normal limits. Lungs/Pleura: Lungs are well aerated bilaterally. Very minimal emphysematous changes are seen. Mild apical scarring is noted bilaterally. No sizable infiltrate or effusion is seen. The left lower lobe bronchial tree demonstrates some soft tissue material likely related to mucous. Upper Abdomen: Visualized upper abdomen is within normal limits. Musculoskeletal: Degenerative changes of the thoracic spine are noted. No compression deformities are seen. Review of the MIP images confirms the above findings. IMPRESSION: Single pulmonary embolus in the right lower lobe medially. No evidence of right heart strain is noted. 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 Mucous within the left lower lobe bronchial tree. Aortic Atherosclerosis (ICD10-I70.0) and Emphysema (ICD10-J43.9). Electronically Signed   By: Inez Catalina M.D.   On: 10/29/2017 15:43   Dg Chest Portable 1 View  Result Date: 10/29/2017 CLINICAL DATA:  Cough, hypertension, prior smoker EXAM: PORTABLE CHEST 1 VIEW COMPARISON:  10/20/2017 FINDINGS: Heart is normal size. No confluent airspace opacities or effusions. No acute bony abnormality. IMPRESSION: No active disease. Electronically Signed   By: Rolm Baptise M.D.   On: 10/29/2017 10:08    My personal review of EKG: Rhythm NSR   Assessment & Plan:   Pulmonary embolism  1. Pulmonary embolism-patient has single pulmonary embolus in the right lower lobe, no right heart strain.  O2 sats 98% on room air.  We will start patient on Xarelto per pharmacy.  He needs to be on anticoagulation at least for 6 months.  He can follow-up with his PCP after discharge from [place].  Patient can be transferred to [facility] in a.m. Patient can stay in the ED.   Oswald Hillock M.D on 10/29/2017 at 7:07 PM  Between 7am to 7pm - Pager - [phone]. After 7pm go to [email or web address] -  password [facility]  [facility] - Office  [phone]

## 2017-10-29 NOTE — ED Notes (Signed)
Pt given heart healthy meal tray. Pt refused tray and stated that he is too sick to eat.

## 2017-10-29 NOTE — Consult Note (Signed)
Telepsych Consultation   Reason for Consult: Psychosis Referring Physician: Dr. Tomi Bamberger, MD Location of Patient: [facility] Location of Provider: [facility]  Patient Identification: Daniel Barnett MRN:  [id number] Principal Diagnosis: <principal problem not specified> Diagnosis:   Patient Active Problem List   Diagnosis Date Noted  . Delirium [R41.0] 09/26/2017  . Auditory hallucination [R44.0] 09/26/2017  . Elevated troponin [R74.8] 09/26/2017  . Encephalopathy [G93.40] 09/25/2017  . Abdominal pain [R10.9]   . Hyponatremia [E87.1]   . 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 CN (constipation) [K59.00] 04/18/2015  . Insomnia [G47.00] 04/02/2014  . BMI 30.0-30.9,adult [Z68.30] 04/02/2014  . Essential hypertension, benign [I10] 04/13/2013  . Hyperlipidemia with target LDL less than 100 [E78.5] 04/13/2013    Total Time spent with patient: 30 minutes  Subjective:   Daniel Barnett is a 71 y.o. 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  HPI: Per the TTS assessment completed on 10/21/17 by Christel Dews: Daniel Barnett is an 71 y.o. male who was IVC'd by his daughter, [name] because she says that he is hearing voices, is seeing things that are not there, has problems with depression, and says he wants to give up and at times has talked about killing himself. The patient denies any suicidal or homicidal ideations. He has no plan for either of these activities.  During assessment, pt reports feelings of depression due to his stomach pains but adamantly states "I do not want to hurt myself or anyone else."  Pt denies A/V hallucinations during assessment.  According to pt's records, patient does acknowledge that he hears voices and he does acknowledge that he sees things that are not there. Pt reports not eating because his stomach bothers him but denies wanting to hurt himself.  Pt is able to contract for safety  Pt currently lives alone  since his caregiver stop working with him 2 weeks ago.  According to pt's records, Patient was not recommended for SNF. Patient may be in need of long term ALF. Patient's daughter will need to make arrangements to either stay with patient/locate another care giver or take him home with her to seek placement in her area  Patient was wearing hospital gown and appeared  appropriately groomed.  Pt was alert throughout the assessment.  Patient made fair eye contact and had normal psychomotor activity.  Patient spoke in a normal voice without pressured speech.  Pt expressed feeling depressed due to his current circumstances  Pt's affect appeared normal  and congruent with stated mood. Pt's thought process was coherent and logical.  Pt presented with good insight and judgement.  Pt did not appear to be responding to internal stimuli.  Previous psych consult completed by Earleen Newport, NP on 10/25/17: Daniel Barnett, 71 y.o., male patient seen via telepsych by this provider on 10/25/17.  Chart reviewed and consulted with Dr. Dwyane Dee.  On evaluation Daniel Barnett reports that he is not having a good day related to his stomach.  Patient denies suicidal/homicidal ideation, psychosis, and paranoia.  Patient states that he has no prior psychiatric history and has never tried to commit suicide.  Patient states that he was living alone prior to coming to hospital but he was having issues with cooking, cleaning, shopping, taking medications, and some ADL's.  Patient is addiment that he is not suicidal.   During assessment patient alert/oriented, calm/cooperative.  Patient did not appear to be responding to internal/external stimuli.  Patient denied suicidal/homicidal  ideation, psychosis, and paranoia   FYI: Information from Patient's neighbors as documented by Whitney Post, RN: Pt's neighbors [name] and his mother [name] in to see pt, this nurse asked pt if he wanted visitors he initially said no, when  told the pt who the visitors were he stated he would like to see them, after visit visitors came to this nurse stating they are concerned about the pt and where he will go, they advised they have been the pt's neighbor for 35 yrs and they have assisted with taking him to the grocery store, dr appt and running errands, they further stated the pt's dtr lives in [facility] and she has emptied his bank acct, changed the locks on to his house and taken the keys with her, they further state she is planning to sell the pt's home and asked them not to answer any calls from [employer] or the hospital bc if EMS can't get him in the house then the hospital will have to find a nursing home placement for the pt, neighbors state they are concerned about pt welfare and are willing for him to live with them and assume responsibility for caring for him. This nurse advised I am unable to disclose any information regarding the pt but can have social work contact them tomorrow. Neighbors are [name]: [phone] and [name] [phone]   Past Psychiatric History: As in H&P  Risk to Self: Suicidal Ideation: No Suicidal Intent: No Is patient at risk for suicide?: No Suicidal Plan?: No Access to Means: No What has been your use of drugs/alcohol within the last 12 months?: Pt last drinked alcohol 2 months ago Triggers for Past Attempts: Other (Comment) Intentional Self Injurious Behavior: None Risk to Others: Homicidal Ideation: No Thoughts of Harm to Others: No Current Homicidal Intent: No Current Homicidal Plan: No Access to Homicidal Means: No History of harm to others?: No Assessment of Violence: None Noted Does patient have access to weapons?: No Criminal Charges Pending?: No Does patient have a court date: Yes(IVC hearing date unknown) Court Date: (unknown by patient) Prior Inpatient Therapy: Prior Inpatient Therapy: No Prior Outpatient Therapy: Prior Outpatient Therapy: Yes Prior Therapy Dates:  unknown Prior Therapy Facilty/Provider(s): Pt reports his therapist's name is Engineer, civil (consulting) Reason for Treatment: Depression Does patient have an ACCT team?: No Does patient have Intensive In-House Services?  : No Does patient have [facility] services? : No Does patient have P4CC services?: No  Past Medical History:  Past Medical History:  Diagnosis Date  . Anxiety   . Gout   . Hyperlipidemia   . Hypertension   . Psoriasis     Past Surgical History:  Procedure Laterality Date  . TONSILLECTOMY    . VASECTOMY     Family History:  Family History  Problem Relation Age of Onset  . Colon cancer Neg Hx   . Colon polyps Neg Hx    Family Psychiatric  History:  Unknown  Social History:  Social History   Substance and Sexual Activity  Alcohol Use No  . Frequency: Never   Comment: pt states he quit three months ago (stated on 10/20/17)     Social History   Substance and Sexual Activity  Drug Use No    Social History   Socioeconomic History  . Marital status: Divorced    Spouse name: None  . Number of children: None  . Years of education: None  . Highest education level: None  Social Needs  .  Financial resource strain: None  . Food insecurity - worry: None  . Food insecurity - inability: None  . Transportation needs - medical: None  . Transportation needs - non-medical: None  Occupational History  . None  Tobacco Use  . Smoking status: Former Smoker    Types: Cigarettes  . Smokeless tobacco: Never Used  . Tobacco comment: pt states he quit approx 3 months ago (stated on 10/20/17)  Substance and Sexual Activity  . Alcohol use: No    Frequency: Never    Comment: pt states he quit three months ago (stated on 10/20/17)  . Drug use: No  . Sexual activity: None  Other Topics Concern  . None  Social History Narrative  . None   Additional Social History:    Allergies:   Allergies  Allergen Reactions  . Ciprofloxacin     Labs:  Results for orders placed or  performed during the hospital encounter of 10/20/17 (from the past 48 hour(s))  CBC with Differential     Status: None   Collection Time: 10/27/17  1:34 PM  Result Value Ref Range   WBC 7.3 4.0 - 10.5 K/uL   RBC 4.63 4.22 - 5.81 MIL/uL   Hemoglobin 15.0 13.0 - 17.0 g/dL   HCT 42.2 39.0 - 52.0 %   MCV 91.1 78.0 - 100.0 fL   MCH 32.4 26.0 - 34.0 pg   MCHC 35.5 30.0 - 36.0 g/dL   RDW 12.6 11.5 - 15.5 %   Platelets 208 150 - 400 K/uL   Neutrophils Relative % 68 %   Neutro Abs 5.0 1.7 - 7.7 K/uL   Lymphocytes Relative 19 %   Lymphs Abs 1.4 0.7 - 4.0 K/uL   Monocytes Relative 10 %   Monocytes Absolute 0.7 0.1 - 1.0 K/uL   Eosinophils Relative 2 %   Eosinophils Absolute 0.1 0.0 - 0.7 K/uL   Basophils Relative 1 %   Basophils Absolute 0.1 0.0 - 0.1 K/uL  Comprehensive metabolic panel     Status: Abnormal   Collection Time: 10/27/17  1:34 PM  Result Value Ref Range   Sodium 133 (L) 135 - 145 mmol/L   Potassium 3.9 3.5 - 5.1 mmol/L   Chloride 97 (L) 101 - 111 mmol/L   CO2 29 22 - 32 mmol/L   Glucose, Bld 103 (H) 65 - 99 mg/dL   BUN 12 6 - 20 mg/dL   Creatinine, Ser 0.80 0.61 - 1.24 mg/dL   Calcium 9.0 8.9 - 10.3 mg/dL   Total Protein 6.7 6.5 - 8.1 g/dL   Albumin 3.3 (L) 3.5 - 5.0 g/dL   AST 46 (H) 15 - 41 U/L   ALT 66 (H) 17 - 63 U/L   Alkaline Phosphatase 69 38 - 126 U/L   Total Bilirubin 1.3 (H) 0.3 - 1.2 mg/dL   GFR calc non Af Amer >60 >60 mL/min   GFR calc Af Amer >60 >60 mL/min    Comment: (NOTE) The eGFR has been calculated using the CKD EPI equation. This calculation has not been validated in all clinical situations. eGFR's persistently <60 mL/min signify possible Chronic Kidney Disease.    Anion gap 7 5 - 15  Urinalysis, Routine w reflex microscopic     Status: None   Collection Time: 10/27/17  4:33 PM  Result Value Ref Range   Color, Urine YELLOW YELLOW   APPearance CLEAR CLEAR   Specific Gravity, Urine 1.016 1.005 - 1.030   pH 6.0 5.0 - 8.0  Glucose, UA  NEGATIVE NEGATIVE mg/dL   Hgb urine dipstick NEGATIVE NEGATIVE   Bilirubin Urine NEGATIVE NEGATIVE   Ketones, ur NEGATIVE NEGATIVE mg/dL   Protein, ur NEGATIVE NEGATIVE mg/dL   Nitrite NEGATIVE NEGATIVE   Leukocytes, UA NEGATIVE NEGATIVE  Urine culture     Status: None   Collection Time: 10/27/17  4:33 PM  Result Value Ref Range   Specimen Description URINE, CATHETERIZED    Special Requests NONE    Culture      NO GROWTH Performed at [facility], [place] [address] [place], [place] [address]    Report Status 10/29/2017 FINAL     Medications:  Current Facility-Administered Medications  Medication Dose Route Frequency Provider Last Rate Last Dose  . allopurinol (ZYLOPRIM) tablet 100 mg  100 mg Oral Daily Noemi Chapel, MD   100 mg at 10/28/17 1542  . ALPRAZolam Duanne Moron) tablet 0.25 mg  0.25 mg Oral QHS PRN Noemi Chapel, MD   0.25 mg at 10/28/17 2151  . amoxicillin (AMOXIL) capsule 500 mg  500 mg Oral Q8H Rancour, Stephen, MD   500 mg at 10/28/17 2150  . folic acid (FOLVITE) tablet 0.5 mg  500 mcg Oral Daily Noemi Chapel, MD   0.5 mg at 10/27/17 1025  . lisinopril (PRINIVIL,ZESTRIL) tablet 20 mg  20 mg Oral Daily Noemi Chapel, MD   20 mg at 10/27/17 1024  . magnesium oxide (MAG-OX) tablet 400 mg  400 mg Oral Daily Noemi Chapel, MD   400 mg at 10/27/17 1026  . metoprolol succinate (TOPROL-XL) 24 hr tablet 50 mg  50 mg Oral Daily Noemi Chapel, MD   50 mg at 10/28/17 1542  . pantoprazole (PROTONIX) EC tablet 40 mg  40 mg Oral Daily Noemi Chapel, MD   40 mg at 10/27/17 1025  . polyethylene glycol (MIRALAX / GLYCOLAX) packet 17 g  17 g Oral Daily Noemi Chapel, MD   17 g at 10/27/17 1026  . pravastatin (PRAVACHOL) tablet 20 mg  20 mg Oral q1800 Noemi Chapel, MD   20 mg at 10/28/17 2150  . senna-docusate (Senokot-S) tablet 1 tablet  1 tablet Oral Daily Noemi Chapel, MD   1 tablet at 10/27/17 1026  . thiamine tablet 100 mg  100 mg Oral Daily Noemi Chapel, MD   100 mg at 10/27/17  1024  . vitamin B-12 (CYANOCOBALAMIN) tablet 500 mcg  500 mcg Oral Daily Noemi Chapel, MD   500 mcg at 10/27/17 1024   Current Outpatient Medications  Medication Sig Dispense Refill  . allopurinol (ZYLOPRIM) 100 MG tablet Take 1 tablet (100 mg total) by mouth daily. 90 tablet 1  . ALPRAZolam (XANAX) 0.25 MG tablet TAKE 1 TABLET AT BEDTIME  FOR  ANXIETY 90 tablet 1  . cyanocobalamin 500 MCG tablet Take 1 tablet (500 mcg total) by mouth daily. 30 tablet   . folic acid (FOLVITE) 932 MCG tablet Take 1 tablet (400 mcg total) by mouth daily. 100 tablet 3  . lisinopril (PRINIVIL,ZESTRIL) 20 MG tablet Take 1 tablet (20 mg total) by mouth daily. 30 tablet 11  . magnesium oxide (MAG-OX) 400 (241.3 Mg) MG tablet Take 1 tablet (400 mg total) by mouth daily. 30 tablet 1  . metoprolol succinate (TOPROL-XL) 50 MG 24 hr tablet TAKE 1 TABLET DAILY WITH OR IMMEDIATELY FOLLOWING A MEAL 90 tablet 1  . pantoprazole (PROTONIX) 40 MG tablet Take 1 tablet (40 mg total) by mouth daily. 30 tablet 1  . polyethylene glycol (MIRALAX /  GLYCOLAX) packet Take 17 g by mouth daily. 14 each 0  . pravastatin (PRAVACHOL) 20 MG tablet TAKE 1 TABLET EVERY DAY 90 tablet 1  . thiamine 100 MG tablet Take 1 tablet (100 mg total) by mouth daily. 30 tablet 2    Musculoskeletal: UTA via camera  Psychiatric Specialty Exam: Physical Exam  Nursing note and vitals reviewed.   Review of Systems  Psychiatric/Behavioral: Negative for hallucinations, substance abuse and suicidal ideas. The patient is not nervous/anxious.     Blood pressure 128/81, pulse 60, temperature 98.7 F (37.1 C), temperature source Oral, resp. rate 18, height '6\' 1"'  (1.854 m), weight 95.3 kg (210 lb), SpO2 98 %.Body mass index is 27.71 kg/m.  General Appearance: on hospital scrub  Eye Contact:  Good  Speech:  Clear and Coherent and Slow  Volume:  Normal  Mood:  labile  Affect:  Appropriate and Congruent  Thought Process:  Coherent and Goal Directed   Orientation:  Full (Time, Place, and Person)  Thought Content:  WDL and Logical  Suicidal Thoughts:  No  Homicidal Thoughts:  No  Memory:  Immediate;   Good Recent;   Good Remote;   Fair  Judgement:  Good  Insight:  Present  Psychomotor Activity:  Normal  Concentration:  Concentration: Good and Attention Span: Fair  Recall:  Good  Fund of Knowledge:  Good  Language:  Good  Akathisia:  Negative  Handed:  Right  AIMS (if indicated):     Assets:  Communication Skills Desire for Improvement Financial Resources/Insurance Housing Leisure Time  ADL's:  Intact  Cognition:  WNL  Sleep:        Treatment Plan Summary: Plan to seek inpatient treatment while a longterm placement is sought Although patient denies SI/HI, he continues to display paranoia per nurses documentation, based on that, he will benefit from inpatient hospitalization at this time. SW to follow up with Adult Protective Services in regards to the report by patient's neigbhors as document by Whitney Post, RN on 10/26/2017     Disposition: Recommend psychiatric Inpatient admission when medically cleared.   This service was provided via telemedicine using a 2-way, interactive audio and video technology.  Names of all persons participating in this telemedicine service and their role in this encounter. Name: Daniel Barnett. [place] Role: Patient  Name: Daniel Barnett A. Kobi Aller  Role: NP           Vicenta Aly, NP 10/29/2017 9:54 AM

## 2017-10-29 NOTE — ED Notes (Signed)
Set pt up and did deep breathing exercises. Pt able to cough productively, thick white tinged mucus. Pt still sitting up in bed with sitter at door.

## 2017-10-29 NOTE — Progress Notes (Signed)
Accepted to [facility] for inpatient gero-psych treatment.    Accepting/attending physician is Dr. Fabian Sharp Patient is going to Room 9448-2 Number for report is [phone] Patient can transport at anytime, the bed is ready.   [facility] is requesting that the patient's IVC paperwork be faxed to [phone].    Theola Sequin, RN notified   Radonna Ricker MSW, [place] Disposition [phone]

## 2017-10-29 NOTE — Progress Notes (Signed)
ANTICOAGULATION CONSULT NOTE - Initial Consult  Pharmacy Consult for xarelto Indication: pulmonary embolus  Allergies  Allergen Reactions  . Ciprofloxacin     Patient Measurements: Height: 6\' 1"  (185.4 cm) Weight: 210 lb (95.3 kg) IBW/kg (Calculated) : 79.9   Vital Signs: Temp: 98.2 F (36.8 C) (11/13 1519) Temp Source: Oral (11/13 1519) BP: 142/93 (11/13 1746) Pulse Rate: 62 (11/13 1746)  Labs: Recent Labs    10/27/17 1334 10/29/17 1056 10/29/17 1614  HGB 15.0  --  14.9  HCT 42.2  --  42.5  PLT 208  --  232  CREATININE 0.80 0.89  --     Estimated Creatinine Clearance: 87.3 mL/min (by C-G formula based on SCr of 0.89 mg/dL).   Medical History: Past Medical History:  Diagnosis Date  . Anxiety   . Gout   . Hyperlipidemia   . Hypertension   . Psoriasis     Medications:  See medication history  Assessment: 71 yo man to start xarelto for PE Goal of Therapy:  Therapeutic anticoagulation Monitor platelets by anticoagulation protocol: Yes   Plan:  Xarelto 15 mg po bid X 21 days then 20 mg po daily Monitor for bleeding complications  Daniel Barnett 10/29/2017,7:25 PM

## 2017-10-29 NOTE — ED Notes (Signed)
Pt rounded on upon my shift assessment. Pt stated, "They are trying to hurt me out there. Don't you hear them? They are shooting at [name]. If they don't kill him, I will." Patient asked who he was speaking of and pt stated, "Shanon Brow".

## 2017-10-29 NOTE — ED Notes (Signed)
Repositioned pt in bed

## 2017-10-29 NOTE — ED Notes (Signed)
Pt has notable cough that was not previously documented. EDP made aware. Repeat xray

## 2017-10-29 NOTE — ED Notes (Signed)
Pt refused to take antibiotics. Pt states, "I can't right now, just leave me alone." Attempted to take pt vital signs, pt jerked arm away and states, "Leave me alone and cut the light out."

## 2017-10-29 NOTE — Progress Notes (Signed)
CSW received call from [facility] ED regarding pt's status.  It has been decided that patient WILL NOT be admitted to the hospital but would remain in the ED until he is transferred in the morning (10/30/17).. Patient has been prescribed xarelto (15 mg po bid X 21 days then 20 mg po daily)  CSW contacted [facility] and advised of new status.  They asked that patient be transferred with at least 7 days worth of the xarelto (Xarelto 15 mg po bid)  and a prescription for the remaining 14 days (15 mg po bid) and then 20 mg po daily after 21 days, or that patient be transferred with all 21 days worth of the (15 mg po bid)  prescription that is being recommended initially.  CSW contacted [facility] Nurse and relayed this request.  CSW will ask daytime Disposition CSW, Jolan W., LCSWA to follow up in the AM to assure transfer arrangements are in place.  Areatha Keas. Judi Cong, MSW, [place] Disposition Clinical Social Work [phone] (cell) [phone] (office)

## 2017-10-30 DIAGNOSIS — I959 Hypotension, unspecified: Secondary | ICD-10-CM | POA: Diagnosis not present

## 2017-10-30 DIAGNOSIS — F0281 Dementia in other diseases classified elsewhere with behavioral disturbance: Secondary | ICD-10-CM | POA: Diagnosis not present

## 2017-10-30 DIAGNOSIS — R1084 Generalized abdominal pain: Secondary | ICD-10-CM | POA: Diagnosis not present

## 2017-10-30 DIAGNOSIS — F1027 Alcohol dependence with alcohol-induced persisting dementia: Secondary | ICD-10-CM | POA: Diagnosis not present

## 2017-10-30 DIAGNOSIS — R1310 Dysphagia, unspecified: Secondary | ICD-10-CM | POA: Diagnosis not present

## 2017-10-30 DIAGNOSIS — E871 Hypo-osmolality and hyponatremia: Secondary | ICD-10-CM | POA: Diagnosis not present

## 2017-10-30 DIAGNOSIS — I10 Essential (primary) hypertension: Secondary | ICD-10-CM | POA: Diagnosis not present

## 2017-10-30 DIAGNOSIS — F0280 Dementia in other diseases classified elsewhere without behavioral disturbance: Secondary | ICD-10-CM | POA: Diagnosis not present

## 2017-10-30 DIAGNOSIS — R339 Retention of urine, unspecified: Secondary | ICD-10-CM | POA: Diagnosis not present

## 2017-10-30 DIAGNOSIS — R45851 Suicidal ideations: Secondary | ICD-10-CM | POA: Diagnosis not present

## 2017-10-30 DIAGNOSIS — N3000 Acute cystitis without hematuria: Secondary | ICD-10-CM | POA: Diagnosis not present

## 2017-10-30 DIAGNOSIS — G301 Alzheimer's disease with late onset: Secondary | ICD-10-CM | POA: Diagnosis not present

## 2017-10-30 DIAGNOSIS — Z87891 Personal history of nicotine dependence: Secondary | ICD-10-CM | POA: Diagnosis not present

## 2017-10-30 DIAGNOSIS — I059 Rheumatic mitral valve disease, unspecified: Secondary | ICD-10-CM | POA: Diagnosis not present

## 2017-10-30 DIAGNOSIS — K5901 Slow transit constipation: Secondary | ICD-10-CM | POA: Diagnosis not present

## 2017-10-30 DIAGNOSIS — E876 Hypokalemia: Secondary | ICD-10-CM | POA: Diagnosis not present

## 2017-10-30 DIAGNOSIS — K5900 Constipation, unspecified: Secondary | ICD-10-CM | POA: Diagnosis not present

## 2017-10-30 DIAGNOSIS — I2510 Atherosclerotic heart disease of native coronary artery without angina pectoris: Secondary | ICD-10-CM | POA: Diagnosis not present

## 2017-10-30 DIAGNOSIS — R109 Unspecified abdominal pain: Secondary | ICD-10-CM | POA: Diagnosis not present

## 2017-10-30 DIAGNOSIS — E46 Unspecified protein-calorie malnutrition: Secondary | ICD-10-CM | POA: Diagnosis not present

## 2017-10-30 DIAGNOSIS — G319 Degenerative disease of nervous system, unspecified: Secondary | ICD-10-CM | POA: Diagnosis not present

## 2017-10-30 DIAGNOSIS — I2699 Other pulmonary embolism without acute cor pulmonale: Secondary | ICD-10-CM | POA: Diagnosis not present

## 2017-10-30 DIAGNOSIS — R079 Chest pain, unspecified: Secondary | ICD-10-CM | POA: Diagnosis not present

## 2017-10-30 DIAGNOSIS — R1032 Left lower quadrant pain: Secondary | ICD-10-CM | POA: Diagnosis not present

## 2017-10-30 DIAGNOSIS — N323 Diverticulum of bladder: Secondary | ICD-10-CM | POA: Diagnosis not present

## 2017-10-30 DIAGNOSIS — I519 Heart disease, unspecified: Secondary | ICD-10-CM | POA: Diagnosis not present

## 2017-10-30 DIAGNOSIS — F419 Anxiety disorder, unspecified: Secondary | ICD-10-CM | POA: Diagnosis not present

## 2017-10-30 DIAGNOSIS — R440 Auditory hallucinations: Secondary | ICD-10-CM | POA: Diagnosis not present

## 2017-10-30 DIAGNOSIS — R05 Cough: Secondary | ICD-10-CM | POA: Diagnosis not present

## 2017-10-30 DIAGNOSIS — E43 Unspecified severe protein-calorie malnutrition: Secondary | ICD-10-CM | POA: Diagnosis not present

## 2017-10-30 DIAGNOSIS — F329 Major depressive disorder, single episode, unspecified: Secondary | ICD-10-CM | POA: Diagnosis not present

## 2017-10-30 DIAGNOSIS — I700 Atherosclerosis of aorta: Secondary | ICD-10-CM | POA: Diagnosis not present

## 2017-10-30 DIAGNOSIS — N400 Enlarged prostate without lower urinary tract symptoms: Secondary | ICD-10-CM | POA: Diagnosis not present

## 2017-10-30 DIAGNOSIS — Z79899 Other long term (current) drug therapy: Secondary | ICD-10-CM | POA: Diagnosis not present

## 2017-10-30 DIAGNOSIS — F333 Major depressive disorder, recurrent, severe with psychotic symptoms: Secondary | ICD-10-CM | POA: Diagnosis not present

## 2017-10-30 DIAGNOSIS — G309 Alzheimer's disease, unspecified: Secondary | ICD-10-CM | POA: Diagnosis not present

## 2017-10-30 DIAGNOSIS — M1A09X0 Idiopathic chronic gout, multiple sites, without tophus (tophi): Secondary | ICD-10-CM | POA: Diagnosis not present

## 2017-10-30 LAB — I-STAT CG4 LACTIC ACID, ED: LACTIC ACID, VENOUS: 1.73 mmol/L (ref 0.5–1.9)

## 2017-10-30 NOTE — Progress Notes (Signed)
Accepted to [facility].   Accepting/attending physician is Dr. Fabian Sharp Patient is going to Room 9448-2 Number for report is [phone] Patient can transport at anytime, the bed is ready.   [place] [facility] is requesting that the patient's IVC paperwork be faxed to [phone].   Anderson Malta, RN notified and agreed Theola Sequin, RN notified. [employer] CSW, [place], LCSW notified.    Radonna Ricker MSW, [place] Disposition [phone]

## 2017-10-30 NOTE — ED Notes (Signed)
Called [facility] regarding pt coming to their facility. Was told that [facility], admission Coordinator said they could not take report until Carlyon Shadow is told my our physician, pt is cleared.  Report was given to [facility] last night but our Therapist, sports.  Pt has a bed available at the facility.  Called [facility]utter's office and was told they are waiting to here from [facility] and when they did, they would call me back with updates on his bed.

## 2017-10-30 NOTE — ED Provider Notes (Signed)
Patient was found to have isolated PE on CT scan Seen by medicine Their consult note reviewed/appreciated, they do not feel he needs medical admission, he can take xarelto Per social work and psych notes, he is to be transferred to [facility] on 11/14 Pt currently sleeping. His BP has drifted down, but he is afebrile and negative lactate.  His lisinopril was STOPPED No hypoxia or distress noted He started taking xarelto He will need Rx for xarelto prior to transfer to [facility] on 11/14    Ripley Fraise, MD 10/30/17 0147

## 2017-10-30 NOTE — ED Provider Notes (Signed)
Pt accepted to [facility], apparently no need for rx, as ED RN was told they have their own pharmacy. Will transfer stable.    Francine Graven, DO 10/30/17 1052

## 2017-10-30 NOTE — ED Notes (Signed)
RN got pt up to chair for breakfast, walked well with walker.

## 2017-10-30 NOTE — ED Notes (Signed)
Gave report to [facility] RN regarding pt coming to facility.  I mentioned sending the packet and prescription of Xarelto with pt.  [facility] states that they are a medical inpatient facility and get their medication through their pharmacy and do not need me to send the packet or prescription of Xarelto.  [facility] states they will administer him the medication. MD notified and aware.

## 2017-10-30 NOTE — ED Notes (Signed)
Patient ambulated with 2 assist and walker to the restroom and back to room. Patient is sitting up in a recliner. Patient was bed bathed new gown, sheets, socks, and sacral patch placed on patient at this time.

## 2017-10-30 NOTE — ED Notes (Signed)
Radonna Ricker, CSW from [facility], called and reported pt has been accepted to [facility] and bed is ready. Mikel Cella requesting a copy of IVC paperwork be faxed over to [phone] prior to pt leaving [facility].

## 2017-10-31 ENCOUNTER — Ambulatory Visit: Payer: Medicare HMO | Admitting: Gastroenterology

## 2017-10-31 ENCOUNTER — Telehealth: Payer: Self-pay | Admitting: Gastroenterology

## 2017-10-31 ENCOUNTER — Encounter: Payer: Self-pay | Admitting: Gastroenterology

## 2017-10-31 NOTE — Telephone Encounter (Signed)
PATIENT WAS A NO SHOW AND LETTER SENT  °

## 2017-11-01 NOTE — Telephone Encounter (Signed)
Patient currently inpatient at [facility] per [facility]. Please NIC for OV in four weeks.

## 2017-11-04 ENCOUNTER — Encounter: Payer: Self-pay | Admitting: Gastroenterology

## 2017-11-04 NOTE — Telephone Encounter (Signed)
MADE PATIENT APPOINTMENT FOR OFFICE VISIT

## 2017-11-05 DIAGNOSIS — G309 Alzheimer's disease, unspecified: Secondary | ICD-10-CM | POA: Diagnosis not present

## 2017-11-05 DIAGNOSIS — I6529 Occlusion and stenosis of unspecified carotid artery: Secondary | ICD-10-CM | POA: Diagnosis not present

## 2017-11-05 DIAGNOSIS — M79605 Pain in left leg: Secondary | ICD-10-CM | POA: Diagnosis not present

## 2017-11-05 DIAGNOSIS — R339 Retention of urine, unspecified: Secondary | ICD-10-CM | POA: Diagnosis not present

## 2017-11-05 DIAGNOSIS — G319 Degenerative disease of nervous system, unspecified: Secondary | ICD-10-CM | POA: Diagnosis not present

## 2017-11-05 DIAGNOSIS — K5900 Constipation, unspecified: Secondary | ICD-10-CM | POA: Diagnosis not present

## 2017-11-05 DIAGNOSIS — E871 Hypo-osmolality and hyponatremia: Secondary | ICD-10-CM | POA: Diagnosis not present

## 2017-11-05 DIAGNOSIS — E43 Unspecified severe protein-calorie malnutrition: Secondary | ICD-10-CM | POA: Diagnosis not present

## 2017-11-05 DIAGNOSIS — N3000 Acute cystitis without hematuria: Secondary | ICD-10-CM | POA: Diagnosis not present

## 2017-11-05 DIAGNOSIS — N400 Enlarged prostate without lower urinary tract symptoms: Secondary | ICD-10-CM | POA: Diagnosis not present

## 2017-11-05 DIAGNOSIS — R188 Other ascites: Secondary | ICD-10-CM | POA: Diagnosis not present

## 2017-11-05 DIAGNOSIS — N1330 Unspecified hydronephrosis: Secondary | ICD-10-CM | POA: Diagnosis not present

## 2017-11-05 DIAGNOSIS — I10 Essential (primary) hypertension: Secondary | ICD-10-CM | POA: Diagnosis not present

## 2017-11-05 DIAGNOSIS — R05 Cough: Secondary | ICD-10-CM | POA: Diagnosis not present

## 2017-11-05 DIAGNOSIS — F0281 Dementia in other diseases classified elsewhere with behavioral disturbance: Secondary | ICD-10-CM | POA: Diagnosis not present

## 2017-11-05 DIAGNOSIS — F333 Major depressive disorder, recurrent, severe with psychotic symptoms: Secondary | ICD-10-CM | POA: Diagnosis not present

## 2017-11-05 DIAGNOSIS — I2699 Other pulmonary embolism without acute cor pulmonale: Secondary | ICD-10-CM | POA: Diagnosis not present

## 2017-11-05 DIAGNOSIS — M79604 Pain in right leg: Secondary | ICD-10-CM | POA: Diagnosis not present

## 2017-11-05 DIAGNOSIS — I959 Hypotension, unspecified: Secondary | ICD-10-CM | POA: Diagnosis not present

## 2017-11-05 DIAGNOSIS — G301 Alzheimer's disease with late onset: Secondary | ICD-10-CM | POA: Diagnosis not present

## 2017-11-05 DIAGNOSIS — R1032 Left lower quadrant pain: Secondary | ICD-10-CM | POA: Diagnosis not present

## 2017-11-05 DIAGNOSIS — E876 Hypokalemia: Secondary | ICD-10-CM | POA: Diagnosis not present

## 2017-11-05 DIAGNOSIS — M1A09X0 Idiopathic chronic gout, multiple sites, without tophus (tophi): Secondary | ICD-10-CM | POA: Diagnosis not present

## 2017-11-05 DIAGNOSIS — G3183 Dementia with Lewy bodies: Secondary | ICD-10-CM | POA: Diagnosis not present

## 2017-11-05 DIAGNOSIS — R29818 Other symptoms and signs involving the nervous system: Secondary | ICD-10-CM | POA: Diagnosis not present

## 2017-11-05 DIAGNOSIS — R1310 Dysphagia, unspecified: Secondary | ICD-10-CM | POA: Diagnosis not present

## 2017-11-25 DIAGNOSIS — F0281 Dementia in other diseases classified elsewhere with behavioral disturbance: Secondary | ICD-10-CM | POA: Diagnosis not present

## 2017-11-25 DIAGNOSIS — E785 Hyperlipidemia, unspecified: Secondary | ICD-10-CM | POA: Diagnosis not present

## 2017-11-25 DIAGNOSIS — R1312 Dysphagia, oropharyngeal phase: Secondary | ICD-10-CM | POA: Diagnosis not present

## 2017-11-25 DIAGNOSIS — R05 Cough: Secondary | ICD-10-CM | POA: Diagnosis not present

## 2017-11-25 DIAGNOSIS — Z0289 Encounter for other administrative examinations: Secondary | ICD-10-CM | POA: Diagnosis not present

## 2017-11-25 DIAGNOSIS — N400 Enlarged prostate without lower urinary tract symptoms: Secondary | ICD-10-CM | POA: Diagnosis not present

## 2017-11-25 DIAGNOSIS — I2699 Other pulmonary embolism without acute cor pulmonale: Secondary | ICD-10-CM | POA: Diagnosis not present

## 2017-11-25 DIAGNOSIS — R633 Feeding difficulties: Secondary | ICD-10-CM | POA: Diagnosis not present

## 2017-11-25 DIAGNOSIS — F329 Major depressive disorder, single episode, unspecified: Secondary | ICD-10-CM | POA: Diagnosis not present

## 2017-11-25 DIAGNOSIS — Z7409 Other reduced mobility: Secondary | ICD-10-CM | POA: Diagnosis not present

## 2017-11-25 DIAGNOSIS — G3184 Mild cognitive impairment, so stated: Secondary | ICD-10-CM | POA: Diagnosis not present

## 2017-11-25 DIAGNOSIS — L409 Psoriasis, unspecified: Secondary | ICD-10-CM | POA: Diagnosis not present

## 2017-11-25 DIAGNOSIS — R45851 Suicidal ideations: Secondary | ICD-10-CM | POA: Diagnosis not present

## 2017-11-25 DIAGNOSIS — M1A09X0 Idiopathic chronic gout, multiple sites, without tophus (tophi): Secondary | ICD-10-CM | POA: Diagnosis not present

## 2017-11-25 DIAGNOSIS — M1A00X0 Idiopathic chronic gout, unspecified site, without tophus (tophi): Secondary | ICD-10-CM | POA: Diagnosis not present

## 2017-11-25 DIAGNOSIS — I10 Essential (primary) hypertension: Secondary | ICD-10-CM | POA: Diagnosis not present

## 2017-11-25 DIAGNOSIS — M6281 Muscle weakness (generalized): Secondary | ICD-10-CM | POA: Diagnosis not present

## 2017-11-25 DIAGNOSIS — F333 Major depressive disorder, recurrent, severe with psychotic symptoms: Secondary | ICD-10-CM | POA: Diagnosis not present

## 2017-11-25 DIAGNOSIS — E559 Vitamin D deficiency, unspecified: Secondary | ICD-10-CM | POA: Diagnosis not present

## 2017-11-25 DIAGNOSIS — G301 Alzheimer's disease with late onset: Secondary | ICD-10-CM | POA: Diagnosis not present

## 2017-11-25 DIAGNOSIS — F322 Major depressive disorder, single episode, severe without psychotic features: Secondary | ICD-10-CM | POA: Diagnosis not present

## 2017-11-25 DIAGNOSIS — G309 Alzheimer's disease, unspecified: Secondary | ICD-10-CM | POA: Diagnosis not present

## 2017-11-25 DIAGNOSIS — F419 Anxiety disorder, unspecified: Secondary | ICD-10-CM | POA: Diagnosis not present

## 2017-11-25 DIAGNOSIS — R2681 Unsteadiness on feet: Secondary | ICD-10-CM | POA: Diagnosis not present

## 2017-11-25 DIAGNOSIS — E871 Hypo-osmolality and hyponatremia: Secondary | ICD-10-CM | POA: Diagnosis not present

## 2017-11-25 DIAGNOSIS — R269 Unspecified abnormalities of gait and mobility: Secondary | ICD-10-CM | POA: Diagnosis not present

## 2017-11-25 DIAGNOSIS — R1310 Dysphagia, unspecified: Secondary | ICD-10-CM | POA: Diagnosis not present

## 2017-12-25 ENCOUNTER — Telehealth: Payer: Self-pay | Admitting: Gastroenterology

## 2017-12-25 ENCOUNTER — Encounter: Payer: Self-pay | Admitting: Gastroenterology

## 2017-12-25 ENCOUNTER — Ambulatory Visit: Payer: Medicare HMO | Admitting: Gastroenterology

## 2017-12-25 NOTE — Telephone Encounter (Signed)
PATIENT WAS A NO SHOW AND LETTER SENT  °

## 2017-12-26 DIAGNOSIS — R918 Other nonspecific abnormal finding of lung field: Secondary | ICD-10-CM | POA: Diagnosis not present

## 2017-12-26 DIAGNOSIS — R1312 Dysphagia, oropharyngeal phase: Secondary | ICD-10-CM | POA: Diagnosis not present

## 2017-12-26 DIAGNOSIS — Z515 Encounter for palliative care: Secondary | ICD-10-CM | POA: Diagnosis not present

## 2017-12-26 DIAGNOSIS — E559 Vitamin D deficiency, unspecified: Secondary | ICD-10-CM | POA: Diagnosis not present

## 2017-12-26 DIAGNOSIS — R079 Chest pain, unspecified: Secondary | ICD-10-CM | POA: Diagnosis not present

## 2017-12-26 DIAGNOSIS — E870 Hyperosmolality and hypernatremia: Secondary | ICD-10-CM | POA: Diagnosis not present

## 2017-12-26 DIAGNOSIS — R1310 Dysphagia, unspecified: Secondary | ICD-10-CM | POA: Diagnosis not present

## 2017-12-26 DIAGNOSIS — M1A09X0 Idiopathic chronic gout, multiple sites, without tophus (tophi): Secondary | ICD-10-CM | POA: Diagnosis not present

## 2017-12-26 DIAGNOSIS — N390 Urinary tract infection, site not specified: Secondary | ICD-10-CM | POA: Diagnosis not present

## 2017-12-26 DIAGNOSIS — Z7409 Other reduced mobility: Secondary | ICD-10-CM | POA: Diagnosis not present

## 2017-12-26 DIAGNOSIS — M6281 Muscle weakness (generalized): Secondary | ICD-10-CM | POA: Diagnosis not present

## 2017-12-26 DIAGNOSIS — F322 Major depressive disorder, single episode, severe without psychotic features: Secondary | ICD-10-CM | POA: Diagnosis not present

## 2017-12-26 DIAGNOSIS — F333 Major depressive disorder, recurrent, severe with psychotic symptoms: Secondary | ICD-10-CM | POA: Diagnosis not present

## 2017-12-26 DIAGNOSIS — R0602 Shortness of breath: Secondary | ICD-10-CM | POA: Diagnosis not present

## 2017-12-26 DIAGNOSIS — Z66 Do not resuscitate: Secondary | ICD-10-CM | POA: Diagnosis not present

## 2017-12-26 DIAGNOSIS — G3184 Mild cognitive impairment, so stated: Secondary | ICD-10-CM | POA: Diagnosis not present

## 2017-12-26 DIAGNOSIS — G4700 Insomnia, unspecified: Secondary | ICD-10-CM | POA: Diagnosis not present

## 2017-12-26 DIAGNOSIS — R269 Unspecified abnormalities of gait and mobility: Secondary | ICD-10-CM | POA: Diagnosis not present

## 2017-12-26 DIAGNOSIS — I2782 Chronic pulmonary embolism: Secondary | ICD-10-CM | POA: Diagnosis not present

## 2017-12-26 DIAGNOSIS — E46 Unspecified protein-calorie malnutrition: Secondary | ICD-10-CM | POA: Diagnosis not present

## 2017-12-26 DIAGNOSIS — N400 Enlarged prostate without lower urinary tract symptoms: Secondary | ICD-10-CM | POA: Diagnosis not present

## 2017-12-26 DIAGNOSIS — F329 Major depressive disorder, single episode, unspecified: Secondary | ICD-10-CM | POA: Diagnosis not present

## 2017-12-26 DIAGNOSIS — R2681 Unsteadiness on feet: Secondary | ICD-10-CM | POA: Diagnosis not present

## 2017-12-26 DIAGNOSIS — R0600 Dyspnea, unspecified: Secondary | ICD-10-CM | POA: Diagnosis not present

## 2017-12-26 DIAGNOSIS — Z87891 Personal history of nicotine dependence: Secondary | ICD-10-CM | POA: Diagnosis not present

## 2017-12-26 DIAGNOSIS — R0902 Hypoxemia: Secondary | ICD-10-CM | POA: Diagnosis not present

## 2017-12-26 DIAGNOSIS — R05 Cough: Secondary | ICD-10-CM | POA: Diagnosis not present

## 2017-12-26 DIAGNOSIS — J449 Chronic obstructive pulmonary disease, unspecified: Secondary | ICD-10-CM | POA: Diagnosis not present

## 2017-12-26 DIAGNOSIS — J168 Pneumonia due to other specified infectious organisms: Secondary | ICD-10-CM | POA: Diagnosis not present

## 2017-12-26 DIAGNOSIS — J984 Other disorders of lung: Secondary | ICD-10-CM | POA: Diagnosis not present

## 2017-12-26 DIAGNOSIS — J189 Pneumonia, unspecified organism: Secondary | ICD-10-CM | POA: Diagnosis not present

## 2017-12-26 DIAGNOSIS — D518 Other vitamin B12 deficiency anemias: Secondary | ICD-10-CM | POA: Diagnosis not present

## 2017-12-26 DIAGNOSIS — R159 Full incontinence of feces: Secondary | ICD-10-CM | POA: Diagnosis not present

## 2017-12-26 DIAGNOSIS — J690 Pneumonitis due to inhalation of food and vomit: Secondary | ICD-10-CM | POA: Diagnosis not present

## 2017-12-26 DIAGNOSIS — I209 Angina pectoris, unspecified: Secondary | ICD-10-CM | POA: Diagnosis not present

## 2017-12-26 DIAGNOSIS — J9601 Acute respiratory failure with hypoxia: Secondary | ICD-10-CM | POA: Diagnosis not present

## 2017-12-27 DIAGNOSIS — N400 Enlarged prostate without lower urinary tract symptoms: Secondary | ICD-10-CM | POA: Diagnosis not present

## 2017-12-27 DIAGNOSIS — R1310 Dysphagia, unspecified: Secondary | ICD-10-CM | POA: Diagnosis not present

## 2017-12-27 DIAGNOSIS — D518 Other vitamin B12 deficiency anemias: Secondary | ICD-10-CM | POA: Diagnosis not present

## 2017-12-27 DIAGNOSIS — I209 Angina pectoris, unspecified: Secondary | ICD-10-CM | POA: Diagnosis not present

## 2017-12-27 DIAGNOSIS — F333 Major depressive disorder, recurrent, severe with psychotic symptoms: Secondary | ICD-10-CM | POA: Diagnosis not present

## 2017-12-27 DIAGNOSIS — M6281 Muscle weakness (generalized): Secondary | ICD-10-CM | POA: Diagnosis not present

## 2017-12-27 DIAGNOSIS — G4700 Insomnia, unspecified: Secondary | ICD-10-CM | POA: Diagnosis not present

## 2017-12-27 DIAGNOSIS — I2782 Chronic pulmonary embolism: Secondary | ICD-10-CM | POA: Diagnosis not present

## 2017-12-27 DIAGNOSIS — J449 Chronic obstructive pulmonary disease, unspecified: Secondary | ICD-10-CM | POA: Diagnosis not present

## 2017-12-30 DIAGNOSIS — J449 Chronic obstructive pulmonary disease, unspecified: Secondary | ICD-10-CM | POA: Diagnosis not present

## 2017-12-30 DIAGNOSIS — I2782 Chronic pulmonary embolism: Secondary | ICD-10-CM | POA: Diagnosis not present

## 2017-12-30 DIAGNOSIS — R1310 Dysphagia, unspecified: Secondary | ICD-10-CM | POA: Diagnosis not present

## 2017-12-30 DIAGNOSIS — F333 Major depressive disorder, recurrent, severe with psychotic symptoms: Secondary | ICD-10-CM | POA: Diagnosis not present

## 2017-12-30 DIAGNOSIS — G4700 Insomnia, unspecified: Secondary | ICD-10-CM | POA: Diagnosis not present

## 2017-12-30 DIAGNOSIS — M6281 Muscle weakness (generalized): Secondary | ICD-10-CM | POA: Diagnosis not present

## 2017-12-30 DIAGNOSIS — D518 Other vitamin B12 deficiency anemias: Secondary | ICD-10-CM | POA: Diagnosis not present

## 2017-12-30 DIAGNOSIS — N400 Enlarged prostate without lower urinary tract symptoms: Secondary | ICD-10-CM | POA: Diagnosis not present

## 2017-12-30 DIAGNOSIS — I209 Angina pectoris, unspecified: Secondary | ICD-10-CM | POA: Diagnosis not present

## 2017-12-31 DIAGNOSIS — F333 Major depressive disorder, recurrent, severe with psychotic symptoms: Secondary | ICD-10-CM | POA: Diagnosis not present

## 2017-12-31 DIAGNOSIS — N400 Enlarged prostate without lower urinary tract symptoms: Secondary | ICD-10-CM | POA: Diagnosis not present

## 2017-12-31 DIAGNOSIS — I209 Angina pectoris, unspecified: Secondary | ICD-10-CM | POA: Diagnosis not present

## 2017-12-31 DIAGNOSIS — D518 Other vitamin B12 deficiency anemias: Secondary | ICD-10-CM | POA: Diagnosis not present

## 2017-12-31 DIAGNOSIS — M6281 Muscle weakness (generalized): Secondary | ICD-10-CM | POA: Diagnosis not present

## 2017-12-31 DIAGNOSIS — I2782 Chronic pulmonary embolism: Secondary | ICD-10-CM | POA: Diagnosis not present

## 2017-12-31 DIAGNOSIS — R1310 Dysphagia, unspecified: Secondary | ICD-10-CM | POA: Diagnosis not present

## 2017-12-31 DIAGNOSIS — J449 Chronic obstructive pulmonary disease, unspecified: Secondary | ICD-10-CM | POA: Diagnosis not present

## 2017-12-31 DIAGNOSIS — R159 Full incontinence of feces: Secondary | ICD-10-CM | POA: Diagnosis not present

## 2018-01-03 DIAGNOSIS — J449 Chronic obstructive pulmonary disease, unspecified: Secondary | ICD-10-CM | POA: Diagnosis not present

## 2018-01-03 DIAGNOSIS — I2782 Chronic pulmonary embolism: Secondary | ICD-10-CM | POA: Diagnosis not present

## 2018-01-03 DIAGNOSIS — R159 Full incontinence of feces: Secondary | ICD-10-CM | POA: Diagnosis not present

## 2018-01-03 DIAGNOSIS — D518 Other vitamin B12 deficiency anemias: Secondary | ICD-10-CM | POA: Diagnosis not present

## 2018-01-03 DIAGNOSIS — R1310 Dysphagia, unspecified: Secondary | ICD-10-CM | POA: Diagnosis not present

## 2018-01-03 DIAGNOSIS — M6281 Muscle weakness (generalized): Secondary | ICD-10-CM | POA: Diagnosis not present

## 2018-01-03 DIAGNOSIS — N390 Urinary tract infection, site not specified: Secondary | ICD-10-CM | POA: Diagnosis not present

## 2018-01-03 DIAGNOSIS — I209 Angina pectoris, unspecified: Secondary | ICD-10-CM | POA: Diagnosis not present

## 2018-01-03 DIAGNOSIS — E559 Vitamin D deficiency, unspecified: Secondary | ICD-10-CM | POA: Diagnosis not present

## 2018-01-09 DIAGNOSIS — J168 Pneumonia due to other specified infectious organisms: Secondary | ICD-10-CM | POA: Diagnosis not present

## 2018-01-09 DIAGNOSIS — J984 Other disorders of lung: Secondary | ICD-10-CM | POA: Diagnosis not present

## 2018-01-09 DIAGNOSIS — R0600 Dyspnea, unspecified: Secondary | ICD-10-CM | POA: Diagnosis not present

## 2018-01-09 DIAGNOSIS — Z66 Do not resuscitate: Secondary | ICD-10-CM | POA: Diagnosis not present

## 2018-01-09 DIAGNOSIS — E46 Unspecified protein-calorie malnutrition: Secondary | ICD-10-CM | POA: Diagnosis not present

## 2018-01-09 DIAGNOSIS — J690 Pneumonitis due to inhalation of food and vomit: Secondary | ICD-10-CM | POA: Diagnosis not present

## 2018-01-09 DIAGNOSIS — G301 Alzheimer's disease with late onset: Secondary | ICD-10-CM | POA: Diagnosis not present

## 2018-01-09 DIAGNOSIS — Z7189 Other specified counseling: Secondary | ICD-10-CM | POA: Diagnosis not present

## 2018-01-09 DIAGNOSIS — F0280 Dementia in other diseases classified elsewhere without behavioral disturbance: Secondary | ICD-10-CM | POA: Diagnosis not present

## 2018-01-09 DIAGNOSIS — Z86711 Personal history of pulmonary embolism: Secondary | ICD-10-CM | POA: Diagnosis not present

## 2018-01-09 DIAGNOSIS — R0603 Acute respiratory distress: Secondary | ICD-10-CM | POA: Diagnosis not present

## 2018-01-09 DIAGNOSIS — Z7409 Other reduced mobility: Secondary | ICD-10-CM | POA: Diagnosis not present

## 2018-01-09 DIAGNOSIS — E870 Hyperosmolality and hypernatremia: Secondary | ICD-10-CM | POA: Diagnosis not present

## 2018-01-09 DIAGNOSIS — R1312 Dysphagia, oropharyngeal phase: Secondary | ICD-10-CM | POA: Diagnosis not present

## 2018-01-09 DIAGNOSIS — E876 Hypokalemia: Secondary | ICD-10-CM | POA: Diagnosis not present

## 2018-01-09 DIAGNOSIS — J181 Lobar pneumonia, unspecified organism: Secondary | ICD-10-CM | POA: Diagnosis not present

## 2018-01-09 DIAGNOSIS — J90 Pleural effusion, not elsewhere classified: Secondary | ICD-10-CM | POA: Diagnosis not present

## 2018-01-09 DIAGNOSIS — J9811 Atelectasis: Secondary | ICD-10-CM | POA: Diagnosis not present

## 2018-01-09 DIAGNOSIS — Z515 Encounter for palliative care: Secondary | ICD-10-CM | POA: Diagnosis not present

## 2018-01-09 DIAGNOSIS — J9601 Acute respiratory failure with hypoxia: Secondary | ICD-10-CM | POA: Diagnosis not present

## 2018-01-09 DIAGNOSIS — J189 Pneumonia, unspecified organism: Secondary | ICD-10-CM | POA: Diagnosis not present

## 2018-01-09 DIAGNOSIS — R079 Chest pain, unspecified: Secondary | ICD-10-CM | POA: Diagnosis not present

## 2018-01-09 DIAGNOSIS — R918 Other nonspecific abnormal finding of lung field: Secondary | ICD-10-CM | POA: Diagnosis not present

## 2018-01-09 DIAGNOSIS — R531 Weakness: Secondary | ICD-10-CM | POA: Diagnosis not present

## 2018-01-09 DIAGNOSIS — I2699 Other pulmonary embolism without acute cor pulmonale: Secondary | ICD-10-CM | POA: Diagnosis not present

## 2018-01-09 DIAGNOSIS — R0902 Hypoxemia: Secondary | ICD-10-CM | POA: Diagnosis not present

## 2018-01-09 DIAGNOSIS — R339 Retention of urine, unspecified: Secondary | ICD-10-CM | POA: Diagnosis not present

## 2018-01-09 DIAGNOSIS — F333 Major depressive disorder, recurrent, severe with psychotic symptoms: Secondary | ICD-10-CM | POA: Diagnosis not present

## 2018-01-09 DIAGNOSIS — R0602 Shortness of breath: Secondary | ICD-10-CM | POA: Diagnosis not present

## 2018-01-09 DIAGNOSIS — Z87891 Personal history of nicotine dependence: Secondary | ICD-10-CM | POA: Diagnosis not present

## 2018-01-09 DIAGNOSIS — J811 Chronic pulmonary edema: Secondary | ICD-10-CM | POA: Diagnosis not present

## 2018-03-13 ENCOUNTER — Telehealth: Payer: Self-pay | Admitting: Nurse Practitioner

## 2018-03-13 NOTE — Telephone Encounter (Signed)
NEEDS AWV

## 2019-04-18 IMAGING — MR MR HEAD W/O CM
8 of 10 series · 41 of 48 positions shown · non-contrast
Comparison: Head CT from yesterday

CLINICAL DATA: Altered mental status with confusion and slurred
speech.

EXAM:
MRI HEAD WITHOUT CONTRAST
TECHNIQUE: Multiplanar, multiecho pulse sequences of the brain and surrounding
structures were obtained without intravenous contrast.

[Series 5: DWI · axial · 3.0mm · 0.82mm/px · z∈[-75,+81]mm · 7 of 55 slices shown (1 of 4)]
[im 1/55]
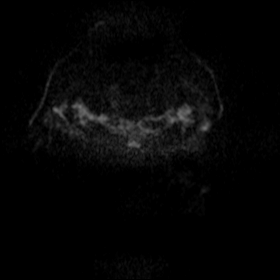
[im 10/55]
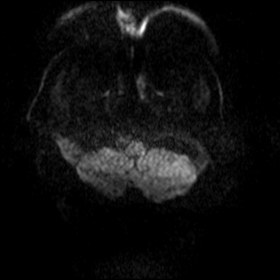
[im 19/55]
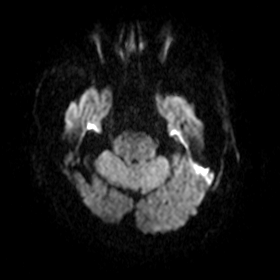
[im 28/55]
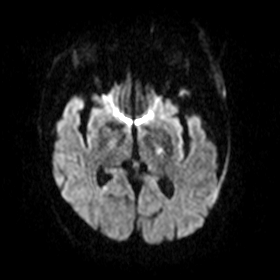
[im 37/55]
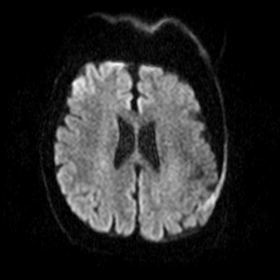
[im 46/55]
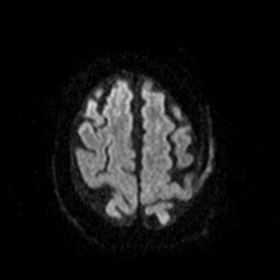
[im 55/55]
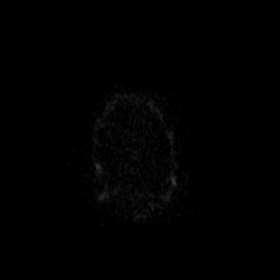

[Series 6: DWI · axial · 3.0mm · 0.82mm/px · z∈[-75,+81]mm · 7 of 55 slices shown (2 of 4)]
[im 1/55]
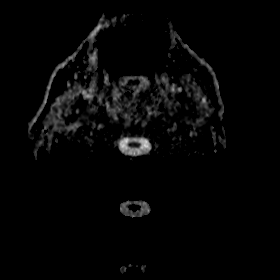
[im 10/55]
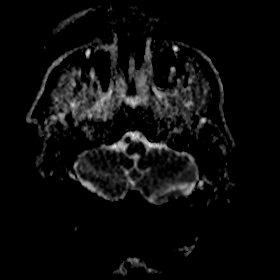
[im 19/55]
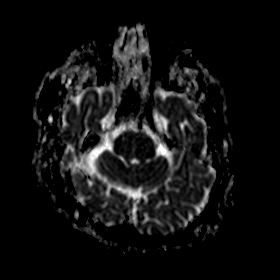
[im 28/55]
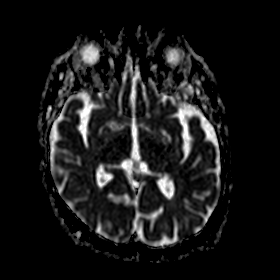
[im 37/55]
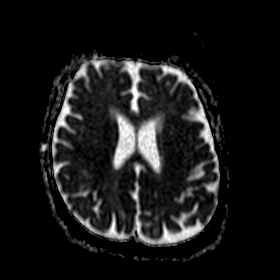
[im 46/55]
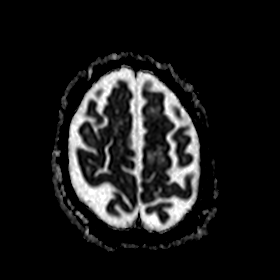
[im 55/55]
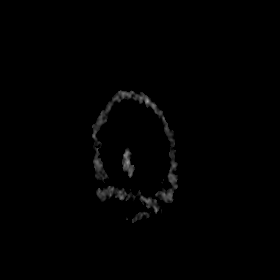

[Series 7: DWI · coronal · 5.0mm · 0.60mm/px · 4 of 34 slices shown (3 of 4)]
[im 1/34]
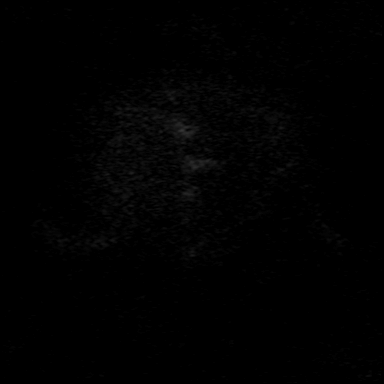
[im 12/34]
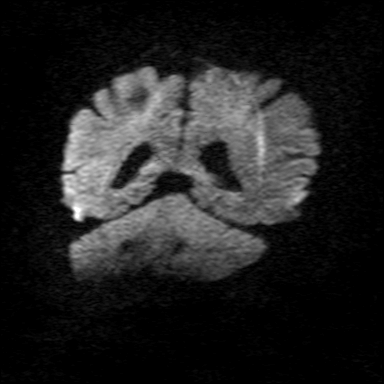
[im 23/34]
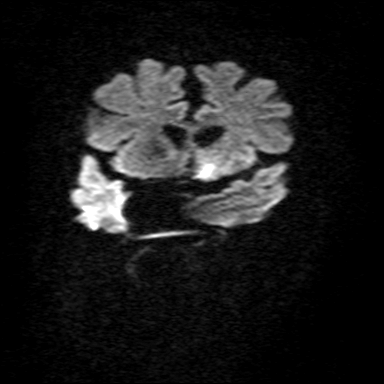
[im 34/34]
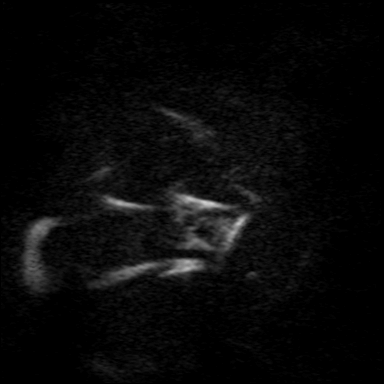

[Series 8: DWI · coronal · 5.0mm · 0.60mm/px · 4 of 34 slices shown (4 of 4)]
[im 1/34]
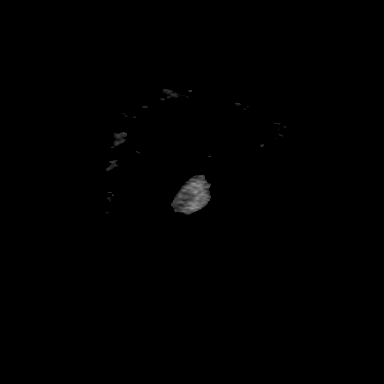
[im 12/34]
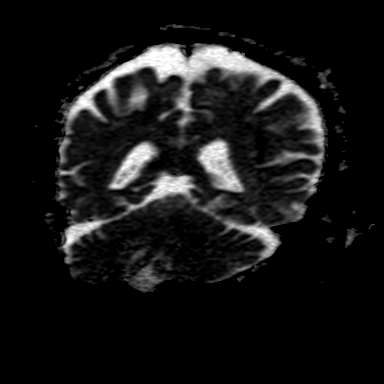
[im 23/34]
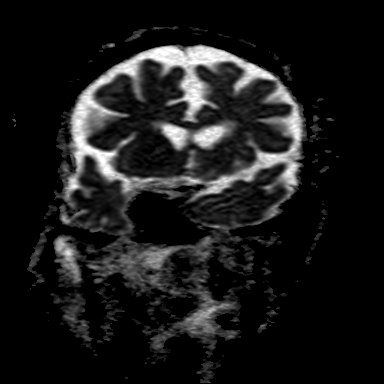
[im 34/34]
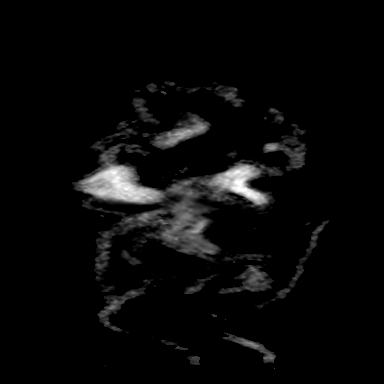

[Series 10: T2 · axial · 5.0mm · 0.75mm/px · z∈[-64,+73]mm · 3 of 23 slices shown (1 of 2)]
[im 1/23]
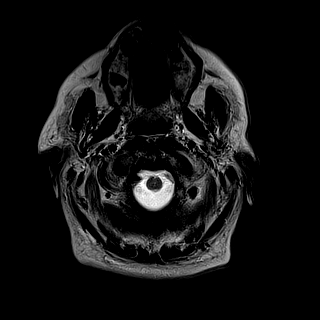
[im 12/23]
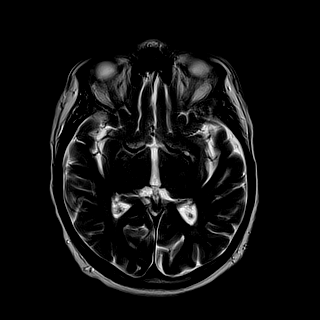
[im 23/23]
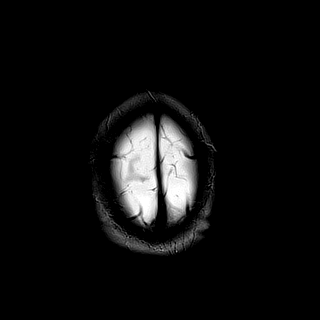

[Series 11: FLAIR · axial · 3.0mm · 0.94mm/px · z∈[-62,+71]mm · 5 of 47 slices shown]
[im 1/47]
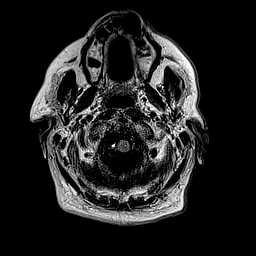
[im 12/47]
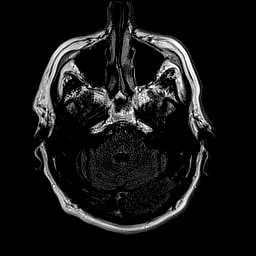
[im 24/47]
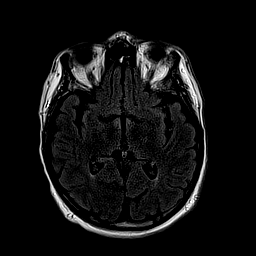
[im 35/47]
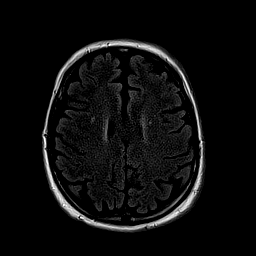
[im 47/47]
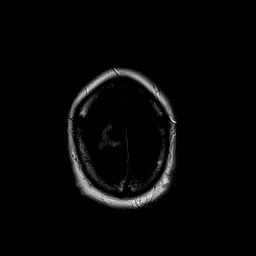

[Series 12: T1 · axial · 2.0mm · 0.47mm/px · z∈[-77,+104]mm · 8 of 95 slices shown]
[im 1/95]
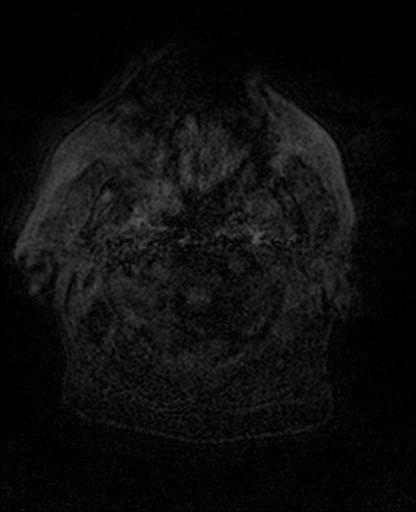
[im 19/95]
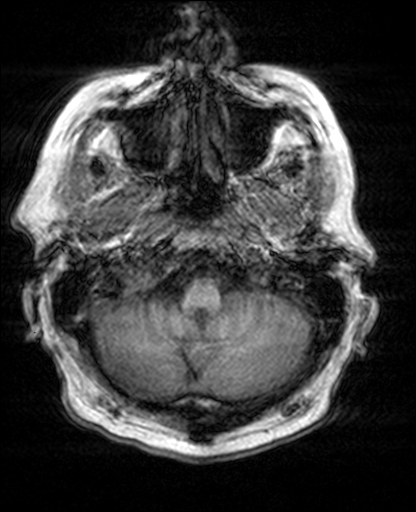
[im 29/95]
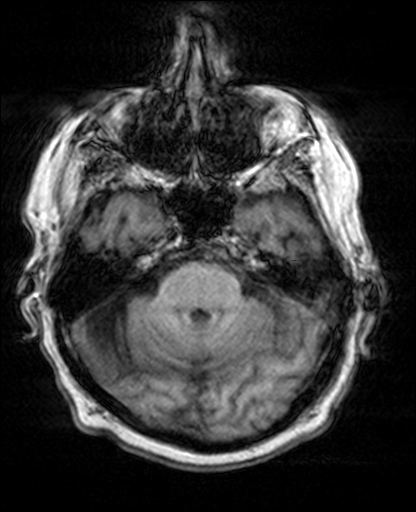
[im 38/95]
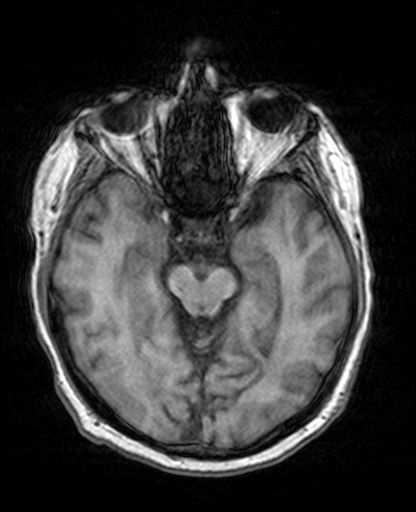
[im 57/95]
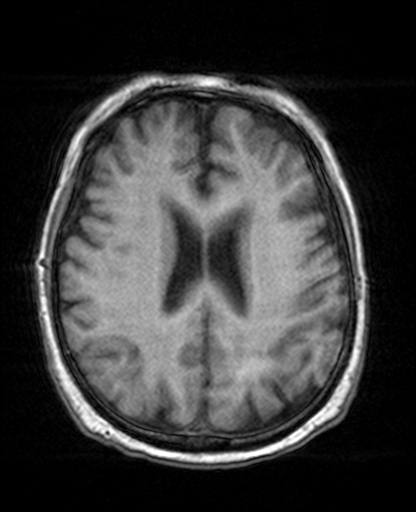
[im 66/95]
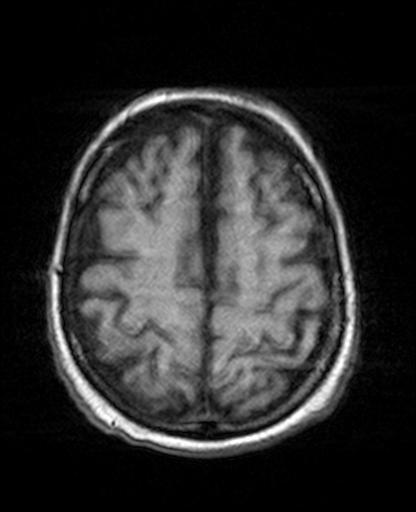
[im 76/95]
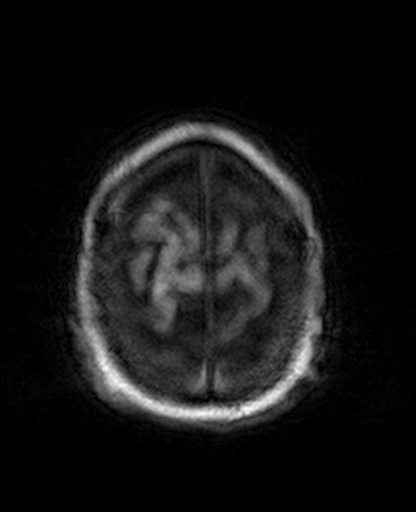
[im 95/95]
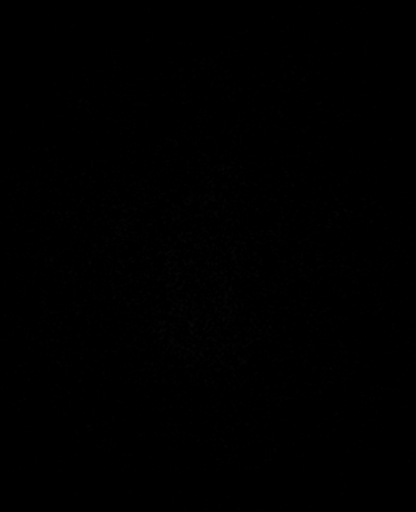

[Series 14: T2 · coronal · 5.0mm · 0.63mm/px · 3 of 28 slices shown (2 of 2)]
[im 1/28]
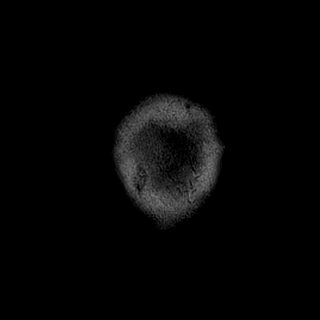
[im 14/28]
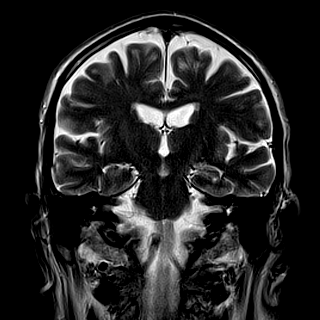
[im 28/28]
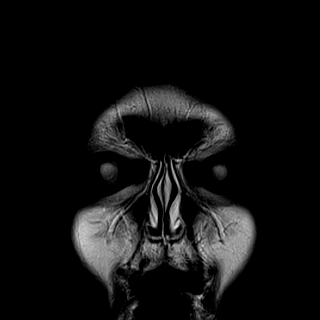

[41 of 48 positions shown; findings below may reference images not displayed]

FINDINGS: Brain: No acute infarction, hemorrhage, hydrocephalus, extra-axial
collection or mass lesion. Remote lacunar infarct in the left
caudate. Overall mild microvascular ischemic type gliosis in the
cerebral white matter. Age normal brain volume.

Vascular: Major flow voids are preserved.

Skull and upper cervical spine: Negative for marrow lesion

Sinuses/Orbits: Negative

Other: Intermittent motion degradation, overall mild.
IMPRESSION: 1. No acute finding.
2. Remote lacunar infarct of the left caudate.

## 2022-07-31 ENCOUNTER — Encounter: Payer: Self-pay | Admitting: *Deleted
